# Patient Record
Sex: Male | Born: 1935 | Race: White | Hispanic: No | Marital: Married | Smoking: Former smoker
Health system: Southern US, Community
[De-identification: ages and names within clinical notes are randomized; demographics above are authoritative.]

## PROBLEM LIST (undated history)

## (undated) DIAGNOSIS — K30 Functional dyspepsia: Secondary | ICD-10-CM

## (undated) DIAGNOSIS — H269 Unspecified cataract: Secondary | ICD-10-CM

## (undated) DIAGNOSIS — C801 Malignant (primary) neoplasm, unspecified: Secondary | ICD-10-CM

## (undated) DIAGNOSIS — C61 Malignant neoplasm of prostate: Secondary | ICD-10-CM

## (undated) DIAGNOSIS — M25569 Pain in unspecified knee: Secondary | ICD-10-CM

## (undated) DIAGNOSIS — R32 Unspecified urinary incontinence: Secondary | ICD-10-CM

## (undated) DIAGNOSIS — M25559 Pain in unspecified hip: Secondary | ICD-10-CM

## (undated) DIAGNOSIS — M1990 Unspecified osteoarthritis, unspecified site: Secondary | ICD-10-CM

## (undated) DIAGNOSIS — N529 Male erectile dysfunction, unspecified: Secondary | ICD-10-CM

## (undated) DIAGNOSIS — R609 Edema, unspecified: Secondary | ICD-10-CM

## (undated) DIAGNOSIS — H409 Unspecified glaucoma: Secondary | ICD-10-CM

## (undated) DIAGNOSIS — Z87448 Personal history of other diseases of urinary system: Secondary | ICD-10-CM

## (undated) DIAGNOSIS — F419 Anxiety disorder, unspecified: Secondary | ICD-10-CM

## (undated) HISTORY — DX: Unspecified urinary incontinence: R32

## (undated) HISTORY — DX: Personal history of other diseases of urinary system: Z87.448

## (undated) HISTORY — DX: Male erectile dysfunction, unspecified: N52.9

## (undated) HISTORY — DX: Pain in unspecified knee: M25.569

## (undated) HISTORY — DX: Functional dyspepsia: K30

## (undated) HISTORY — DX: Unspecified cataract: H26.9

## (undated) HISTORY — DX: Anxiety disorder, unspecified: F41.9

## (undated) HISTORY — PX: PROSTATE SURGERY: SHX751

## (undated) HISTORY — DX: Unspecified glaucoma: H40.9

## (undated) HISTORY — DX: Pain in unspecified hip: M25.559

## (undated) HISTORY — DX: Malignant neoplasm of prostate: C61

## (undated) HISTORY — PX: HERNIA REPAIR: SHX51

## (undated) HISTORY — PX: VASECTOMY: SHX75

## (undated) HISTORY — DX: Edema, unspecified: R60.9

---

## 1966-09-25 HISTORY — PX: FRACTURE SURGERY: SHX138

## 1972-09-25 HISTORY — PX: VASECTOMY: SHX75

## 1991-09-26 HISTORY — PX: HERNIA REPAIR: SHX51

## 1995-09-26 HISTORY — PX: SUPRAPUBIC PROSTATECTOMY: SUR1056

## 2007-09-26 DIAGNOSIS — Z9889 Other specified postprocedural states: Secondary | ICD-10-CM

## 2007-09-26 HISTORY — PX: GALLBLADDER SURGERY: SHX652

## 2007-09-26 HISTORY — DX: Other specified postprocedural states: Z98.890

## 2008-03-26 ENCOUNTER — Ambulatory Visit (HOSPITAL_COMMUNITY): Admission: RE | Admit: 2008-03-26 | Discharge: 2008-03-26 | Payer: Self-pay | Admitting: Pulmonary Disease

## 2008-04-10 ENCOUNTER — Ambulatory Visit: Payer: Self-pay | Admitting: Internal Medicine

## 2008-04-20 ENCOUNTER — Encounter: Payer: Self-pay | Admitting: Internal Medicine

## 2008-04-20 ENCOUNTER — Ambulatory Visit: Payer: Self-pay | Admitting: Internal Medicine

## 2008-04-23 ENCOUNTER — Encounter: Payer: Self-pay | Admitting: Internal Medicine

## 2008-07-03 ENCOUNTER — Encounter (INDEPENDENT_AMBULATORY_CARE_PROVIDER_SITE_OTHER): Payer: Self-pay | Admitting: General Surgery

## 2008-07-03 ENCOUNTER — Ambulatory Visit (HOSPITAL_COMMUNITY): Admission: RE | Admit: 2008-07-03 | Discharge: 2008-07-03 | Payer: Self-pay | Admitting: General Surgery

## 2008-07-03 HISTORY — PX: COLONOSCOPY: SHX174

## 2008-07-03 LAB — HM COLONOSCOPY

## 2009-09-25 HISTORY — PX: CHOLECYSTECTOMY: SHX55

## 2011-02-07 NOTE — H&P (Signed)
NAME:  JIMMEY, HENGEL NO.:  [id number]   MEDICAL RECORD NO.:  [id number]          PATIENT TYPE:  AMB   LOCATION:  DAY                           FACILITY:  [facility]   PHYSICIAN:  Dalia Heading, M.D.  DATE OF BIRTH:  Apr 02, 1936   DATE OF ADMISSION:  DATE OF DISCHARGE:  LH                              HISTORY & PHYSICAL   CHIEF COMPLAINT:  Cholecystitis and cholelithiasis.   HISTORY OF PRESENT ILLNESS:  The patient is a 75 year old white male who  is referred for evaluation and treatment of biliary colic secondary to  cholelithiasis.  He has had intermittent episodes of right upper  quadrant abdominal pain with radiation to the flank, nausea, and  indigestion.  He denies any nausea, vomiting, or fatty food intolerance.  No fever, chills, or jaundice have been noted.   PAST MEDICAL HISTORY:  Includes prostate carcinoma.   PAST SURGICAL HISTORY:  Radical prostatectomy and herniorrhaphy.   CURRENT MEDICATIONS:  None.   ALLERGIES:  No known drug allergies.   REVIEW OF SYSTEMS:  Noncontributory.   PHYSICAL EXAMINATION:  GENERAL:  The patient is a well-developed, well-  nourished white male in no acute distress.  HEENT:  Reveals no scleral icterus.  LUNGS:  Clear to auscultation with equal breath sounds bilaterally.  HEART:  Regular rate and rhythm without S3, S4, or murmurs.  ABDOMEN:  Soft, nontender, and nondistended.  No hepatosplenomegaly or  masses are noted.   Ultrasound of the gallbladder reveals cholelithiasis with a normal  common bile duct.   IMPRESSION:  Cholecystitis, cholelithiasis.   PLAN:  The patient is scheduled for laparoscopic cholecystectomy on  July 03, 2008.  The risks and benefits of the procedure including  bleeding, infection, hepatobiliary injury, and the possibility of an  open procedure were fully explained to the patient, who gave informed  consent.      Dalia Heading, M.D.  Electronically Signed     MAJ/MEDQ  D:   06/23/2008  T:  06/24/2008  Job:  [id number]   cc:   Ramon Dredge L. Juanetta Gosling, M.D.  Fax: [phone]

## 2011-02-07 NOTE — Op Note (Signed)
NAME:  Kevin [name], Kevin [name] NO.:  [id number]   MEDICAL RECORD NO.:  [id number]          PATIENT TYPE:  AMB   LOCATION:  DAY                           FACILITY:  [facility]   PHYSICIAN:  Dalia Heading, M.D.  DATE OF BIRTH:  09-Jan-1936   DATE OF PROCEDURE:  07/03/2008  DATE OF DISCHARGE:                               OPERATIVE REPORT   PREOPERATIVE DIAGNOSES:  1. Cholecystitis.  2. Cholelithiasis.   POSTOPERATIVE DIAGNOSES:  1. Cholecystitis.  2. Cholelithiasis.   PROCEDURE:  Laparoscopic cholecystectomy.   SURGEON:  Dalia Heading, MD   ANESTHESIA:  General endotracheal.   INDICATIONS:  The patient is a 75 year old white male who presents with  biliary colic secondary to cholelithiasis.  The risks and benefits of  the procedure including bleeding, infection, hepatobiliary injury, the  possibility of an open procedure were fully explained to the patient,  gave informed consent.   PROCEDURE NOTE:  The patient was placed in a supine position.  After  induction of general endotracheal anesthesia, the abdomen was prepped  and draped using the usual sterile technique with Betadine.  Surgical  site confirmation was performed.   A supraumbilical incision was made down to the fascia.  A Veress needle  was introduced into the abdominal cavity and confirmation of placement  was done using the saline drop test.  The abdomen was then insufflated  to 16 mmHg pressure.  An 11-mm trocar was introduced into the abdominal  cavity under direct visualization without difficulty.  The patient was  placed in reversed Trendelenburg position.  An additional 11-mm trocar  was placed in the epigastric region.  A 5-mm trocar was placed in the  right upper quadrant and right flank regions.  Liver was inspected and  noted to be within normal limits.  The gallbladder was retracted  superior and laterally.  The dissection was begun around the  infundibulum of the gallbladder.  The cystic duct  was first identified.  Its juncture to the infundibulum fully identified.  EndoClip was placed  proximally and distally on the cystic duct and the cystic duct was  divided.  This was likewise done in cystic artery.  The gallbladder was  then freed away from the gallbladder fossa using Bovie electrocautery.  The gallbladder was delivered through the epigastric trocar site using  an EndoCatch bag.  The gallbladder fossa was inspected.  No abnormal  bleeding or bile leakage was noted.  Surgicel was placed in the  gallbladder fossa.  All fluid and air were then evacuated from the  abdominal cavity prior to removal of the trocars.   All wounds were irrigated with warm normal saline.  All wounds were  injected with 0.5% Sensorcaine.  The supraumbilical fascia as well as  epigastric fascia reapproximated using 0 Vicryl interrupted sutures.  All skin incisions were closed using staples.  Betadine ointment and dry  sterile dressings were applied.   All tape and needle counts were correct at the end of the procedure.  The patient was extubated in the operating room and went back to  recovery room awake in stable  condition.   COMPLICATIONS:  None.   SPECIMEN:  Gallbladder.   BLOOD LOSS:  Minimal.      Dalia Heading, M.D.  Electronically Signed     MAJ/MEDQ  D:  07/03/2008  T:  07/03/2008  Job:  [id number]   cc:   Ramon Dredge L. Juanetta Gosling, M.D.  Fax: [phone]

## 2011-06-09 ENCOUNTER — Encounter: Payer: Self-pay | Admitting: Internal Medicine

## 2011-06-27 LAB — HEPATIC FUNCTION PANEL
ALT: 20
Alkaline Phosphatase: 60
Indirect Bilirubin: 1.1 — ABNORMAL HIGH
Total Bilirubin: 1.3 — ABNORMAL HIGH

## 2011-06-27 LAB — BASIC METABOLIC PANEL
CO2: 29
Chloride: 103
GFR calc Af Amer: >60
Potassium: 4.2
Sodium: 140

## 2011-06-27 LAB — CBC
Hemoglobin: 16.4
MCHC: 33.9
MCV: 89.3
RBC: 5.42

## 2011-11-14 ENCOUNTER — Ambulatory Visit (HOSPITAL_COMMUNITY)
Admission: RE | Admit: 2011-11-14 | Discharge: 2011-11-14 | Disposition: A | Discharge status: Home / Self Care | Payer: Medicare Other | Source: Ambulatory Visit | Attending: Pulmonary Disease | Admitting: Pulmonary Disease

## 2011-11-14 ENCOUNTER — Other Ambulatory Visit (HOSPITAL_COMMUNITY): Payer: Self-pay | Admitting: Pulmonary Disease

## 2011-11-14 DIAGNOSIS — R52 Pain, unspecified: Secondary | ICD-10-CM

## 2011-11-14 DIAGNOSIS — M51379 Other intervertebral disc degeneration, lumbosacral region without mention of lumbar back pain or lower extremity pain: Secondary | ICD-10-CM | POA: Insufficient documentation

## 2011-11-14 DIAGNOSIS — M545 Low back pain, unspecified: Secondary | ICD-10-CM | POA: Insufficient documentation

## 2011-11-14 DIAGNOSIS — M5137 Other intervertebral disc degeneration, lumbosacral region: Secondary | ICD-10-CM | POA: Insufficient documentation

## 2012-06-19 ENCOUNTER — Encounter: Payer: Self-pay | Admitting: Internal Medicine

## 2015-09-26 DIAGNOSIS — Z9849 Cataract extraction status, unspecified eye: Secondary | ICD-10-CM

## 2015-09-26 HISTORY — DX: Cataract extraction status, unspecified eye: Z98.49

## 2015-11-09 DIAGNOSIS — H40009 Preglaucoma, unspecified, unspecified eye: Secondary | ICD-10-CM | POA: Diagnosis not present

## 2015-11-09 DIAGNOSIS — H5210 Myopia, unspecified eye: Secondary | ICD-10-CM | POA: Diagnosis not present

## 2015-12-27 DIAGNOSIS — H40013 Open angle with borderline findings, low risk, bilateral: Secondary | ICD-10-CM | POA: Diagnosis not present

## 2015-12-27 DIAGNOSIS — H2513 Age-related nuclear cataract, bilateral: Secondary | ICD-10-CM | POA: Diagnosis not present

## 2015-12-27 DIAGNOSIS — H2511 Age-related nuclear cataract, right eye: Secondary | ICD-10-CM | POA: Diagnosis not present

## 2015-12-27 DIAGNOSIS — H5213 Myopia, bilateral: Secondary | ICD-10-CM | POA: Diagnosis not present

## 2016-01-17 NOTE — Patient Instructions (Signed)
Your procedure is scheduled on: 01/20/2016  Report to [facility] at  640  AM.  Call this number if you have problems the morning of surgery: [phone]   Do not eat food or drink liquids :After Midnight.      Take these medicines the morning of surgery with A SIP OF WATER: none   Do not wear jewelry, make-up or nail polish.  Do not wear lotions, powders, or perfumes. You may wear deodorant.  Do not shave 48 hours prior to surgery.  Do not bring valuables to the hospital.  Contacts, dentures or bridgework may not be worn into surgery.  Leave suitcase in the car. After surgery it may be brought to your room.  For patients admitted to the hospital, checkout time is 11:00 AM the day of discharge.   Patients discharged the day of surgery will not be allowed to drive home.  :     Please read over the following fact sheets that you were given: Coughing and Deep Breathing, Surgical Site Infection Prevention, Anesthesia Post-op Instructions and Care and Recovery After Surgery    Cataract A cataract is a clouding of the lens of the eye. When a lens becomes cloudy, vision is reduced based on the degree and nature of the clouding. Many cataracts reduce vision to some degree. Some cataracts make people more near-sighted as they develop. Other cataracts increase glare. Cataracts that are ignored and become worse can sometimes look white. The white color can be seen through the pupil. CAUSES   Aging. However, cataracts may occur at any age, even in newborns.   Certain drugs.   Trauma to the eye.   Certain diseases such as diabetes.   Specific eye diseases such as chronic inflammation inside the eye or a sudden attack of a rare form of glaucoma.   Inherited or acquired medical problems.  SYMPTOMS   Gradual, progressive drop in vision in the affected eye.   Severe, rapid visual loss. This most often happens when trauma is the cause.  DIAGNOSIS  To detect a cataract, an eye doctor examines  the lens. Cataracts are best diagnosed with an exam of the eyes with the pupils enlarged (dilated) by drops.  TREATMENT  For an early cataract, vision may improve by using different eyeglasses or stronger lighting. If that does not help your vision, surgery is the only effective treatment. A cataract needs to be surgically removed when vision loss interferes with your everyday activities, such as driving, reading, or watching TV. A cataract may also have to be removed if it prevents examination or treatment of another eye problem. Surgery removes the cloudy lens and usually replaces it with a substitute lens (intraocular lens, IOL).  At a time when both you and your doctor agree, the cataract will be surgically removed. If you have cataracts in both eyes, only one is usually removed at a time. This allows the operated eye to heal and be out of danger from any possible problems after surgery (such as infection or poor wound healing). In rare cases, a cataract may be doing damage to your eye. In these cases, your caregiver may advise surgical removal right away. The vast majority of people who have cataract surgery have better vision afterward. HOME CARE INSTRUCTIONS  If you are not planning surgery, you may be asked to do the following:  Use different eyeglasses.   Use stronger or brighter lighting.   Ask your eye doctor about reducing your medicine dose or  changing medicines if it is thought that a medicine caused your cataract. Changing medicines does not make the cataract go away on its own.   Become familiar with your surroundings. Poor vision can lead to injury. Avoid bumping into things on the affected side. You are at a higher risk for tripping or falling.   Exercise extreme care when driving or operating machinery.   Wear sunglasses if you are sensitive to bright light or experiencing problems with glare.  SEEK IMMEDIATE MEDICAL CARE IF:   You have a worsening or sudden vision loss.    You notice redness, swelling, or increasing pain in the eye.   You have a fever.  Document Released: 09/11/2005 Document Revised: 08/31/2011 Document Reviewed: 05/05/2011 [facility] Patient Information 2012 [place].PATIENT INSTRUCTIONS POST-ANESTHESIA  IMMEDIATELY FOLLOWING SURGERY:  Do not drive or operate machinery for the first twenty four hours after surgery.  Do not make any important decisions for twenty four hours after surgery or while taking narcotic pain medications or sedatives.  If you develop intractable nausea and vomiting or a severe headache please notify your doctor immediately.  FOLLOW-UP:  Please make an appointment with your surgeon as instructed. You do not need to follow up with anesthesia unless specifically instructed to do so.  WOUND CARE INSTRUCTIONS (if applicable):  Keep a dry clean dressing on the anesthesia/puncture wound site if there is drainage.  Once the wound has quit draining you may leave it open to air.  Generally you should leave the bandage intact for twenty four hours unless there is drainage.  If the epidural site drains for more than 36-48 hours please call the anesthesia department.  QUESTIONS?:  Please feel free to call your physician or the hospital operator if you have any questions, and they will be happy to assist you.

## 2016-01-18 ENCOUNTER — Encounter (HOSPITAL_COMMUNITY)
Admission: RE | Admit: 2016-01-18 | Discharge: 2016-01-18 | Disposition: A | Discharge status: Home / Self Care | Payer: Medicare HMO | Source: Ambulatory Visit | Attending: Ophthalmology | Admitting: Ophthalmology

## 2016-01-18 ENCOUNTER — Other Ambulatory Visit: Payer: Self-pay

## 2016-01-18 ENCOUNTER — Encounter (HOSPITAL_COMMUNITY): Payer: Self-pay

## 2016-01-18 DIAGNOSIS — Z8546 Personal history of malignant neoplasm of prostate: Secondary | ICD-10-CM | POA: Diagnosis not present

## 2016-01-18 DIAGNOSIS — H2511 Age-related nuclear cataract, right eye: Secondary | ICD-10-CM | POA: Diagnosis not present

## 2016-01-18 DIAGNOSIS — Z01812 Encounter for preprocedural laboratory examination: Secondary | ICD-10-CM | POA: Diagnosis not present

## 2016-01-18 DIAGNOSIS — Z87891 Personal history of nicotine dependence: Secondary | ICD-10-CM | POA: Diagnosis not present

## 2016-01-18 DIAGNOSIS — Z01810 Encounter for preprocedural cardiovascular examination: Secondary | ICD-10-CM | POA: Diagnosis not present

## 2016-01-18 HISTORY — DX: Malignant (primary) neoplasm, unspecified: C80.1

## 2016-01-18 HISTORY — DX: Unspecified osteoarthritis, unspecified site: M19.90

## 2016-01-18 LAB — BASIC METABOLIC PANEL
ANION GAP: 7 (ref 5–15)
BUN: 15 mg/dL (ref 6–20)
CHLORIDE: 104 mmol/L (ref 101–111)
CO2: 27 mmol/L (ref 22–32)
Calcium: 9.3 mg/dL (ref 8.9–10.3)
Creatinine, Ser: 1.12 mg/dL (ref 0.61–1.24)
Glucose, Bld: 107 mg/dL — ABNORMAL HIGH (ref 65–99)
POTASSIUM: 4.2 mmol/L (ref 3.5–5.1)
SODIUM: 138 mmol/L (ref 135–145)

## 2016-01-18 LAB — CBC WITH DIFFERENTIAL/PLATELET
BASOS ABS: 0 10*3/uL (ref 0.0–0.1)
BASOS PCT: 1 %
Eosinophils Absolute: 0.1 10*3/uL (ref 0.0–0.7)
Eosinophils Relative: 1 %
HEMATOCRIT: 45.3 % (ref 39.0–52.0)
HEMOGLOBIN: 16 g/dL (ref 13.0–17.0)
LYMPHS PCT: 35 %
Lymphs Abs: 2.2 10*3/uL (ref 0.7–4.0)
MCH: 31.2 pg (ref 26.0–34.0)
MCHC: 35.3 g/dL (ref 30.0–36.0)
MCV: 88.3 fL (ref 78.0–100.0)
MONO ABS: 0.7 10*3/uL (ref 0.1–1.0)
MONOS PCT: 12 %
NEUTROS ABS: 3.3 10*3/uL (ref 1.7–7.7)
NEUTROS PCT: 52 %
Platelets: 163 10*3/uL (ref 150–400)
RBC: 5.13 MIL/uL (ref 4.22–5.81)
RDW: 12.4 % (ref 11.5–15.5)
WBC: 6.3 10*3/uL (ref 4.0–10.5)

## 2016-01-18 NOTE — Pre-Procedure Instructions (Signed)
EKG shown to Dr Milford Cage. Patient is asymptomatic. No orders given. Patient given information to sign up for my chart at home.

## 2016-01-20 ENCOUNTER — Ambulatory Visit (HOSPITAL_COMMUNITY): Payer: Medicare HMO | Admitting: Anesthesiology

## 2016-01-20 ENCOUNTER — Encounter (HOSPITAL_COMMUNITY): Payer: Self-pay

## 2016-01-20 ENCOUNTER — Encounter (HOSPITAL_COMMUNITY)
Admission: RE | Disposition: A | Discharge status: Home / Self Care | Payer: Self-pay | Source: Ambulatory Visit | Attending: Ophthalmology

## 2016-01-20 ENCOUNTER — Ambulatory Visit (HOSPITAL_COMMUNITY)
Admission: RE | Admit: 2016-01-20 | Discharge: 2016-01-20 | Disposition: A | Discharge status: Home / Self Care | Payer: Medicare HMO | Source: Ambulatory Visit | Attending: Ophthalmology | Admitting: Ophthalmology

## 2016-01-20 DIAGNOSIS — Z01812 Encounter for preprocedural laboratory examination: Secondary | ICD-10-CM | POA: Diagnosis not present

## 2016-01-20 DIAGNOSIS — Z01810 Encounter for preprocedural cardiovascular examination: Secondary | ICD-10-CM | POA: Insufficient documentation

## 2016-01-20 DIAGNOSIS — H2511 Age-related nuclear cataract, right eye: Secondary | ICD-10-CM | POA: Insufficient documentation

## 2016-01-20 DIAGNOSIS — Z8546 Personal history of malignant neoplasm of prostate: Secondary | ICD-10-CM | POA: Insufficient documentation

## 2016-01-20 DIAGNOSIS — Z87891 Personal history of nicotine dependence: Secondary | ICD-10-CM | POA: Insufficient documentation

## 2016-01-20 DIAGNOSIS — H269 Unspecified cataract: Secondary | ICD-10-CM | POA: Diagnosis not present

## 2016-01-20 HISTORY — PX: CATARACT EXTRACTION W/PHACO: SHX586

## 2016-01-20 SURGERY — PHACOEMULSIFICATION, CATARACT, WITH IOL INSERTION
Anesthesia: Monitor Anesthesia Care | Site: Eye | Laterality: Right

## 2016-01-20 MED ORDER — LIDOCAINE HCL 3.5 % OP GEL
1.0000 "application " | Freq: Once | OPHTHALMIC | Status: AC
  Administered 2016-01-20: 1 via OPHTHALMIC

## 2016-01-20 MED ORDER — EPINEPHRINE HCL 1 MG/ML IJ SOLN
INTRAMUSCULAR | Status: AC
  Filled 2016-01-20: qty 1

## 2016-01-20 MED ORDER — FENTANYL CITRATE (PF) 100 MCG/2ML IJ SOLN
25.0000 ug | INTRAMUSCULAR | Status: AC
  Administered 2016-01-20 (×2): 25 ug via INTRAVENOUS

## 2016-01-20 MED ORDER — TETRACAINE HCL 0.5 % OP SOLN
1.0000 [drp] | OPHTHALMIC | Status: AC
  Administered 2016-01-20 (×3): 1 [drp] via OPHTHALMIC

## 2016-01-20 MED ORDER — NEOMYCIN-POLYMYXIN-DEXAMETH 3.5-10000-0.1 OP SUSP
OPHTHALMIC | Status: DC | PRN
  Administered 2016-01-20: 2 [drp] via OPHTHALMIC

## 2016-01-20 MED ORDER — FENTANYL CITRATE (PF) 100 MCG/2ML IJ SOLN
INTRAMUSCULAR | Status: AC
  Filled 2016-01-20: qty 2

## 2016-01-20 MED ORDER — MIDAZOLAM HCL 2 MG/2ML IJ SOLN
INTRAMUSCULAR | Status: AC
  Filled 2016-01-20: qty 2

## 2016-01-20 MED ORDER — LACTATED RINGERS IV SOLN
INTRAVENOUS | Status: DC
  Administered 2016-01-20: 08:00:00 via INTRAVENOUS

## 2016-01-20 MED ORDER — LIDOCAINE HCL (PF) 1 % IJ SOLN
INTRAMUSCULAR | Status: DC | PRN
  Administered 2016-01-20: .6 mL

## 2016-01-20 MED ORDER — CYCLOPENTOLATE-PHENYLEPHRINE 0.2-1 % OP SOLN
1.0000 [drp] | OPHTHALMIC | Status: AC
  Administered 2016-01-20 (×3): 1 [drp] via OPHTHALMIC

## 2016-01-20 MED ORDER — MIDAZOLAM HCL 2 MG/2ML IJ SOLN
1.0000 mg | INTRAMUSCULAR | Status: DC | PRN
  Administered 2016-01-20: 2 mg via INTRAVENOUS

## 2016-01-20 MED ORDER — PHENYLEPHRINE HCL 2.5 % OP SOLN
1.0000 [drp] | OPHTHALMIC | Status: AC
  Administered 2016-01-20 (×3): 1 [drp] via OPHTHALMIC

## 2016-01-20 MED ORDER — BSS IO SOLN
INTRAOCULAR | Status: DC | PRN
  Administered 2016-01-20: 15 mL

## 2016-01-20 MED ORDER — EPINEPHRINE HCL 1 MG/ML IJ SOLN
INTRAOCULAR | Status: DC | PRN
  Administered 2016-01-20: 500 mL

## 2016-01-20 MED ORDER — PROVISC 10 MG/ML IO SOLN
INTRAOCULAR | Status: DC | PRN
  Administered 2016-01-20: 0.85 mL via INTRAOCULAR

## 2016-01-20 MED ORDER — POVIDONE-IODINE 5 % OP SOLN
OPHTHALMIC | Status: DC | PRN
  Administered 2016-01-20: 1 via OPHTHALMIC

## 2016-01-20 SURGICAL SUPPLY — 23 items
CAPSULAR TENSION RING-AMO (OPHTHALMIC RELATED) IMPLANT
CLOTH BEACON ORANGE TIMEOUT ST (SAFETY) ×2 IMPLANT
EYE SHIELD UNIVERSAL CLEAR (GAUZE/BANDAGES/DRESSINGS) ×2 IMPLANT
GLOVE BIOGEL PI IND STRL 7.0 (GLOVE) ×1 IMPLANT
GLOVE BIOGEL PI IND STRL 7.5 (GLOVE) IMPLANT
GLOVE BIOGEL PI INDICATOR 7.0 (GLOVE) ×1
GLOVE BIOGEL PI INDICATOR 7.5 (GLOVE)
GLOVE EXAM NITRILE LRG STRL (GLOVE) IMPLANT
GLOVE EXAM NITRILE MD LF STRL (GLOVE) ×2 IMPLANT
KIT VITRECTOMY (OPHTHALMIC RELATED) IMPLANT
PAD ARMBOARD 7.5X6 YLW CONV (MISCELLANEOUS) ×2 IMPLANT
PROC W NO LENS (INTRAOCULAR LENS)
PROC W SPEC LENS (INTRAOCULAR LENS)
PROCESS W NO LENS (INTRAOCULAR LENS) IMPLANT
PROCESS W SPEC LENS (INTRAOCULAR LENS) IMPLANT
RETRACTOR IRIS SIGHTPATH (OPHTHALMIC RELATED) IMPLANT
RING MALYGIN (MISCELLANEOUS) IMPLANT
SIGHTPATH CAT PROC W REG LENS (Ophthalmic Related) ×2 IMPLANT
SYRINGE LUER LOK 1CC (MISCELLANEOUS) ×2 IMPLANT
TAPE SURG TRANSPORE 1 IN (GAUZE/BANDAGES/DRESSINGS) ×1 IMPLANT
TAPE SURGICAL TRANSPORE 1 IN (GAUZE/BANDAGES/DRESSINGS) ×1
VISCOELASTIC ADDITIONAL (OPHTHALMIC RELATED) IMPLANT
WATER STERILE IRR 250ML POUR (IV SOLUTION) ×2 IMPLANT

## 2016-01-20 NOTE — Discharge Instructions (Signed)

## 2016-01-20 NOTE — Anesthesia Preprocedure Evaluation (Signed)
Anesthesia Evaluation  Patient identified by MRN, date of birth, ID band Patient awake    Reviewed: Allergy & Precautions, NPO status , Patient's Chart, lab work & pertinent test results  Airway Mallampati: II  TM Distance: >3 FB Neck ROM: Full    Dental  (+) Implants   Pulmonary former smoker,    breath sounds clear to auscultation       Cardiovascular negative cardio ROS  + dysrhythmias (PVC's noted on EKG.)  Rhythm:Regular Rate:Normal     Neuro/Psych    GI/Hepatic negative GI ROS,   Endo/Other    Renal/GU      Musculoskeletal   Abdominal   Peds  Hematology   Anesthesia Other Findings Hx prostate cancer  Reproductive/Obstetrics                             Anesthesia Physical Anesthesia Plan  ASA: II  Anesthesia Plan: MAC   Post-op Pain Management:    Induction: Intravenous  Airway Management Planned: Nasal Cannula  Additional Equipment:   Intra-op Plan:   Post-operative Plan:   Informed Consent: I have reviewed the patients History and Physical, chart, labs and discussed the procedure including the risks, benefits and alternatives for the proposed anesthesia with the patient or authorized representative who has indicated his/her understanding and acceptance.     Plan Discussed with:   Anesthesia Plan Comments:         Anesthesia Quick Evaluation

## 2016-01-20 NOTE — Anesthesia Postprocedure Evaluation (Signed)
  Anesthesia Post-op Note  Patient: Kevin [name]  Procedure(s) Performed: Procedure(s) (LRB): CATARACT EXTRACTION PHACO AND INTRAOCULAR LENS PLACEMENT (IOC) (Right)  Patient Location:  Short Stay  Anesthesia Type: MAC  Level of Consciousness: awake  Airway and Oxygen Therapy: Patient Spontanous Breathing  Post-op Pain: none  Post-op Assessment: Post-op Vital signs reviewed, Patient's Cardiovascular Status Stable, Respiratory Function Stable, Patent Airway, No signs of Nausea or vomiting and Pain level controlled  Post-op Vital Signs: Reviewed and stable  Complications: No apparent anesthesia complications

## 2016-01-20 NOTE — Op Note (Signed)
Date of Admission: 01/20/2016  Date of Surgery: 01/20/2016   Pre-Op Dx: Cataract Right Eye  Post-Op Dx: Senile Nuclear Cataract Right  Eye,  Dx Code H25.11  Surgeon: Tonny Branch, M.D.  Assistants: None  Anesthesia: Topical with MAC  Indications: Painless, progressive loss of vision with compromise of daily activities.  Surgery: Cataract Extraction with Intraocular lens Implant Right Eye  Discription: The patient had dilating drops and viscous lidocaine placed into the Right eye in the pre-op holding area. After transfer to the operating room, a time out was performed. The patient was then prepped and draped. Beginning with a 4 degree blade a paracentesis port was made at the surgeon's 2 o'clock position. The anterior chamber was then filled with 1% non-preserved lidocaine. This was followed by filling the anterior chamber with Provisc.  A 2.56mm keratome blade was used to make a clear corneal incision at the temporal limbus.  A bent cystatome needle was used to create a continuous tear capsulotomy. Hydrodissection was performed with balanced salt solution on a Fine canula. The lens nucleus was then removed using the phacoemulsification handpiece. Residual cortex was removed with the I&A handpiece. The anterior chamber and capsular bag were refilled with Provisc. A posterior chamber intraocular lens was placed into the capsular bag with it's injector. The implant was positioned with the Kuglan hook. The Provisc was then removed from the anterior chamber and capsular bag with the I&A handpiece. Stromal hydration of the main incision and paracentesis port was performed with BSS on a Fine canula. The wounds were tested for leak which was negative. The patient tolerated the procedure well. There were no operative complications. The patient was then transferred to the recovery room in stable condition.  Complications: None  Specimen: None  EBL: None  Prosthetic device: Hoya iSert 250, power 14.5 D,  SN [id number].

## 2016-01-20 NOTE — Transfer of Care (Signed)
Immediate Anesthesia Transfer of Care Note  Patient: Kevin [name]  Procedure(s) Performed: Procedure(s) (LRB): CATARACT EXTRACTION PHACO AND INTRAOCULAR LENS PLACEMENT (IOC) (Right)  Patient Location: Shortstay  Anesthesia Type: MAC  Level of Consciousness: awake  Airway & Oxygen Therapy: Patient Spontanous Breathing   Post-op Assessment: Report given to PACU RN, Post -op Vital signs reviewed and stable and Patient moving all extremities  Post vital signs: Reviewed and stable  Complications: No apparent anesthesia complications

## 2016-01-20 NOTE — Anesthesia Procedure Notes (Signed)
Procedure Name: MAC Date/Time: 01/20/2016 8:42 AM Performed by: Vista Deck Pre-anesthesia Checklist: Patient identified, Emergency Drugs available, Suction available, Timeout performed and Patient being monitored Patient Re-evaluated:Patient Re-evaluated prior to inductionOxygen Delivery Method: Nasal Cannula

## 2016-01-20 NOTE — H&P (Signed)
I have reviewed the H&P, the patient was re-examined, and I have identified no interval changes in medical condition and plan of care since the history and physical of record  

## 2016-01-21 ENCOUNTER — Encounter (HOSPITAL_COMMUNITY): Payer: Self-pay | Admitting: Ophthalmology

## 2016-02-07 DIAGNOSIS — H5212 Myopia, left eye: Secondary | ICD-10-CM | POA: Diagnosis not present

## 2016-02-07 DIAGNOSIS — Z961 Presence of intraocular lens: Secondary | ICD-10-CM | POA: Diagnosis not present

## 2016-02-07 DIAGNOSIS — H2512 Age-related nuclear cataract, left eye: Secondary | ICD-10-CM | POA: Diagnosis not present

## 2016-02-28 DIAGNOSIS — R69 Illness, unspecified: Secondary | ICD-10-CM | POA: Diagnosis not present

## 2016-02-29 ENCOUNTER — Encounter (HOSPITAL_COMMUNITY): Payer: Self-pay

## 2016-03-01 ENCOUNTER — Encounter (HOSPITAL_COMMUNITY)
Admission: RE | Admit: 2016-03-01 | Discharge: 2016-03-01 | Disposition: A | Discharge status: Home / Self Care | Payer: Medicare HMO | Source: Ambulatory Visit | Attending: Ophthalmology | Admitting: Ophthalmology

## 2016-03-06 ENCOUNTER — Encounter (HOSPITAL_COMMUNITY)
Admission: RE | Disposition: A | Discharge status: Home / Self Care | Payer: Self-pay | Source: Ambulatory Visit | Attending: Ophthalmology

## 2016-03-06 ENCOUNTER — Encounter (HOSPITAL_COMMUNITY): Payer: Self-pay | Admitting: *Deleted

## 2016-03-06 ENCOUNTER — Ambulatory Visit (HOSPITAL_COMMUNITY): Payer: Medicare HMO | Admitting: Anesthesiology

## 2016-03-06 ENCOUNTER — Ambulatory Visit (HOSPITAL_COMMUNITY)
Admission: RE | Admit: 2016-03-06 | Discharge: 2016-03-06 | Disposition: A | Discharge status: Home / Self Care | Payer: Medicare HMO | Source: Ambulatory Visit | Attending: Ophthalmology | Admitting: Ophthalmology

## 2016-03-06 DIAGNOSIS — Z87891 Personal history of nicotine dependence: Secondary | ICD-10-CM | POA: Diagnosis not present

## 2016-03-06 DIAGNOSIS — H2512 Age-related nuclear cataract, left eye: Secondary | ICD-10-CM | POA: Diagnosis not present

## 2016-03-06 DIAGNOSIS — Z8546 Personal history of malignant neoplasm of prostate: Secondary | ICD-10-CM | POA: Diagnosis not present

## 2016-03-06 DIAGNOSIS — H269 Unspecified cataract: Secondary | ICD-10-CM | POA: Diagnosis not present

## 2016-03-06 HISTORY — PX: CATARACT EXTRACTION W/PHACO: SHX586

## 2016-03-06 SURGERY — PHACOEMULSIFICATION, CATARACT, WITH IOL INSERTION
Anesthesia: Monitor Anesthesia Care | Site: Eye | Laterality: Left

## 2016-03-06 MED ORDER — MIDAZOLAM HCL 2 MG/2ML IJ SOLN
1.0000 mg | INTRAMUSCULAR | Status: DC | PRN
  Administered 2016-03-06: 2 mg via INTRAVENOUS

## 2016-03-06 MED ORDER — FENTANYL CITRATE (PF) 100 MCG/2ML IJ SOLN
25.0000 ug | INTRAMUSCULAR | Status: AC | PRN
  Administered 2016-03-06 (×2): 25 ug via INTRAVENOUS

## 2016-03-06 MED ORDER — FENTANYL CITRATE (PF) 100 MCG/2ML IJ SOLN
INTRAMUSCULAR | Status: AC
  Filled 2016-03-06: qty 2

## 2016-03-06 MED ORDER — TETRACAINE HCL 0.5 % OP SOLN
1.0000 [drp] | OPHTHALMIC | Status: AC
Start: 2016-03-06 — End: 2016-03-06
  Administered 2016-03-06 (×3): 1 [drp] via OPHTHALMIC

## 2016-03-06 MED ORDER — POVIDONE-IODINE 5 % OP SOLN
OPHTHALMIC | Status: DC | PRN
  Administered 2016-03-06: 1 via OPHTHALMIC

## 2016-03-06 MED ORDER — NEOMYCIN-POLYMYXIN-DEXAMETH 3.5-10000-0.1 OP SUSP
OPHTHALMIC | Status: DC | PRN
  Administered 2016-03-06: 2 [drp] via OPHTHALMIC

## 2016-03-06 MED ORDER — EPINEPHRINE HCL 1 MG/ML IJ SOLN
INTRAMUSCULAR | Status: AC
  Filled 2016-03-06: qty 1

## 2016-03-06 MED ORDER — BSS IO SOLN
INTRAOCULAR | Status: DC | PRN
  Administered 2016-03-06: 15 mL via INTRAOCULAR

## 2016-03-06 MED ORDER — CYCLOPENTOLATE-PHENYLEPHRINE 0.2-1 % OP SOLN
1.0000 [drp] | OPHTHALMIC | Status: AC
  Administered 2016-03-06 (×3): 1 [drp] via OPHTHALMIC

## 2016-03-06 MED ORDER — BSS IO SOLN
INTRAOCULAR | Status: DC | PRN
  Administered 2016-03-06: 500 mL

## 2016-03-06 MED ORDER — PROVISC 10 MG/ML IO SOLN
INTRAOCULAR | Status: DC | PRN
  Administered 2016-03-06: 0.85 mL via INTRAOCULAR

## 2016-03-06 MED ORDER — LIDOCAINE 3.5 % OP GEL OPTIME - NO CHARGE
OPHTHALMIC | Status: DC | PRN
  Administered 2016-03-06: 1 [drp] via OPHTHALMIC

## 2016-03-06 MED ORDER — MIDAZOLAM HCL 2 MG/2ML IJ SOLN
INTRAMUSCULAR | Status: AC
  Filled 2016-03-06: qty 2

## 2016-03-06 MED ORDER — LIDOCAINE HCL (PF) 1 % IJ SOLN
INTRAOCULAR | Status: DC | PRN
  Administered 2016-03-06: .6 mL via OPHTHALMIC

## 2016-03-06 MED ORDER — LIDOCAINE HCL 3.5 % OP GEL
1.0000 "application " | Freq: Once | OPHTHALMIC | Status: AC
  Administered 2016-03-06: 1 via OPHTHALMIC

## 2016-03-06 MED ORDER — PHENYLEPHRINE HCL 2.5 % OP SOLN
1.0000 [drp] | OPHTHALMIC | Status: AC
  Administered 2016-03-06 (×3): 1 [drp] via OPHTHALMIC

## 2016-03-06 MED ORDER — LACTATED RINGERS IV SOLN
INTRAVENOUS | Status: DC
  Administered 2016-03-06: 1000 mL via INTRAVENOUS

## 2016-03-06 SURGICAL SUPPLY — 23 items
CAPSULAR TENSION RING-AMO (OPHTHALMIC RELATED) IMPLANT
CLOTH BEACON ORANGE TIMEOUT ST (SAFETY) ×2 IMPLANT
EYE SHIELD UNIVERSAL CLEAR (GAUZE/BANDAGES/DRESSINGS) ×2 IMPLANT
GLOVE BIOGEL PI IND STRL 7.0 (GLOVE) ×1 IMPLANT
GLOVE BIOGEL PI IND STRL 7.5 (GLOVE) IMPLANT
GLOVE BIOGEL PI INDICATOR 7.0 (GLOVE) ×1
GLOVE BIOGEL PI INDICATOR 7.5 (GLOVE)
GLOVE EXAM NITRILE LRG STRL (GLOVE) IMPLANT
GLOVE EXAM NITRILE MD LF STRL (GLOVE) ×2 IMPLANT
KIT VITRECTOMY (OPHTHALMIC RELATED) IMPLANT
PAD ARMBOARD 7.5X6 YLW CONV (MISCELLANEOUS) ×2 IMPLANT
PROC W NO LENS (INTRAOCULAR LENS)
PROC W SPEC LENS (INTRAOCULAR LENS)
PROCESS W NO LENS (INTRAOCULAR LENS) IMPLANT
PROCESS W SPEC LENS (INTRAOCULAR LENS) IMPLANT
RETRACTOR IRIS SIGHTPATH (OPHTHALMIC RELATED) IMPLANT
RING MALYGIN (MISCELLANEOUS) IMPLANT
SIGHTPATH CAT PROC W REG LENS (Ophthalmic Related) ×2 IMPLANT
SYRINGE LUER LOK 1CC (MISCELLANEOUS) ×2 IMPLANT
TAPE SURG TRANSPORE 1 IN (GAUZE/BANDAGES/DRESSINGS) ×1 IMPLANT
TAPE SURGICAL TRANSPORE 1 IN (GAUZE/BANDAGES/DRESSINGS) ×1
VISCOELASTIC ADDITIONAL (OPHTHALMIC RELATED) IMPLANT
WATER STERILE IRR 250ML POUR (IV SOLUTION) ×2 IMPLANT

## 2016-03-06 NOTE — Transfer of Care (Signed)
Immediate Anesthesia Transfer of Care Note  Patient: Kevin [name]  Procedure(s) Performed: Procedure(s): CATARACT EXTRACTION PHACO AND INTRAOCULAR LENS PLACEMENT LEFT EYE;  CDE:  15.81 (Left)  Patient Location: Short Stay  Anesthesia Type:MAC  Level of Consciousness: awake  Airway & Oxygen Therapy: Patient Spontanous Breathing  Post-op Assessment: Report given to RN  Post vital signs: Reviewed  Last Vitals:  Filed Vitals:   03/06/16 1110 03/06/16 1115  BP: 126/72 128/70  Pulse:    Temp:    Resp: 12 12    Last Pain: There were no vitals filed for this visit.    Patients Stated Pain Goal: 8 ([id number])  Complications: No apparent anesthesia complications

## 2016-03-06 NOTE — Anesthesia Postprocedure Evaluation (Signed)
Anesthesia Post Note  Patient: Kevin [name]  Procedure(s) Performed: Procedure(s) (LRB): CATARACT EXTRACTION PHACO AND INTRAOCULAR LENS PLACEMENT LEFT EYE;  CDE:  15.81 (Left)  Patient location during evaluation: Short Stay Anesthesia Type: MAC Level of consciousness: awake and alert and oriented Pain management: pain level controlled Vital Signs Assessment: post-procedure vital signs reviewed and stable Respiratory status: spontaneous breathing Cardiovascular status: blood pressure returned to baseline Postop Assessment: no signs of nausea or vomiting Anesthetic complications: no    Last Vitals:  Filed Vitals:   03/06/16 1110 03/06/16 1115  BP: 126/72 128/70  Pulse:    Temp:    Resp: 12 12    Last Pain: There were no vitals filed for this visit.               [name]

## 2016-03-06 NOTE — Anesthesia Preprocedure Evaluation (Signed)
Anesthesia Evaluation  Patient identified by MRN, date of birth, ID band Patient awake    Reviewed: Allergy & Precautions, NPO status , Patient's Chart, lab work & pertinent test results  Airway Mallampati: II  TM Distance: >3 FB Neck ROM: Full    Dental  (+) Implants   Pulmonary former smoker,    breath sounds clear to auscultation       Cardiovascular negative cardio ROS  + dysrhythmias (PVC's noted on EKG.)  Rhythm:Regular Rate:Normal     Neuro/Psych    GI/Hepatic negative GI ROS,   Endo/Other    Renal/GU      Musculoskeletal   Abdominal   Peds  Hematology   Anesthesia Other Findings Hx prostate cancer  Reproductive/Obstetrics                             Anesthesia Physical Anesthesia Plan  ASA: II  Anesthesia Plan: MAC   Post-op Pain Management:    Induction: Intravenous  Airway Management Planned: Nasal Cannula  Additional Equipment:   Intra-op Plan:   Post-operative Plan:   Informed Consent: I have reviewed the patients History and Physical, chart, labs and discussed the procedure including the risks, benefits and alternatives for the proposed anesthesia with the patient or authorized representative who has indicated his/her understanding and acceptance.     Plan Discussed with:   Anesthesia Plan Comments:         Anesthesia Quick Evaluation

## 2016-03-06 NOTE — Op Note (Signed)
Date of Admission: 03/06/2016  Date of Surgery: 03/06/2016   Pre-Op Dx: Cataract Left Eye  Post-Op Dx: Senile Nuclear Cataract Left  Eye,  Dx Code H25.12  Surgeon: Tonny Branch, M.D.  Assistants: None  Anesthesia: Topical with MAC  Indications: Painless, progressive loss of vision with compromise of daily activities.  Surgery: Cataract Extraction with Intraocular lens Implant Left Eye  Discription: The patient had dilating drops and viscous lidocaine placed into the Left eye in the pre-op holding area. After transfer to the operating room, a time out was performed. The patient was then prepped and draped. Beginning with a 6 degree blade a paracentesis port was made at the surgeon's 2 o'clock position. The anterior chamber was then filled with 1% non-preserved lidocaine with epinepherine. This was followed by filling the anterior chamber with Provisc.  A 2.95mm keratome blade was used to make a clear corneal incision at the temporal limbus.  A bent cystatome needle was used to create a continuous tear capsulotomy. Hydrodissection was performed with balanced salt solution on a Fine canula. The lens nucleus was then removed using the phacoemulsification handpiece. Residual cortex was removed with the I&A handpiece. The anterior chamber and capsular bag were refilled with Provisc. A posterior chamber intraocular lens was placed into the capsular bag with it's injector. The implant was positioned with the Kuglan hook. The Provisc was then removed from the anterior chamber and capsular bag with the I&A handpiece. Stromal hydration of the main incision and paracentesis port was performed with BSS on a Fine canula. The wounds were tested for leak which was negative. The patient tolerated the procedure well. There were no operative complications. The patient was then transferred to the recovery room in stable condition.  Complications: None  Specimen: None  EBL: None  Prosthetic device: Hoya iSert 250,  power 16.0 D, SN [id number].

## 2016-03-06 NOTE — Discharge Instructions (Signed)
Anesthesia, Adult, Care After °Refer to this sheet in the next few weeks. These instructions provide you with information on caring for yourself after your procedure. Your health care provider may also give you more specific instructions. Your treatment has been planned according to current medical practices, but problems sometimes occur. Call your health care provider if you have any problems or questions after your procedure. °WHAT TO EXPECT AFTER THE PROCEDURE °After the procedure, it is typical to experience: °· Sleepiness. °· Nausea and vomiting. °HOME CARE INSTRUCTIONS °· For the first 24 hours after general anesthesia: °¨ Have a responsible person with you. °¨ Do not drive a car. If you are alone, do not take public transportation. °¨ Do not drink alcohol. °¨ Do not take medicine that has not been prescribed by your health care provider. °¨ Do not sign important papers or make important decisions. °¨ You may resume a normal diet and activities as directed by your health care provider. °· Change bandages (dressings) as directed. °· If you have questions or problems that seem related to general anesthesia, call the hospital and ask for the anesthetist or anesthesiologist on call. °SEEK MEDICAL CARE IF: °· You have nausea and vomiting that continue the day after anesthesia. °· You develop a rash. °SEEK IMMEDIATE MEDICAL CARE IF:  °· You have difficulty breathing. °· You have chest pain. °· You have any allergic problems. °  °This information is not intended to replace advice given to you by your health care provider. Make sure you discuss any questions you have with your health care provider. °  °Document Released: 12/18/2000 Document Revised: 10/02/2014 Document Reviewed: 01/10/2012 °[employer]vier Interactive Patient Education ©2016 [employer] ° °

## 2016-03-06 NOTE — H&P (Signed)
I have reviewed the H&P, the patient was re-examined, and I have identified no interval changes in medical condition and plan of care since the history and physical of record  

## 2016-03-07 ENCOUNTER — Encounter (HOSPITAL_COMMUNITY): Payer: Self-pay | Admitting: Ophthalmology

## 2016-07-04 DIAGNOSIS — R69 Illness, unspecified: Secondary | ICD-10-CM | POA: Diagnosis not present

## 2017-01-09 ENCOUNTER — Ambulatory Visit (HOSPITAL_COMMUNITY)
Admission: RE | Admit: 2017-01-09 | Discharge: 2017-01-09 | Disposition: A | Discharge status: Home / Self Care | Payer: Medicare HMO | Source: Ambulatory Visit | Attending: Pulmonary Disease | Admitting: Pulmonary Disease

## 2017-01-09 ENCOUNTER — Other Ambulatory Visit (HOSPITAL_COMMUNITY): Payer: Self-pay | Admitting: Pulmonary Disease

## 2017-01-09 DIAGNOSIS — I7389 Other specified peripheral vascular diseases: Secondary | ICD-10-CM | POA: Diagnosis not present

## 2017-01-09 DIAGNOSIS — M5136 Other intervertebral disc degeneration, lumbar region: Secondary | ICD-10-CM | POA: Insufficient documentation

## 2017-01-09 DIAGNOSIS — M545 Low back pain: Secondary | ICD-10-CM

## 2017-01-09 DIAGNOSIS — M5137 Other intervertebral disc degeneration, lumbosacral region: Secondary | ICD-10-CM | POA: Insufficient documentation

## 2017-01-09 DIAGNOSIS — M25561 Pain in right knee: Secondary | ICD-10-CM | POA: Diagnosis not present

## 2017-01-09 DIAGNOSIS — M25551 Pain in right hip: Secondary | ICD-10-CM

## 2017-01-09 DIAGNOSIS — M25571 Pain in right ankle and joints of right foot: Secondary | ICD-10-CM | POA: Diagnosis not present

## 2017-01-09 LAB — HEPATIC FUNCTION PANEL
ALT: 21 (ref 10–40)
AST: 20 (ref 14–40)
Alkaline Phosphatase: 66 (ref 25–125)

## 2017-01-09 LAB — BASIC METABOLIC PANEL
BUN: 12 (ref 4–21)
CO2: 29 — AB (ref 13–22)
Chloride: 105 (ref 99–108)
Creatinine: 1.2 (ref <=1.3)
Glucose: 96
Potassium: 4.4 (ref 3.4–5.3)
Sodium: 142 (ref 137–147)

## 2017-01-09 LAB — CBC: RBC: 5.24 — AB (ref 3.87–5.11)

## 2017-01-09 LAB — CBC AND DIFFERENTIAL
HCT: 46 (ref 41–53)
Hemoglobin: 15.8 (ref 13.5–17.5)
Neutrophils Absolute: 2597
Platelets: 187 (ref 150–399)
WBC: 4.9

## 2017-01-09 LAB — LIPID PANEL
Cholesterol: 196 (ref 0–200)
HDL: 51 (ref 35–70)
LDL Cholesterol: 127
Triglycerides: 90 (ref 40–160)

## 2017-01-09 LAB — TSH: TSH: 1.92 (ref <=5.90)

## 2017-01-16 LAB — FECAL OCCULT BLOOD, GUAIAC: Fecal Occult Blood: NEGATIVE

## 2017-03-07 DIAGNOSIS — R69 Illness, unspecified: Secondary | ICD-10-CM | POA: Diagnosis not present

## 2017-07-03 DIAGNOSIS — R69 Illness, unspecified: Secondary | ICD-10-CM | POA: Diagnosis not present

## 2017-09-07 DIAGNOSIS — R69 Illness, unspecified: Secondary | ICD-10-CM | POA: Diagnosis not present

## 2018-03-08 DIAGNOSIS — R69 Illness, unspecified: Secondary | ICD-10-CM | POA: Diagnosis not present

## 2018-08-06 DIAGNOSIS — R69 Illness, unspecified: Secondary | ICD-10-CM | POA: Diagnosis not present

## 2018-09-11 DIAGNOSIS — R69 Illness, unspecified: Secondary | ICD-10-CM | POA: Diagnosis not present

## 2019-04-03 DIAGNOSIS — R69 Illness, unspecified: Secondary | ICD-10-CM | POA: Diagnosis not present

## 2019-05-27 DIAGNOSIS — Z0000 Encounter for general adult medical examination without abnormal findings: Secondary | ICD-10-CM | POA: Diagnosis not present

## 2019-05-27 DIAGNOSIS — Z23 Encounter for immunization: Secondary | ICD-10-CM | POA: Diagnosis not present

## 2019-06-03 DIAGNOSIS — Z0000 Encounter for general adult medical examination without abnormal findings: Secondary | ICD-10-CM | POA: Diagnosis not present

## 2019-06-03 DIAGNOSIS — R69 Illness, unspecified: Secondary | ICD-10-CM | POA: Diagnosis not present

## 2019-06-03 DIAGNOSIS — R42 Dizziness and giddiness: Secondary | ICD-10-CM | POA: Diagnosis not present

## 2019-06-03 DIAGNOSIS — R070 Pain in throat: Secondary | ICD-10-CM | POA: Diagnosis not present

## 2019-06-16 ENCOUNTER — Other Ambulatory Visit: Payer: Self-pay | Admitting: Pulmonary Disease

## 2019-06-16 ENCOUNTER — Other Ambulatory Visit (HOSPITAL_COMMUNITY): Payer: Self-pay | Admitting: Pulmonary Disease

## 2019-06-16 DIAGNOSIS — R591 Generalized enlarged lymph nodes: Secondary | ICD-10-CM

## 2019-06-23 ENCOUNTER — Ambulatory Visit (HOSPITAL_COMMUNITY)
Admission: RE | Admit: 2019-06-23 | Discharge: 2019-06-23 | Disposition: A | Discharge status: Home / Self Care | Payer: Medicare HMO | Source: Ambulatory Visit | Attending: Pulmonary Disease | Admitting: Pulmonary Disease

## 2019-06-23 ENCOUNTER — Other Ambulatory Visit: Payer: Self-pay

## 2019-06-23 DIAGNOSIS — R591 Generalized enlarged lymph nodes: Secondary | ICD-10-CM | POA: Insufficient documentation

## 2019-06-23 DIAGNOSIS — R221 Localized swelling, mass and lump, neck: Secondary | ICD-10-CM | POA: Diagnosis not present

## 2019-06-23 DIAGNOSIS — E041 Nontoxic single thyroid nodule: Secondary | ICD-10-CM | POA: Diagnosis not present

## 2019-08-10 DIAGNOSIS — R609 Edema, unspecified: Secondary | ICD-10-CM | POA: Insufficient documentation

## 2019-08-10 DIAGNOSIS — F419 Anxiety disorder, unspecified: Secondary | ICD-10-CM | POA: Insufficient documentation

## 2019-08-10 DIAGNOSIS — M25559 Pain in unspecified hip: Secondary | ICD-10-CM | POA: Insufficient documentation

## 2019-08-10 DIAGNOSIS — M25569 Pain in unspecified knee: Secondary | ICD-10-CM | POA: Insufficient documentation

## 2019-09-04 ENCOUNTER — Other Ambulatory Visit: Payer: Self-pay

## 2019-09-04 ENCOUNTER — Encounter: Payer: Self-pay | Admitting: Family Medicine

## 2019-09-04 ENCOUNTER — Ambulatory Visit (INDEPENDENT_AMBULATORY_CARE_PROVIDER_SITE_OTHER): Payer: Medicare HMO | Admitting: Family Medicine

## 2019-09-04 VITALS — BP 135/69 | HR 54 | Temp 97.5°F | Ht 67.0 in | Wt 183.8 lb

## 2019-09-04 DIAGNOSIS — R1310 Dysphagia, unspecified: Secondary | ICD-10-CM

## 2019-09-04 DIAGNOSIS — R319 Hematuria, unspecified: Secondary | ICD-10-CM | POA: Diagnosis not present

## 2019-09-04 DIAGNOSIS — R079 Chest pain, unspecified: Secondary | ICD-10-CM | POA: Diagnosis not present

## 2019-09-04 DIAGNOSIS — C61 Malignant neoplasm of prostate: Secondary | ICD-10-CM | POA: Diagnosis not present

## 2019-09-04 LAB — POCT URINALYSIS DIP (CLINITEK)
Bilirubin, UA: NEGATIVE
Blood, UA: NEGATIVE
Glucose, UA: NEGATIVE mg/dL
Ketones, POC UA: NEGATIVE mg/dL
Leukocytes, UA: NEGATIVE
Nitrite, UA: NEGATIVE
POC PROTEIN,UA: NEGATIVE
Spec Grav, UA: >=1.03 — AB (ref 1.010–1.025)
Urobilinogen, UA: 0.2 E.U./dL
pH, UA: 5.5 (ref 5.0–8.0)

## 2019-09-04 NOTE — Progress Notes (Signed)
New Patient Office Visit  Subjective:  Patient ID: Kevin [name], male    DOB: December 07, 1935  Age: 83 y.o. MRN: [id number]  CC:  Chief Complaint  Patient presents with  . Establish Care  CP  HPI Kevin [name] presents for prostate CA-blood in urine x 3-last month-no recent urology appt  Constipation-no blood in the stool-uses otc stool softner  CP-noted 3 episodes. Exertion noted prior to pain x1. No radiation. No nausea. Pt states pain a squeezing sensation Pt states after he urinated he felt better.  Pt states no pain with urination.  Urine volume has decreased and takes longer to empty bladder. Pt states specks in urine -first episode then blood. No cp previously. No cardiac work up. Last ecg 2017. No associated neck pain, sob, arm pain  Past Medical History:  Diagnosis Date  . Anxiety disorder, unspecified   . Arthritis   . Cancer [facility])    prostate  . Cataract   . Edema   . Erectile dysfunction   . Glaucoma   . Knee pain   . Pain in unspecified hip   . Prostate cancer ([place])   . Urinary incontinence     Past Surgical History:  Procedure Laterality Date  . CATARACT EXTRACTION W/PHACO Right 01/20/2016   Procedure: CATARACT EXTRACTION PHACO AND INTRAOCULAR LENS PLACEMENT (IOC);  Surgeon: Tonny Branch, MD;  Location: [facility];  Service: Ophthalmology;  Laterality: Right;  CDE 15.42  . CATARACT EXTRACTION W/PHACO Left 03/06/2016   Procedure: CATARACT EXTRACTION PHACO AND INTRAOCULAR LENS PLACEMENT LEFT EYE;  CDE:  15.81;  Surgeon: Tonny Branch, MD;  Location: [facility];  Service: Ophthalmology;  Laterality: Left;  . CHOLECYSTECTOMY  2011  . COLONOSCOPY  07/03/2008  . FRACTURE SURGERY    . HERNIA REPAIR Right   . PROSTATE SURGERY     cancer  . SUPRAPUBIC PROSTATECTOMY  1997  . VASECTOMY      Family History  Problem Relation Age of Onset  . Healthy Mother   . Hypertension Mother   . Heart disease Father   . Heart disease Brother   . Stroke Brother     Social  History   Socioeconomic History  . Marital status: Married    Spouse name: Not on file  . Number of children: Not on file  . Years of education: Not on file  . Highest education level: Not on file  Occupational History  . Occupation: retired  Tobacco Use  . Smoking status: Former Smoker    Packs/day: 0.50    Years: 15.00    Pack years: 7.50    Types: Cigarettes    Quit date: 01/18/2003    Years since quitting: 16.6  Substance and Sexual Activity  . Alcohol use: Yes    Alcohol/week: 1.0 standard drinks    Types: 1 Cans of beer per week    Comment: 1 beer week  . Drug use: No  . Sexual activity: Not Currently    Birth control/protection: None  Other Topics Concern  . Not on file  Social History Narrative  . Not on file   Social Determinants of Health   Financial Resource Strain:   . Difficulty of Paying Living Expenses: Not on file  Food Insecurity:   . Worried About Charity fundraiser in the Last Year: Not on file  . Ran Out of Food in the Last Year: Not on file  Transportation Needs:   . Lack of Transportation (Medical): Not on file  .  Lack of Transportation (Non-Medical): Not on file  Physical Activity:   . Days of Exercise per Week: Not on file  . Minutes of Exercise per Session: Not on file  Stress:   . Feeling of Stress : Not on file  Social Connections:   . Frequency of Communication with Friends and Family: Not on file  . Frequency of Social Gatherings with Friends and Family: Not on file  . Attends Religious Services: Not on file  . Active Member of Clubs or Organizations: Not on file  . Attends Archivist Meetings: Not on file  . Marital Status: Not on file  Intimate Partner Violence:   . Fear of Current or Ex-Partner: Not on file  . Emotionally Abused: Not on file  . Physically Abused: Not on file  . Sexually Abused: Not on file    ROS Review of Systems  Constitutional: Negative.   HENT: Positive for drooling, hearing loss, rhinorrhea,  tinnitus, trouble swallowing and voice change.   Eyes:       [facility]-needs new eye exam-cataract surgery  Respiratory: Negative.   Cardiovascular: Positive for chest pain.       3 episodes  Gastrointestinal: Positive for constipation.  Endocrine: Negative.   Genitourinary: Positive for difficulty urinating and hematuria.       Prostate cancer  Musculoskeletal: Positive for arthralgias, back pain and joint swelling.  Skin: Negative.   Allergic/Immunologic: Positive for environmental allergies.  Neurological: Positive for speech difficulty and headaches.  Hematological: Bruises/bleeds easily.    Objective:   Today's Vitals: BP 135/69 (BP Location: Left Arm, Patient Position: Sitting, Cuff Size: Normal)   Pulse (!) 54   Temp (!) 97.5 F (36.4 C) (Oral)   Ht 5\' 7"  (1.702 m)   Wt 183 lb 12.8 oz (83.4 kg)   SpO2 98%   BMI 28.79 kg/m   Physical Exam Constitutional:      Appearance: Normal appearance.  HENT:     Head: Normocephalic and atraumatic.  Cardiovascular:     Rate and Rhythm: Normal rate and regular rhythm.     Pulses: Normal pulses.     Heart sounds: Normal heart sounds.  Pulmonary:     Effort: Pulmonary effort is normal.     Breath sounds: Normal breath sounds.  Musculoskeletal:     Cervical back: Normal range of motion and neck supple.  Neurological:     Mental Status: He is alert and oriented to person, place, and time.  Psychiatric:        Mood and Affect: Mood normal.        Behavior: Behavior normal.     Assessment & Plan:  1. Prostate cancer ([place]) - POCT URINALYSIS DIP (CLINITEK) - CBC with Differential - COMPLETE METABOLIC PANEL WITH GFR - PSA Urology referral-concern with history for blood noted in urinalysis 2. Chest pain, unspecified type - EKG 12-Lead-reviewed ecg-bradycardia with ST-T wave changes. -cardiology referral for additional evaluation  3. Hematuria, unspecified type Urine psa  4. Dysphagia, unspecified type - Ambulatory  referral to Speech Therapy Difficulty swallowing pills and fluids Outpatient Encounter Medications as of 09/04/2019  Medication Sig  . Magnesium 500 MG CAPS Take 1 capsule by mouth daily.  . Multiple Vitamins-Minerals (CENTRUM SILVER ADULT 50+ PO) Take 1 tablet by mouth daily.  . naproxen sodium (ALEVE) 220 MG tablet Take 220 mg by mouth daily as needed (for joint pain).  . [DISCONTINUED] vitamin B-12 (CYANOCOBALAMIN) 100 MCG tablet Take 100 mcg by mouth daily.  No facility-administered encounter medications on file as of 09/04/2019.    Follow-up: after specialist evaluate  LISA Hannah Beat, MD

## 2019-09-05 LAB — CBC WITH DIFFERENTIAL/PLATELET
Absolute Monocytes: 561 cells/uL (ref 200–950)
Basophils Absolute: 50 cells/uL (ref 0–200)
Basophils Relative: 0.8 %
Eosinophils Absolute: 50 cells/uL (ref 15–500)
Eosinophils Relative: 0.8 %
HCT: 46.8 % (ref 38.5–50.0)
Hemoglobin: 16.2 g/dL (ref 13.2–17.1)
Lymphs Abs: 1890 cells/uL (ref 850–3900)
MCH: 30.2 pg (ref 27.0–33.0)
MCHC: 34.6 g/dL (ref 32.0–36.0)
MCV: 87.2 fL (ref 80.0–100.0)
MPV: 10 fL (ref 7.5–12.5)
Monocytes Relative: 8.9 %
Neutro Abs: 3749 cells/uL (ref 1500–7800)
Neutrophils Relative %: 59.5 %
Platelets: 186 10*3/uL (ref 140–400)
RBC: 5.37 10*6/uL (ref 4.20–5.80)
RDW: 12.5 % (ref 11.0–15.0)
Total Lymphocyte: 30 %
WBC: 6.3 10*3/uL (ref 3.8–10.8)

## 2019-09-05 LAB — COMPLETE METABOLIC PANEL WITH GFR
AG Ratio: 1.9 (calc) (ref 1.0–2.5)
ALT: 19 U/L (ref 9–46)
AST: 21 U/L (ref 10–35)
Albumin: 4.3 g/dL (ref 3.6–5.1)
Alkaline phosphatase (APISO): 66 U/L (ref 35–144)
BUN/Creatinine Ratio: 16 (calc) (ref 6–22)
BUN: 19 mg/dL (ref 7–25)
CO2: 28 mmol/L (ref 20–32)
Calcium: 9.4 mg/dL (ref 8.6–10.3)
Chloride: 103 mmol/L (ref 98–110)
Creat: 1.2 mg/dL — ABNORMAL HIGH (ref 0.70–1.11)
GFR, Est African American: 65 mL/min/{1.73_m2} (ref >=60)
GFR, Est Non African American: 56 mL/min/{1.73_m2} — ABNORMAL LOW (ref >=60)
Globulin: 2.3 g/dL (calc) (ref 1.9–3.7)
Glucose, Bld: 88 mg/dL (ref 65–139)
Potassium: 4.5 mmol/L (ref 3.5–5.3)
Sodium: 141 mmol/L (ref 135–146)
Total Bilirubin: 1 mg/dL (ref 0.2–1.2)
Total Protein: 6.6 g/dL (ref 6.1–8.1)

## 2019-09-05 LAB — PSA: PSA: 0.5 ng/mL (ref <=4.0)

## 2019-09-08 NOTE — Patient Instructions (Signed)
Keep appointment with cardiology, urology and speech

## 2019-09-12 DIAGNOSIS — R69 Illness, unspecified: Secondary | ICD-10-CM | POA: Diagnosis not present

## 2019-09-23 NOTE — Progress Notes (Signed)
CARDIOLOGY CONSULT NOTE       Patient ID: Kevin [name] MRN: [id number] DOB/AGE: Jul 24, 1936 83 y.o.  Admit date: (Not on file) Referring Physician: [place] Primary Physician: Maryruth Hancock, MD Primary Cardiologist: New Reason for Consultation: Chest Pain   Active Problems:   * No active hospital problems. *   HPI:  83 y.o. referred by Dr Holly Bodily for chest pain. History of anxiety , prostate cancer Reviewed note from primary 09/02/19 Complained of 3 episodes of chest pain. Only one exertional SSCP no radiation Described it as squeezing sensation Relief with urination  He also complains of hematuria, dysphagia   Episodes precipitated by mixed alcohol drink All 3 episodes started with this- bladder pain-hematuria and then pain Radiated to chest Has not had any issues last 3 weeks Does not typically get pain in chest with walking   He is retired. Wife has a lot of health issues " and would not be happy if she knew I had alcohol"   ROS All other systems reviewed and negative except as noted above  Past Medical History:  Diagnosis Date  . Anxiety disorder, unspecified   . Arthritis   . Cancer [facility])    prostate  . Cataract   . Edema   . Erectile dysfunction   . Glaucoma   . Knee pain   . Pain in unspecified hip   . Prostate cancer ([place])   . Urinary incontinence     Family History  Problem Relation Age of Onset  . Healthy Mother   . Hypertension Mother   . Heart disease Father   . Heart disease Brother   . Stroke Brother     Social History   Socioeconomic History  . Marital status: Married    Spouse name: Not on file  . Number of children: Not on file  . Years of education: Not on file  . Highest education level: Not on file  Occupational History  . Occupation: retired  Tobacco Use  . Smoking status: Former Smoker    Packs/day: 0.50    Years: 15.00    Pack years: 7.50    Types: Cigarettes    Quit date: 01/18/2003    Years since quitting: 16.7  . Smokeless  tobacco: Never Used  Substance and Sexual Activity  . Alcohol use: Yes    Alcohol/week: 1.0 standard drinks    Types: 1 Cans of beer per week    Comment: 1 beer week  . Drug use: No  . Sexual activity: Not Currently    Birth control/protection: None  Other Topics Concern  . Not on file  Social History Narrative  . Not on file   Social Determinants of Health   Financial Resource Strain:   . Difficulty of Paying Living Expenses: Not on file  Food Insecurity:   . Worried About Charity fundraiser in the Last Year: Not on file  . Ran Out of Food in the Last Year: Not on file  Transportation Needs:   . Lack of Transportation (Medical): Not on file  . Lack of Transportation (Non-Medical): Not on file  Physical Activity:   . Days of Exercise per Week: Not on file  . Minutes of Exercise per Session: Not on file  Stress:   . Feeling of Stress : Not on file  Social Connections:   . Frequency of Communication with Friends and Family: Not on file  . Frequency of Social Gatherings with Friends and Family: Not on file  .  Attends Religious Services: Not on file  . Active Member of Clubs or Organizations: Not on file  . Attends Archivist Meetings: Not on file  . Marital Status: Not on file  Intimate Partner Violence:   . Fear of Current or Ex-Partner: Not on file  . Emotionally Abused: Not on file  . Physically Abused: Not on file  . Sexually Abused: Not on file    Past Surgical History:  Procedure Laterality Date  . CATARACT EXTRACTION W/PHACO Right 01/20/2016   Procedure: CATARACT EXTRACTION PHACO AND INTRAOCULAR LENS PLACEMENT (IOC);  Surgeon: Tonny Branch, MD;  Location: [facility];  Service: Ophthalmology;  Laterality: Right;  CDE 15.42  . CATARACT EXTRACTION W/PHACO Left 03/06/2016   Procedure: CATARACT EXTRACTION PHACO AND INTRAOCULAR LENS PLACEMENT LEFT EYE;  CDE:  15.81;  Surgeon: Tonny Branch, MD;  Location: [facility];  Service: Ophthalmology;  Laterality: Left;  .  CHOLECYSTECTOMY  2011  . COLONOSCOPY  07/03/2008  . FRACTURE SURGERY    . HERNIA REPAIR Right   . PROSTATE SURGERY     cancer  . SUPRAPUBIC PROSTATECTOMY  1997  . VASECTOMY       Current Outpatient Medications:  Marland Kitchen  Magnesium 500 MG CAPS, Take 1 capsule by mouth daily., Disp: , Rfl:  .  Multiple Vitamins-Minerals (CENTRUM SILVER ADULT 50+ PO), Take 1 tablet by mouth daily., Disp: , Rfl:  .  naproxen sodium (ALEVE) 220 MG tablet, Take 220 mg by mouth daily as needed (for joint pain)., Disp: , Rfl:     Physical Exam: Blood pressure (!) 141/74, pulse (!) 56, temperature (!) 96.2 F (35.7 C), temperature source Temporal, height 5\' 8"  (1.727 m), weight 181 lb (82.1 kg), SpO2 98 %.   Affect appropriate Healthy:  appears stated age 50: normal Neck supple with no adenopathy JVP normal no bruits no thyromegaly Lungs clear with no wheezing and good diaphragmatic motion Heart:  S1/S2 no murmur, no rub, gallop or click PMI normal Abdomen: benighn, BS positve, no tenderness, no AAA no bruit.  No HSM or HJR Distal pulses intact with no bruits No edema Neuro non-focal Skin warm and dry No muscular weakness   Labs:   Lab Results  Component Value Date   WBC 6.3 09/04/2019   HGB 16.2 09/04/2019   HCT 46.8 09/04/2019   MCV 87.2 09/04/2019   PLT 186 09/04/2019   No results for input(s): NA, K, CL, CO2, BUN, CREATININE, CALCIUM, PROT, BILITOT, ALKPHOS, ALT, AST, GLUCOSE in the last 168 hours.  Invalid input(s): LABALBU No results found for: CKTOTAL, CKMB, CKMBINDEX, TROPONINI  Lab Results  Component Value Date   CHOL 196 01/09/2017   Lab Results  Component Value Date   HDL 51 01/09/2017   Lab Results  Component Value Date   LDLCALC 127 01/09/2017   Lab Results  Component Value Date   TRIG 90 01/09/2017   No results found for: CHOLHDL No results found for: LDLDIRECT    Radiology: No results found.  EKG: SB rate 54 artifact motion in lateral leads    ASSESSMENT  AND PLAN:   1. Chest pain:  Atypical but given age will order lexiscan myovue  2.  Prostate CA: with recurrent hematuria has been referred to urology PSA normal 09/04/19 urine negative  3. Dysphagia:  Has been referred to GI   Signed: Jenkins Rouge 10/01/2019, 9:32 AM

## 2019-10-01 ENCOUNTER — Encounter: Payer: Self-pay | Admitting: Cardiovascular Disease

## 2019-10-01 ENCOUNTER — Ambulatory Visit: Payer: Medicare HMO | Admitting: Cardiovascular Disease

## 2019-10-01 ENCOUNTER — Encounter: Payer: Self-pay | Admitting: *Deleted

## 2019-10-01 ENCOUNTER — Other Ambulatory Visit: Payer: Self-pay

## 2019-10-01 DIAGNOSIS — R079 Chest pain, unspecified: Secondary | ICD-10-CM | POA: Diagnosis not present

## 2019-10-01 NOTE — Patient Instructions (Signed)
Medication Instructions:  Your physician recommends that you continue on your current medications as directed. Please refer to the Current Medication list given to you today.  *If you need a refill on your cardiac medications before your next appointment, please call your pharmacy*  Lab Work: NONE  If you have labs (blood work) drawn today and your tests are completely normal, you will receive your results only by: Marland Kitchen MyChart Message (if you have MyChart) OR . A paper copy in the mail If you have any lab test that is abnormal or we need to change your treatment, we will call you to review the results.  Testing/Procedures: Your physician has requested that you have a lexiscan myoview. For further information please visit [email or web address]. Please follow instruction sheet, as given.    Follow-Up: At [facility], you and your health needs are our priority.  As part of our continuing mission to provide you with exceptional heart care, we have created designated Provider Care Teams.  These Care Teams include your primary Cardiologist (physician) and Advanced Practice Providers (APPs -  Physician Assistants and Nurse Practitioners) who all work together to provide you with the care you need, when you need it.  Your next appointment:    As Needed   The format for your next appointment:   In Person  Provider:   Jenkins Rouge, MD  Other Instructions Thank you for choosing [facility]!

## 2019-10-03 ENCOUNTER — Encounter (HOSPITAL_COMMUNITY)
Admission: RE | Admit: 2019-10-03 | Discharge: 2019-10-03 | Disposition: A | Discharge status: Home / Self Care | Payer: Medicare HMO | Source: Ambulatory Visit | Attending: Cardiovascular Disease | Admitting: Cardiovascular Disease

## 2019-10-03 ENCOUNTER — Encounter (HOSPITAL_BASED_OUTPATIENT_CLINIC_OR_DEPARTMENT_OTHER)
Admission: RE | Admit: 2019-10-03 | Discharge: 2019-10-03 | Disposition: A | Discharge status: Home / Self Care | Payer: Medicare HMO | Source: Ambulatory Visit | Attending: Cardiovascular Disease | Admitting: Cardiovascular Disease

## 2019-10-03 ENCOUNTER — Other Ambulatory Visit: Payer: Self-pay

## 2019-10-03 ENCOUNTER — Encounter (HOSPITAL_COMMUNITY): Payer: Self-pay

## 2019-10-03 DIAGNOSIS — R079 Chest pain, unspecified: Secondary | ICD-10-CM

## 2019-10-03 LAB — NM MYOCAR MULTI W/SPECT W/WALL MOTION / EF
LV dias vol: 78 mL (ref 62–150)
LV sys vol: 32 mL
Peak HR: 66 {beats}/min
RATE: 0.36
Rest HR: 55 {beats}/min
SDS: 4
SRS: 1
SSS: 5
TID: 1.28

## 2019-10-03 MED ORDER — TECHNETIUM TC 99M TETROFOSMIN IV KIT
10.0000 | PACK | Freq: Once | INTRAVENOUS | Status: AC | PRN
  Administered 2019-10-03: 11 via INTRAVENOUS

## 2019-10-03 MED ORDER — TECHNETIUM TC 99M TETROFOSMIN IV KIT
30.0000 | PACK | Freq: Once | INTRAVENOUS | Status: AC | PRN
  Administered 2019-10-03: 29.8 via INTRAVENOUS

## 2019-10-03 MED ORDER — REGADENOSON 0.4 MG/5ML IV SOLN
INTRAVENOUS | Status: AC
  Administered 2019-10-03: 0.4 mg via INTRAVENOUS
  Filled 2019-10-03: qty 5

## 2019-10-03 MED ORDER — SODIUM CHLORIDE FLUSH 0.9 % IV SOLN
INTRAVENOUS | Status: AC
  Administered 2019-10-03: 10 mL via INTRAVENOUS
  Filled 2019-10-03: qty 10

## 2019-10-13 DIAGNOSIS — R69 Illness, unspecified: Secondary | ICD-10-CM | POA: Diagnosis not present

## 2019-11-13 NOTE — Progress Notes (Signed)
Subjective: 1. History of prostate cancer   2. Gross hematuria      Kevin [name] is an 84 yo WM who is sent back in consultation by Dr. Holly Bodily for an episode of gross hematuria in November.   He had blood on 3 voids with dysuria.  He has not seen it since.   He has no history of stones or UTI's.  He had some low back pain at that time as well.  He has a history of prostate cancer.  He had a radical prostatectomy in about 1997 and was last seen by me in 2002.   His recent PSA was 0.5.  The last I had recorded was 0.03.   He has had postop SUI and ED.   His UA was clear on 09/04/19 and today.   His Cr was 1.2 on 09/04/19.  He has moderate to severe LUTS with an IPSS of 22.  He wears about 3ppd.  IPSS    Row Name 11/14/19 1400         International Prostate Symptom Score   How often have you had the sensation of not emptying your bladder?  Less than half the time     How often have you had to urinate less than every two hours?  Less than half the time     How often have you found you stopped and started again several times when you urinated?  About half the time     How often have you found it difficult to postpone urination?  Almost always     How often have you had a weak urinary stream?  More than half the time     How often have you had to strain to start urination?  More than half the time     How many times did you typically get up at night to urinate?  2 Times     Total IPSS Score  22       Quality of Life due to urinary symptoms   If you were to spend the rest of your life with your urinary condition just the way it is now how would you feel about that?  Mixed        ROS:  Review of Systems  Cardiovascular: Positive for leg swelling.  Gastrointestinal: Positive for heartburn.  Genitourinary: Positive for frequency and urgency.  Musculoskeletal: Positive for joint pain.  Neurological: Positive for headaches.  Endo/Heme/Allergies: Bruises/bleeds easily.    No Known  Allergies  Past Medical History:  Diagnosis Date  . Anxiety disorder, unspecified   . Arthritis   . Cancer [facility])    prostate  . Cataract   . Edema   . Erectile dysfunction   . Glaucoma   . Indigestion   . Knee pain   . Pain in unspecified hip   . Prostate cancer ([place])   . Urinary incontinence     Past Surgical History:  Procedure Laterality Date  . CATARACT EXTRACTION W/PHACO Right 01/20/2016   Procedure: CATARACT EXTRACTION PHACO AND INTRAOCULAR LENS PLACEMENT (IOC);  Surgeon: Tonny Branch, MD;  Location: [facility];  Service: Ophthalmology;  Laterality: Right;  CDE 15.42  . CATARACT EXTRACTION W/PHACO Left 03/06/2016   Procedure: CATARACT EXTRACTION PHACO AND INTRAOCULAR LENS PLACEMENT LEFT EYE;  CDE:  15.81;  Surgeon: Tonny Branch, MD;  Location: [facility];  Service: Ophthalmology;  Laterality: Left;  . CHOLECYSTECTOMY  2011  . COLONOSCOPY  07/03/2008  . FRACTURE SURGERY    .  HERNIA REPAIR Right   . PROSTATE SURGERY     cancer  . SUPRAPUBIC PROSTATECTOMY  1997  . VASECTOMY      Social History   Socioeconomic History  . Marital status: Married    Spouse name: Not on file  . Number of children: 3  . Years of education: Not on file  . Highest education level: Not on file  Occupational History  . Occupation: retired  Tobacco Use  . Smoking status: Former Smoker    Packs/day: 0.50    Years: 15.00    Pack years: 7.50    Types: Cigarettes, Cigars    Quit date: 01/18/2004    Years since quitting: 15.8  . Smokeless tobacco: Never Used  Substance and Sexual Activity  . Alcohol use: Yes    Alcohol/week: 1.0 standard drinks    Types: 1 Cans of beer per week    Comment: 1 beer week  . Drug use: No  . Sexual activity: Not Currently    Birth control/protection: None  Other Topics Concern  . Not on file  Social History Narrative  . Not on file   Social Determinants of Health   Financial Resource Strain:   . Difficulty of Paying Living Expenses: Not on file  Food  Insecurity:   . Worried About Charity fundraiser in the Last Year: Not on file  . Ran Out of Food in the Last Year: Not on file  Transportation Needs:   . Lack of Transportation (Medical): Not on file  . Lack of Transportation (Non-Medical): Not on file  Physical Activity:   . Days of Exercise per Week: Not on file  . Minutes of Exercise per Session: Not on file  Stress:   . Feeling of Stress : Not on file  Social Connections:   . Frequency of Communication with Friends and Family: Not on file  . Frequency of Social Gatherings with Friends and Family: Not on file  . Attends Religious Services: Not on file  . Active Member of Clubs or Organizations: Not on file  . Attends Archivist Meetings: Not on file  . Marital Status: Not on file  Intimate Partner Violence:   . Fear of Current or Ex-Partner: Not on file  . Emotionally Abused: Not on file  . Physically Abused: Not on file  . Sexually Abused: Not on file    Family History  Problem Relation Age of Onset  . Healthy Mother   . Hypertension Mother   . Dementia Mother   . Heart disease Father   . Heart disease Brother   . Stroke Brother     Anti-infectives: Anti-infectives (From admission, onward)   None      Current Outpatient Medications  Medication Sig Dispense Refill  . Magnesium 500 MG CAPS Take 1 capsule by mouth daily.    . Multiple Vitamins-Minerals (CENTRUM SILVER ADULT 50+ PO) Take 1 tablet by mouth daily.    . naproxen sodium (ALEVE) 220 MG tablet Take 220 mg by mouth daily as needed (for joint pain).     No current facility-administered medications for this visit.     Objective: BP (!) 165/66   Pulse (!) 53   Temp (!) 96.8 F (36 C)   Ht 5\' 7"  (1.702 m)   Wt 180 lb (81.6 kg)   BMI 28.19 kg/m    Physical Exam  Lab Results:  Results for orders placed or performed in visit on 11/14/19 (from the past 24 hour(s))  POCT urinalysis dipstick     Status: None   Collection Time: 11/14/19   2:05 PM  Result Value Ref Range   Color, UA yellow    Clarity, UA     Glucose, UA Negative Negative   Bilirubin, UA neg    Ketones, UA neg    Spec Grav, UA 1.010 1.010 - 1.025   Blood, UA neg    pH, UA 5.0 5.0 - 8.0   Protein, UA Negative Negative   Urobilinogen, UA 0.2 0.2 or 1.0 E.U./dL   Nitrite, UA neg    Leukocytes, UA Negative Negative   Appearance clear    Odor      BMET No results for input(s): NA, K, CL, CO2, GLUCOSE, BUN, CREATININE, CALCIUM in the last 72 hours. PT/INR No results for input(s): LABPROT, INR in the last 72 hours. ABG No results for input(s): PHART, HCO3 in the last 72 hours.  Invalid input(s): PCO2, PO2  Studies/Results: No results found.   Assessment/Plan: Gross hematuria.    I am going to have him return with a CT hematuria w/u for possible cystoscopy.  Prostate cancer with rising PSA s/p prostatectomy.   He is over 20 years out from surgery but his PSA is detectible.  I will repeat the PSA along with a testosterone level today to get a better handle on the PSA kinetics.   No orders of the defined types were placed in this encounter.    Orders Placed This Encounter  Procedures  . CT HEMATURIA WORKUP    Standing Status:   Future    Standing Expiration Date:   11/13/2020    Order Specific Question:   Reason for Exam (SYMPTOM  OR DIAGNOSIS REQUIRED)    Answer:   gross hematuria    Order Specific Question:   Preferred imaging location?    Answer:   [facility]    Order Specific Question:   Radiology Contrast Protocol - do NOT remove file path    Answer:   [email or web address]  . PSA    Standing Status:   Future    Standing Expiration Date:   11/13/2020  . Testosterone    Standing Status:   Future    Standing Expiration Date:   11/13/2020  . Basic metabolic panel    Standing Status:   Future    Standing Expiration Date:   11/13/2020  . POCT urinalysis dipstick     Return in about 1 week (around 11/21/2019)  for Next available with results of CT for cystoscopy. .    CC: Dr. Benny Lennert.      [name] 11/14/2019 [phone]

## 2019-11-14 ENCOUNTER — Encounter: Payer: Self-pay | Admitting: Urology

## 2019-11-14 ENCOUNTER — Other Ambulatory Visit: Payer: Self-pay | Admitting: Urology

## 2019-11-14 ENCOUNTER — Other Ambulatory Visit: Payer: Self-pay

## 2019-11-14 ENCOUNTER — Ambulatory Visit (INDEPENDENT_AMBULATORY_CARE_PROVIDER_SITE_OTHER): Payer: Medicare HMO | Admitting: Urology

## 2019-11-14 VITALS — BP 165/66 | HR 53 | Temp 96.8°F | Ht 67.0 in | Wt 180.0 lb

## 2019-11-14 DIAGNOSIS — Z8546 Personal history of malignant neoplasm of prostate: Secondary | ICD-10-CM

## 2019-11-14 DIAGNOSIS — R310 Gross hematuria: Secondary | ICD-10-CM

## 2019-11-14 LAB — POCT URINALYSIS DIPSTICK
Bilirubin, UA: NEGATIVE
Blood, UA: NEGATIVE
Glucose, UA: NEGATIVE
Ketones, UA: NEGATIVE
Leukocytes, UA: NEGATIVE
Nitrite, UA: NEGATIVE
Protein, UA: NEGATIVE
Spec Grav, UA: 1.01 (ref 1.010–1.025)
Urobilinogen, UA: 0.2 E.U./dL
pH, UA: 5 (ref 5.0–8.0)

## 2019-11-15 LAB — BASIC METABOLIC PANEL
BUN/Creatinine Ratio: 13 (calc) (ref 6–22)
BUN: 15 mg/dL (ref 7–25)
CO2: 27 mmol/L (ref 20–32)
Calcium: 9 mg/dL (ref 8.6–10.3)
Chloride: 103 mmol/L (ref 98–110)
Creat: 1.13 mg/dL — ABNORMAL HIGH (ref 0.70–1.11)
Glucose, Bld: 79 mg/dL (ref 65–99)
Potassium: 4.2 mmol/L (ref 3.5–5.3)
Sodium: 139 mmol/L (ref 135–146)

## 2019-11-15 LAB — PSA: PSA: 0.4 ng/mL (ref <=4.0)

## 2019-11-15 LAB — TESTOSTERONE: Testosterone: 437 ng/dL (ref 250–827)

## 2019-12-19 ENCOUNTER — Other Ambulatory Visit: Payer: Self-pay

## 2019-12-19 ENCOUNTER — Telehealth: Payer: Self-pay

## 2019-12-19 ENCOUNTER — Ambulatory Visit (HOSPITAL_COMMUNITY)
Admission: RE | Admit: 2019-12-19 | Discharge: 2019-12-19 | Disposition: A | Discharge status: Home / Self Care | Payer: Medicare HMO | Source: Ambulatory Visit | Attending: Urology | Admitting: Urology

## 2019-12-19 DIAGNOSIS — R310 Gross hematuria: Secondary | ICD-10-CM | POA: Diagnosis not present

## 2019-12-19 MED ORDER — IOHEXOL 300 MG/ML  SOLN
125.0000 mL | Freq: Once | INTRAMUSCULAR | Status: AC | PRN
  Administered 2019-12-19: 10:00:00 125 mL via INTRAVENOUS

## 2019-12-19 NOTE — Telephone Encounter (Signed)
-----   Message from [name], MD sent at 12/19/2019  3:34 PM EDT ----- No obvious cause of hematuria noted.  F/U as planned.  ----- Message ----- From: Dorisann Frames, RN Sent: 12/19/2019   1:54 PM EDT To: [name], MD  Please review

## 2019-12-19 NOTE — Telephone Encounter (Signed)
Pt.notified

## 2020-01-16 ENCOUNTER — Ambulatory Visit: Payer: Medicare HMO | Admitting: Urology

## 2020-01-16 ENCOUNTER — Other Ambulatory Visit: Payer: Self-pay

## 2020-01-16 VITALS — BP 157/78 | HR 51 | Temp 97.3°F | Ht 67.0 in | Wt 185.0 lb

## 2020-01-16 DIAGNOSIS — R310 Gross hematuria: Secondary | ICD-10-CM

## 2020-01-16 DIAGNOSIS — Z8546 Personal history of malignant neoplasm of prostate: Secondary | ICD-10-CM

## 2020-01-16 LAB — POCT URINALYSIS DIPSTICK
Bilirubin, UA: NEGATIVE
Blood, UA: NEGATIVE
Glucose, UA: NEGATIVE
Ketones, UA: NEGATIVE
Leukocytes, UA: NEGATIVE
Nitrite, UA: NEGATIVE
Protein, UA: NEGATIVE
Spec Grav, UA: 1.015 (ref 1.010–1.025)
Urobilinogen, UA: NEGATIVE E.U./dL — AB
pH, UA: 5 (ref 5.0–8.0)

## 2020-01-16 MED ORDER — CIPROFLOXACIN HCL 500 MG PO TABS
500.0000 mg | ORAL_TABLET | Freq: Once | ORAL | Status: AC
  Administered 2020-01-16: 12:00:00 500 mg via ORAL

## 2020-01-16 NOTE — Progress Notes (Signed)
Subjective: 1. Gross hematuria   2. History of prostate cancer      Kevin [name] is an 84 yo WM who is sent back in consultation by Dr. Holly Bodily for an episode of gross hematuria in November.   He had blood on 3 voids with dysuria.  He has not seen it since.   He has no history of stones or UTI's.  He had some low back pain at that time as well.  He has a history of prostate cancer.  He had a radical prostatectomy in about 1997 and was last seen by me in 2002.   His recent PSA was down to 0.4 from  0.5.    He has had postop SUI and ED.   His UA was clear on 09/04/19 and today.   His Cr was 1.2 on 09/04/19.  He has moderate to severe LUTS with an IPSS of 22.  He wears about 3ppd.    ROS:  Review of Systems  All other systems reviewed and are negative.   No Known Allergies  Past Medical History:  Diagnosis Date  . Anxiety disorder, unspecified   . Arthritis   . Cancer [facility])    prostate  . Cataract   . Edema   . Erectile dysfunction   . Glaucoma   . Indigestion   . Knee pain   . Pain in unspecified hip   . Prostate cancer ([place])   . Urinary incontinence     Past Surgical History:  Procedure Laterality Date  . CATARACT EXTRACTION W/PHACO Right 01/20/2016   Procedure: CATARACT EXTRACTION PHACO AND INTRAOCULAR LENS PLACEMENT (IOC);  Surgeon: Tonny Branch, MD;  Location: [facility];  Service: Ophthalmology;  Laterality: Right;  CDE 15.42  . CATARACT EXTRACTION W/PHACO Left 03/06/2016   Procedure: CATARACT EXTRACTION PHACO AND INTRAOCULAR LENS PLACEMENT LEFT EYE;  CDE:  15.81;  Surgeon: Tonny Branch, MD;  Location: [facility];  Service: Ophthalmology;  Laterality: Left;  . CHOLECYSTECTOMY  2011  . COLONOSCOPY  07/03/2008  . FRACTURE SURGERY    . HERNIA REPAIR Right   . PROSTATE SURGERY     cancer  . SUPRAPUBIC PROSTATECTOMY  1997  . VASECTOMY      Social History   Socioeconomic History  . Marital status: Married    Spouse name: Not on file  . Number of children: 3  . Years of  education: Not on file  . Highest education level: Not on file  Occupational History  . Occupation: retired  Tobacco Use  . Smoking status: Former Smoker    Packs/day: 0.50    Years: 15.00    Pack years: 7.50    Types: Cigarettes, Cigars    Quit date: 01/18/2004    Years since quitting: 16.0  . Smokeless tobacco: Never Used  Substance and Sexual Activity  . Alcohol use: Yes    Alcohol/week: 1.0 standard drinks    Types: 1 Cans of beer per week    Comment: 1 beer week  . Drug use: No  . Sexual activity: Not Currently    Birth control/protection: None  Other Topics Concern  . Not on file  Social History Narrative  . Not on file   Social Determinants of Health   Financial Resource Strain:   . Difficulty of Paying Living Expenses:   Food Insecurity:   . Worried About Charity fundraiser in the Last Year:   . Arboriculturist in the Last Year:   Transportation Needs:   .  Lack of Transportation (Medical):   Marland Kitchen Lack of Transportation (Non-Medical):   Physical Activity:   . Days of Exercise per Week:   . Minutes of Exercise per Session:   Stress:   . Feeling of Stress :   Social Connections:   . Frequency of Communication with Friends and Family:   . Frequency of Social Gatherings with Friends and Family:   . Attends Religious Services:   . Active Member of Clubs or Organizations:   . Attends Archivist Meetings:   Marland Kitchen Marital Status:   Intimate Partner Violence:   . Fear of Current or Ex-Partner:   . Emotionally Abused:   Marland Kitchen Physically Abused:   . Sexually Abused:     Family History  Problem Relation Age of Onset  . Healthy Mother   . Hypertension Mother   . Dementia Mother   . Heart disease Father   . Heart disease Brother   . Stroke Brother     Anti-infectives: Anti-infectives (From admission, onward)   Start     Dose/Rate Route Frequency Ordered Stop   01/16/20 1130  CIPROFLOXACIN HCL 500 MG PO TABS     500 mg Oral  Once 01/16/20 1127 01/16/20 1158       Current Outpatient Medications  Medication Sig Dispense Refill  . Magnesium 500 MG CAPS Take 1 capsule by mouth daily.    . Multiple Vitamins-Minerals (CENTRUM SILVER ADULT 50+ PO) Take 1 tablet by mouth daily.    . naproxen sodium (ALEVE) 220 MG tablet Take 220 mg by mouth daily as needed (for joint pain).     No current facility-administered medications for this visit.     Objective: BP (!) 157/78   Pulse (!) 51   Temp (!) 97.3 F (36.3 C)   Ht 5\' 7"  (1.702 m)   Wt 185 lb (83.9 kg)   BMI 28.98 kg/m    Physical Exam  Lab Results:  Results for orders placed or performed in visit on 01/16/20 (from the past 24 hour(s))  POCT urinalysis dipstick     Status: Abnormal   Collection Time: 01/16/20 11:35 AM  Result Value Ref Range   Color, UA yellow    Clarity, UA     Glucose, UA Negative Negative   Bilirubin, UA neg    Ketones, UA neg    Spec Grav, UA 1.015 1.010 - 1.025   Blood, UA neg    pH, UA 5.0 5.0 - 8.0   Protein, UA Negative Negative   Urobilinogen, UA negative (A) 0.2 or 1.0 E.U./dL   Nitrite, UA neg    Leukocytes, UA Negative Negative   Appearance clear    Odor      BMET No results for input(s): NA, K, CL, CO2, GLUCOSE, BUN, CREATININE, CALCIUM in the last 72 hours. PT/INR No results for input(s): LABPROT, INR in the last 72 hours. ABG No results for input(s): PHART, HCO3 in the last 72 hours.  Invalid input(s): PCO2, PO2  Studies/Results: CLINICAL DATA:  Gross hematuria. History of prostate cancer. Status post prostatectomy.  EXAM: CT ABDOMEN AND PELVIS WITHOUT AND WITH CONTRAST  TECHNIQUE: Multidetector CT imaging of the abdomen and pelvis was performed following the standard protocol before and following the bolus administration of intravenous contrast.  CONTRAST:  185mL OMNIPAQUE IOHEXOL 300 MG/ML  SOLN  COMPARISON:  None.  FINDINGS: Lower chest: The lung bases are clear of acute process. No pleural effusion or pulmonary  lesions. The heart is normal in size.  No pericardial effusion. Aortic and coronary artery calcifications are noted. The distal esophagus and aorta are unremarkable.  Hepatobiliary: Simple left hepatic lobe cyst is noted. No worrisome hepatic lesions or intrahepatic biliary dilatation. The gallbladder is surgically absent. No common bile duct dilatation.  Pancreas: No mass, inflammation or ductal dilatation. Age related pancreatic atrophy.  Spleen: Normal size. No focal lesions.  Adrenals/Urinary Tract: The adrenal glands are normal.  No renal, ureteral or bladder calculi or mass. Both kidneys demonstrate normal enhancement/perfusion. No worrisome renal lesions. The delayed images do not demonstrate any significant collecting system abnormalities. Both ureters are normal.  The bladder is unremarkable. No bladder mass or asymmetric bladder wall thickening.  Stomach/Bowel: The stomach, duodenum, small bowel and colon are grossly normal without oral contrast. No inflammatory changes, mass lesions or obstructive findings. The appendix is normal. Advanced sigmoid colon diverticulosis but no findings for acute diverticulitis.  Vascular/Lymphatic: Moderate atherosclerotic calcifications involving the aorta and iliac arteries and branch vessels but no aneurysm or dissection. The major venous structures are patent.  No mesenteric or retroperitoneal mass or adenopathy. No pelvic adenopathy.  Reproductive: The prostate gland is surgically absent. No findings for recurrent tumor. No pelvic adenopathy.  Other: No inguinal mass, adenopathy or hernia.  Musculoskeletal: No significant bony findings. No lytic or sclerotic bone lesions to suggest metastatic disease.  IMPRESSION: 1. No CT findings to account for the patient's hematuria. No renal, ureteral or bladder calculi or mass. 2. No acute abdominal/pelvic findings, mass lesions or lymphadenopathy. 3. Status post  prostatectomy. No findings for recurrent tumor or metastatic disease.  Aortic Atherosclerosis (ICD10-I70.0).   Electronically Signed   By: Marijo Sanes M.D.   On: 12/19/2019 13:33  Procedure: Cystoscopy.  He was prepped with betadine and the urethra was instilled with lidocaine jelly.  He was given po cipro.  Cystoscopy with the flexible scope demonstrated a normal urethra.  The external sphincter was intact.  The prostate was absent.  There is no bladder neck contracture.  The bladder wall has mild trabeculation.  No mucosal lesions or stones were seen.  The UO's were unremarkable.    Assessment/Plan: Gross hematuria.    CT and cystoscopy are negative.  F/U in 1year.  Prostate cancer with rising PSA s/p prostatectomy.   He is over 20 years out from surgery but his PSA is detectible.  It was stable to decreased at 0.4 with a testosterone of 437 in 2/21.   Repeat PSA in a year.    Meds ordered this encounter  Medications  . ciprofloxacin (CIPRO) tablet 500 mg     Orders Placed This Encounter  Procedures  . PSA    Standing Status:   Future    Standing Expiration Date:   07/17/2021  . POCT urinalysis dipstick     Return in about 1 year (around 01/15/2021) for For history of hematuria. .    CC: Dr. Benny Lennert.      [name] 01/16/2020 [phone]

## 2020-02-13 ENCOUNTER — Other Ambulatory Visit: Payer: Self-pay

## 2020-02-13 ENCOUNTER — Encounter: Payer: Self-pay | Admitting: Nurse Practitioner

## 2020-02-13 ENCOUNTER — Ambulatory Visit (INDEPENDENT_AMBULATORY_CARE_PROVIDER_SITE_OTHER): Payer: Medicare HMO | Admitting: Nurse Practitioner

## 2020-02-13 VITALS — BP 144/80 | HR 55 | Temp 97.3°F | Ht 67.0 in | Wt 182.0 lb

## 2020-02-13 DIAGNOSIS — I10 Essential (primary) hypertension: Secondary | ICD-10-CM

## 2020-02-13 DIAGNOSIS — R519 Headache, unspecified: Secondary | ICD-10-CM

## 2020-02-13 DIAGNOSIS — C61 Malignant neoplasm of prostate: Secondary | ICD-10-CM | POA: Diagnosis not present

## 2020-02-13 DIAGNOSIS — G8929 Other chronic pain: Secondary | ICD-10-CM

## 2020-02-13 DIAGNOSIS — M159 Polyosteoarthritis, unspecified: Secondary | ICD-10-CM

## 2020-02-13 DIAGNOSIS — R1310 Dysphagia, unspecified: Secondary | ICD-10-CM

## 2020-02-13 DIAGNOSIS — G4700 Insomnia, unspecified: Secondary | ICD-10-CM | POA: Diagnosis not present

## 2020-02-13 DIAGNOSIS — J302 Other seasonal allergic rhinitis: Secondary | ICD-10-CM | POA: Diagnosis not present

## 2020-02-13 DIAGNOSIS — E785 Hyperlipidemia, unspecified: Secondary | ICD-10-CM

## 2020-02-13 NOTE — Patient Instructions (Addendum)
To stop using aleve routine  Can use tylenol 325 mg 1-2 tablets every 6 hours as needed pain  To use melatonin at bedtime to help with sleep, would recommend starting with 1 mg at bedtime and can increase up to 6 mg if needed  Referral for swallow evaluation placed  Start flonase 1 spray twice daily  To use zyrtec or Claritin 10 mg daily (can look for an elixir or dissolvable tablet) (can get generic)    To schedule AWV via telephone visit Follow up in 2 months with fasting labs prior to appt

## 2020-02-13 NOTE — Progress Notes (Signed)
Careteam: Patient Care Team: Lauree Chandler, NP as PCP - General (Geriatric Medicine) [name], MD as Consulting Physician (Cardiology) [name], MD as Attending Physician (Urology)  PLACE OF SERVICE:  [place] Directive information Does Patient Have a Medical Advance Directive?: Yes, Type of Advance Directive: Living will;Healthcare Power of [place];Out of facility DNR (pink MOST or yellow form), Does patient want to make changes to medical advance directive?: No - Patient declined  Allergies  Allergen Reactions  . Other     Seasonal Allergies to Pollen.  Reaction: Sneezing, and Runny Nose.     Chief Complaint  Patient presents with  . Establish Care    New Patient Establish Care   . Advanced Directive    Patient has [place], [place] and DNR (reactivate)      HPI: Patient is a 84 y.o. male to establish care.  Born and raised in [place]. Previous doctor retire.   GERD- using tums as needed, not frequently. Will use maybe weekly- based on what he eats- his wife cooks.   Will get yeast around penis after prostatectomy and uses yeast supplement which is a gel and works well.   Leg/muscle cramp- using magnesium 500 mg daily, has improved on medication  Using aleve for headache and sleep. Reports daily headaches that are more frequent. Has had them for 3 years. Has not been evaluation by specialist. Quit work because it was making headaches bad but they continue to get work.  Starts on left side behind eye, not light or sound sensitive. Has dizziness. Has to change positions slowly   OA knee and hip- broke leg in accident- will have to wrap knee occasionally to help him sleep. Does bother him at night.   Reports he has trouble swallowing medication. They get caught in his throat and feels like it gets stuck. Also has difficulty with liquids. Will cough when 'trying to get them unhung"  Thought he was going to be sent for a swallow evaluation prior to  old PCP retiring   Seasonal allergies- runny nose, itchy eyes. Pollen allergies are worse.    Review of Systems:  Review of Systems  Constitutional: Negative for chills, fever and weight loss.  HENT: Positive for tinnitus.   Respiratory: Negative for cough, sputum production and shortness of breath.   Cardiovascular: Negative for chest pain, palpitations and leg swelling.  Gastrointestinal: Negative for abdominal pain, constipation, diarrhea and heartburn.  Genitourinary: Negative for dysuria, frequency and urgency.  Musculoskeletal: Negative for back pain, falls, joint pain and myalgias.  Skin: Negative.   Neurological: Positive for dizziness and headaches.  Psychiatric/Behavioral: Positive for memory loss. Negative for depression. The patient is nervous/anxious. The patient does not have insomnia.     Past Medical History:  Diagnosis Date  . Anxiety disorder, unspecified   . Arthritis   . Cancer [facility])    prostate  . Cataract   . Edema   . Erectile dysfunction   . Glaucoma   . History of blood in urine   . History of cataract surgery 09/26/2015   Both Eyes by Dr.Hunt.  . History of colonoscopy 09/26/2007   Dr.LeBaur  . Indigestion   . Knee pain   . Pain in unspecified hip   . Prostate cancer ([place])   . Urinary incontinence    Past Surgical History:  Procedure Laterality Date  . CATARACT EXTRACTION W/PHACO Right 01/20/2016   Procedure: CATARACT EXTRACTION PHACO AND INTRAOCULAR LENS PLACEMENT (IOC);  Surgeon: Tonny Branch, MD;  Location: [facility];  Service: Ophthalmology;  Laterality: Right;  CDE 15.42  . CATARACT EXTRACTION W/PHACO Left 03/06/2016   Procedure: CATARACT EXTRACTION PHACO AND INTRAOCULAR LENS PLACEMENT LEFT EYE;  CDE:  15.81;  Surgeon: Tonny Branch, MD;  Location: [facility];  Service: Ophthalmology;  Laterality: Left;  . CHOLECYSTECTOMY  2011  . COLONOSCOPY  07/03/2008  . FRACTURE SURGERY  09/25/1966   [facility].   . GALLBLADDER SURGERY  09/26/2007    Dr.Jenkins   . HERNIA REPAIR Right 09/26/1991   Dr.Smith   . PROSTATE SURGERY     cancer  . SUPRAPUBIC PROSTATECTOMY  09/26/1995   Dr.Wrenn  . VASECTOMY  09/25/1972   [facility].    Social History:   reports that he quit smoking about 16 years ago. His smoking use included cigarettes and cigars. He has a 7.50 pack-year smoking history. He has never used smokeless tobacco. He reports current alcohol use of about 1.0 standard drinks of alcohol per week. He reports that he does not use drugs.  Family History  Problem Relation Age of Onset  . Healthy Mother   . Hypertension Mother   . Dementia Mother   . Heart failure Mother   . Heart disease Father   . Dementia Father   . Heart disease Brother   . Stroke Brother   . Hypertension Son     Medications: Patient's Medications  New Prescriptions   No medications on file  Previous Medications   CALCIUM CARBONATE (TUMS EX) 750 MG CHEWABLE TABLET    Chew 1 tablet by mouth as needed for heartburn.   HOMEOPATHIC PRODUCTS (YEAST-GARD HOMEOPATHIC) GEL    1 application daily. Apply to genitals   MAGNESIUM 500 MG CAPS    Take 1 capsule by mouth daily.   MULTIPLE VITAMINS-MINERALS (CENTRUM SILVER ADULT 50+ PO)    Take 1 tablet by mouth daily.   NAPROXEN SODIUM (ALEVE) 220 MG TABLET    Take 220 mg by mouth daily as needed (for joint pain).  Modified Medications   No medications on file  Discontinued Medications   No medications on file    Physical Exam:  Vitals:   02/13/20 1305  BP: (!) 144/80  Pulse: (!) 55  Temp: (!) 97.3 F (36.3 C)  TempSrc: Temporal  SpO2: 98%  Weight: 182 lb (82.6 kg)  Height: 5' 7" (1.702 m)   Body mass index is 28.51 kg/m. Wt Readings from Last 3 Encounters:  02/13/20 182 lb (82.6 kg)  01/16/20 185 lb (83.9 kg)  11/14/19 180 lb (81.6 kg)    Physical Exam Constitutional:      General: He is not in acute distress.    Appearance: He is well-developed. He is not diaphoretic.  HENT:      Head: Normocephalic and atraumatic.     Mouth/Throat:     Pharynx: No oropharyngeal exudate.  Eyes:     Conjunctiva/sclera: Conjunctivae normal.     Pupils: Pupils are equal, round, and reactive to light.  Cardiovascular:     Rate and Rhythm: Normal rate and regular rhythm.     Heart sounds: Normal heart sounds.  Pulmonary:     Effort: Pulmonary effort is normal.     Breath sounds: Normal breath sounds.  Abdominal:     General: Bowel sounds are normal.     Palpations: Abdomen is soft.  Musculoskeletal:        General: No tenderness.     Cervical  back: Normal range of motion and neck supple.  Skin:    General: Skin is warm and dry.  Neurological:     Mental Status: He is alert and oriented to person, place, and time.  Psychiatric:        Mood and Affect: Mood normal.        Behavior: Behavior normal.     Labs reviewed: Basic Metabolic Panel: Recent Labs    09/04/19 1206 11/14/19 0000  NA 141 139  K 4.5 4.2  CL 103 103  CO2 28 27  GLUCOSE 88 79  BUN 19 15  CREATININE 1.20* 1.13*  CALCIUM 9.4 9.0   Liver Function Tests: Recent Labs    09/04/19 1206  AST 21  ALT 19  BILITOT 1.0  PROT 6.6   No results for input(s): LIPASE, AMYLASE in the last 8760 hours. No results for input(s): AMMONIA in the last 8760 hours. CBC: Recent Labs    09/04/19 1206  WBC 6.3  NEUTROABS 3,749  HGB 16.2  HCT 46.8  MCV 87.2  PLT 186   Lipid Panel: No results for input(s): CHOL, HDL, LDLCALC, TRIG, CHOLHDL, LDLDIRECT in the last 8760 hours. TSH: No results for input(s): TSH in the last 8760 hours. A1C: No results found for: HGBA1C   Assessment/Plan 1. Dysphagia, unspecified type -ongoing swallowing difficulties, will have him evaluated by speech with barium swallow study first, may also need GI referral. - SLP modified barium swallow; Future  2. Osteoarthritis of multiple joints, unspecified osteoarthritis type To STOP aleve, due to risk of adverse long term effects.  Can use tylenol 325 mg 1-2 tablets every 6 hours as needed  3. Seasonal allergies -uncontrolled, my be contributing to headaches. to start flonase 1 spray twice daily to bilateral nares, to use zyrtec or Claritin 10 mg daily.   4. Insomnia, unspecified type -using aleve to help sleep. Encouraged tylenol prior to bed to help with any pain. Also can use melatonin up to 6 mg qhs  5. Chronic nonintractable headache, unspecified headache type -ongoing, would like to hold off on specialist referral at this time. Question if allergies contributing. Will re-evaluate at follow up  6. Prostate cancer [facility]) -s/p radical prostatectomy in 1997, following with urologist, who follows PSA, recent 0.4.  7. Hyperlipidemia, unspecified hyperlipidemia type -no recent labs, will follow up - Lipid Panel; Future - CMP with eGFR([facility]); Future  8. Essential hypertension -elevated today, will have pt monitor, he is not on bp medication but looking back elevated bp at other visits. ?white coat syndrome. Low sodium diet recommended.  - CBC with Differential/Platelet; Future - CMP with eGFR([facility]); Future   Next appt: due for AWV and will have pt come in for follow up in 37month with fasting labs prior   K. EDavy ALakeside CityAdult Medicine [phone]

## 2020-02-25 ENCOUNTER — Encounter: Payer: Self-pay | Admitting: Nurse Practitioner

## 2020-02-25 ENCOUNTER — Telehealth: Payer: Self-pay

## 2020-02-25 ENCOUNTER — Other Ambulatory Visit: Payer: Self-pay

## 2020-02-25 ENCOUNTER — Ambulatory Visit (INDEPENDENT_AMBULATORY_CARE_PROVIDER_SITE_OTHER): Payer: Medicare HMO | Admitting: Nurse Practitioner

## 2020-02-25 DIAGNOSIS — Z0000 Encounter for general adult medical examination without abnormal findings: Secondary | ICD-10-CM

## 2020-02-25 NOTE — Patient Instructions (Signed)
Kevin [name] , Thank you for taking time to come for your Medicare Wellness Visit. I appreciate your ongoing commitment to your health goals. Please review the following plan we discussed and let me know if I can assist you in the future.   Screening recommendations/referrals: Colonoscopy up to date Recommended yearly ophthalmology/optometry visit for glaucoma screening and checkup Recommended yearly dental visit for hygiene and checkup  Vaccinations: Influenza vaccine up to date- due in September 2021 Pneumococcal vaccine due for pneumococcal 13 vaccine (can do in office) Tdap vaccine RECOMMENDED to get at local pharmacy  Shingles vaccine RECOMMENDED, to get shingrix at local pharmacy    Advanced directives: please bring to office so we can place on file, recommend completing MOST form when in office.  Conditions/risks identified: fall, advanced age, swallowing difficulties at risk for weight loss  Next appointment: 1 year  Preventive Care 38 Years and Older, Male Preventive care refers to lifestyle choices and visits with your health care provider that can promote health and wellness. What does preventive care include?  A yearly physical exam. This is also called an annual well check.  Dental exams once or twice a year.  Routine eye exams. Ask your health care provider how often you should have your eyes checked.  Personal lifestyle choices, including:  Daily care of your teeth and gums.  Regular physical activity.  Eating a healthy diet.  Avoiding tobacco and drug use.  Limiting alcohol use.  Practicing safe sex.  Taking low doses of aspirin every day.  Taking vitamin and mineral supplements as recommended by your health care provider. What happens during an annual well check? The services and screenings done by your health care provider during your annual well check will depend on your age, overall health, lifestyle risk factors, and family history of  disease. Counseling  Your health care provider may ask you questions about your:  Alcohol use.  Tobacco use.  Drug use.  Emotional well-being.  Home and relationship well-being.  Sexual activity.  Eating habits.  History of falls.  Memory and ability to understand (cognition).  Work and work Statistician. Screening  You may have the following tests or measurements:  Height, weight, and BMI.  Blood pressure.  Lipid and cholesterol levels. These may be checked every 5 years, or more frequently if you are over 24 years old.  Skin check.  Lung cancer screening. You may have this screening every year starting at age 3 if you have a 30-pack-year history of smoking and currently smoke or have quit within the past 15 years.  Fecal occult blood test (FOBT) of the stool. You may have this test every year starting at age 75.  Flexible sigmoidoscopy or colonoscopy. You may have a sigmoidoscopy every 5 years or a colonoscopy every 10 years starting at age 64.  Prostate cancer screening. Recommendations will vary depending on your family history and other risks.  Hepatitis C blood test.  Hepatitis B blood test.  Sexually transmitted disease (STD) testing.  Diabetes screening. This is done by checking your blood sugar (glucose) after you have not eaten for a while (fasting). You may have this done every 1-3 years.  Abdominal aortic aneurysm (AAA) screening. You may need this if you are a current or former smoker.  Osteoporosis. You may be screened starting at age 84 if you are at high risk. Talk with your health care provider about your test results, treatment options, and if necessary, the need for more tests. Vaccines  Your health care provider may recommend certain vaccines, such as:  Influenza vaccine. This is recommended every year.  Tetanus, diphtheria, and acellular pertussis (Tdap, Td) vaccine. You may need a Td booster every 10 years.  Zoster vaccine. You may  need this after age 54.  Pneumococcal 13-valent conjugate (PCV13) vaccine. One dose is recommended after age 45.  Pneumococcal polysaccharide (PPSV23) vaccine. One dose is recommended after age 101. Talk to your health care provider about which screenings and vaccines you need and how often you need them. This information is not intended to replace advice given to you by your health care provider. Make sure you discuss any questions you have with your health care provider. Document Released: 10/08/2015 Document Revised: 05/31/2016 Document Reviewed: 07/13/2015 Elsevier Interactive Patient Education  2017 [place] Prevention in the Home Falls can cause injuries. They can happen to people of all ages. There are many things you can do to make your home safe and to help prevent falls. What can I do on the outside of my home?  Regularly fix the edges of walkways and driveways and fix any cracks.  Remove anything that might make you trip as you walk through a door, such as a raised step or threshold.  Trim any bushes or trees on the path to your home.  Use bright outdoor lighting.  Clear any walking paths of anything that might make someone trip, such as rocks or tools.  Regularly check to see if handrails are loose or broken. Make sure that both sides of any steps have handrails.  Any raised decks and porches should have guardrails on the edges.  Have any leaves, snow, or ice cleared regularly.  Use sand or salt on walking paths during winter.  Clean up any spills in your garage right away. This includes oil or grease spills. What can I do in the bathroom?  Use night lights.  Install grab bars by the toilet and in the tub and shower. Do not use towel bars as grab bars.  Use non-skid mats or decals in the tub or shower.  If you need to sit down in the shower, use a plastic, non-slip stool.  Keep the floor dry. Clean up any water that spills on the floor as soon as it  happens.  Remove soap buildup in the tub or shower regularly.  Attach bath mats securely with double-sided non-slip rug tape.  Do not have throw rugs and other things on the floor that can make you trip. What can I do in the bedroom?  Use night lights.  Make sure that you have a light by your bed that is easy to reach.  Do not use any sheets or blankets that are too big for your bed. They should not hang down onto the floor.  Have a firm chair that has side arms. You can use this for support while you get dressed.  Do not have throw rugs and other things on the floor that can make you trip. What can I do in the kitchen?  Clean up any spills right away.  Avoid walking on wet floors.  Keep items that you use a lot in easy-to-reach places.  If you need to reach something above you, use a strong step stool that has a grab bar.  Keep electrical cords out of the way.  Do not use floor polish or wax that makes floors slippery. If you must use wax, use non-skid floor wax.  Do  not have throw rugs and other things on the floor that can make you trip. What can I do with my stairs?  Do not leave any items on the stairs.  Make sure that there are handrails on both sides of the stairs and use them. Fix handrails that are broken or loose. Make sure that handrails are as long as the stairways.  Check any carpeting to make sure that it is firmly attached to the stairs. Fix any carpet that is loose or worn.  Avoid having throw rugs at the top or bottom of the stairs. If you do have throw rugs, attach them to the floor with carpet tape.  Make sure that you have a light switch at the top of the stairs and the bottom of the stairs. If you do not have them, ask someone to add them for you. What else can I do to help prevent falls?  Wear shoes that:  Do not have high heels.  Have rubber bottoms.  Are comfortable and fit you well.  Are closed at the toe. Do not wear sandals.  If you  use a stepladder:  Make sure that it is fully opened. Do not climb a closed stepladder.  Make sure that both sides of the stepladder are locked into place.  Ask someone to hold it for you, if possible.  Clearly mark and make sure that you can see:  Any grab bars or handrails.  First and last steps.  Where the edge of each step is.  Use tools that help you move around (mobility aids) if they are needed. These include:  Canes.  Walkers.  Scooters.  Crutches.  Turn on the lights when you go into a dark area. Replace any light bulbs as soon as they burn out.  Set up your furniture so you have a clear path. Avoid moving your furniture around.  If any of your floors are uneven, fix them.  If there are any pets around you, be aware of where they are.  Review your medicines with your doctor. Some medicines can make you feel dizzy. This can increase your chance of falling. Ask your doctor what other things that you can do to help prevent falls. This information is not intended to replace advice given to you by your health care provider. Make sure you discuss any questions you have with your health care provider. Document Released: 07/08/2009 Document Revised: 02/17/2016 Document Reviewed: 10/16/2014 Elsevier Interactive Patient Education  2017 [employer].

## 2020-02-25 NOTE — Progress Notes (Signed)
This service is provided via telemedicine  No vital signs collected/recorded due to the encounter was a telemedicine visit.   Location of patient (ex: home, work):  Home  Patient consents to a telephone visit:  Yes, see telephone encounter dated 02/25/2020  Location of the provider (ex: office, home):  [facility]  Name of any referring provider:  N/A  Names of all persons participating in the telemedicine service and their role in the encounter:  Sherrie Mustache, Nurse Practitioner, Carroll Kinds, CMA, and patient.   Time spent on call:  6 minutes with medical assistant

## 2020-02-25 NOTE — Telephone Encounter (Signed)
Mr. Kevin [name], dew are scheduled for a virtual visit with your provider today.    Just as we do with appointments in the office, we must obtain your consent to participate.  Your consent will be active for this visit and any virtual visit you may have with one of our providers in the next 365 days.    If you have a MyChart account, I can also send a copy of this consent to you electronically.  All virtual visits are billed to your insurance company just like a traditional visit in the office.  As this is a virtual visit, video technology does not allow for your provider to perform a traditional examination.  This may limit your provider's ability to fully assess your condition.  If your provider identifies any concerns that need to be evaluated in person or the need to arrange testing such as labs, EKG, etc, we will make arrangements to do so.    Although advances in technology are sophisticated, we cannot ensure that it will always work on either your end or our end.  If the connection with a video visit is poor, we may have to switch to a telephone visit.  With either a video or telephone visit, we are not always able to ensure that we have a secure connection.   I need to obtain your verbal consent now.   Are you willing to proceed with your visit today?   [name] has provided verbal consent on 02/25/2020 for a virtual visit (video or telephone).   Carroll Kinds, CMA 02/25/2020  10:12 AM

## 2020-02-25 NOTE — Progress Notes (Signed)
Subjective:   Kevin [name] is a 84 y.o. male who presents for Medicare Annual/Subsequent preventive examination.  Review of Systems:   Cardiac Risk Factors include: advanced age (>94men, >36 women);male gender     Objective:    Vitals: There were no vitals taken for this visit.  There is no height or weight on file to calculate BMI.  Advanced Directives 02/25/2020 02/13/2020 03/06/2016 01/20/2016 01/18/2016  Does Patient Have a Medical Advance Directive? Yes Yes No Yes Yes  Type of Advance Directive Living will Living will;Healthcare Power of Attorney;Out of facility DNR (pink MOST or yellow form) - Special educational needs teacher of [place];Living will  Does patient want to make changes to medical advance directive? No - Patient declined No - Patient declined - No - Patient declined No - Patient declined  Copy of [place] in Chart? No - copy requested No - copy requested - No - copy requested No - copy requested  Would patient like information on creating a medical advance directive? - - No - patient declined information - -    Tobacco Social History   Tobacco Use  Smoking Status Former Smoker  . Packs/day: 0.50  . Years: 16.00  . Pack years: 8.00  . Types: Cigarettes, Cigars  . Quit date: 01/18/2004  . Years since quitting: 16.1  Smokeless Tobacco Never Used     Counseling given: Not Answered   Clinical Intake:  Pre-visit preparation completed: Yes  Pain : No/denies pain     BMI - recorded: 28 Nutritional Status: BMI 25 -29 Overweight Nutritional Risks: None Diabetes: No  How often do you need to have someone help you when you read instructions, pamphlets, or other written materials from your doctor or pharmacy?: 4 - Often What is the last grade level you completed in school?: GED        Past Medical History:  Diagnosis Date  . Anxiety disorder, unspecified   . Arthritis   . Cancer [facility])    prostate  . Cataract     . Edema   . Erectile dysfunction   . Glaucoma   . History of blood in urine   . History of cataract surgery 09/26/2015   Both Eyes by Dr.Hunt.  . History of colonoscopy 09/26/2007   Dr.LeBaur  . Indigestion   . Knee pain   . Pain in unspecified hip   . Prostate cancer ([place])   . Urinary incontinence    Past Surgical History:  Procedure Laterality Date  . CATARACT EXTRACTION W/PHACO Right 01/20/2016   Procedure: CATARACT EXTRACTION PHACO AND INTRAOCULAR LENS PLACEMENT (IOC);  Surgeon: Tonny Branch, MD;  Location: [facility];  Service: Ophthalmology;  Laterality: Right;  CDE 15.42  . CATARACT EXTRACTION W/PHACO Left 03/06/2016   Procedure: CATARACT EXTRACTION PHACO AND INTRAOCULAR LENS PLACEMENT LEFT EYE;  CDE:  15.81;  Surgeon: Tonny Branch, MD;  Location: [facility];  Service: Ophthalmology;  Laterality: Left;  . CHOLECYSTECTOMY  2011  . COLONOSCOPY  07/03/2008  . FRACTURE SURGERY  09/25/1966   [facility].   . GALLBLADDER SURGERY  09/26/2007   Dr.Jenkins   . HERNIA REPAIR Right 09/26/1991   Dr.Smith   . PROSTATE SURGERY     cancer  . SUPRAPUBIC PROSTATECTOMY  09/26/1995   Dr.Wrenn  . VASECTOMY  09/25/1972   [facility].    Family History  Problem Relation Age of Onset  . Healthy Mother   . Hypertension Mother   .  Dementia Mother   . Heart disease Father   . Dementia Father   . Heart failure Father   . Heart disease Brother   . Stroke Brother   . Hypertension Son   . Stroke Brother    Social History   Socioeconomic History  . Marital status: Married    Spouse name: Not on file  . Number of children: 3  . Years of education: Not on file  . Highest education level: Not on file  Occupational History  . Occupation: retired  Tobacco Use  . Smoking status: Former Smoker    Packs/day: 0.50    Years: 16.00    Pack years: 8.00    Types: Cigarettes, Cigars    Quit date: 01/18/2004    Years since quitting: 16.1  . Smokeless tobacco: Never Used  Substance and  Sexual Activity  . Alcohol use: Yes    Alcohol/week: 1.0 standard drinks    Types: 1 Cans of beer per week    Comment: 1 beer week  . Drug use: No  . Sexual activity: Not Currently    Birth control/protection: None  Other Topics Concern  . Not on file  Social History Narrative   Tobacco use, amount per day now: Non smoker for 16 years.   Past tobacco use, amount per day: 1/2 Pack of cigarettes a day or 3 cigars.    How many years did you use tobacco: 15 years.   Alcohol use (drinks per week): 0-2 drinks.   Diet: Normal.    Do you drink/eat things with caffeine: Yes.   Marital status:  Married                                What year were you married? 1981   Do you live in a house, apartment, assisted living, condo, trailer, etc.? House.   Is it one or more stories? 2 stories.    How many persons live in your home? 2   Do you have pets in your home?( please list)  Yes 2 dogs, 1 cat.   Highest Level of education completed: High School GED.   Current or past profession: Hydrologist.    Do you exercise? Not regularly.                                    Type and how often? Working outside on yard.    Do you have a living will? Yes.   Do you have a DNR form?  Yes.                                 If not, do you want to discuss one?   Do you have signed POA/HPOA forms? Yes.                       If so, please bring to you appointment    Do you have any difficulty bathing or dressing yourself? No.    Do you have difficulty preparing food or eating? No.   Do you have difficulty managing your medications? No.   Do you have any difficulty managing your finances? No.    Do you have any difficulty affording your medications? No.  Social Determinants of Health   Financial Resource Strain:   . Difficulty of Paying Living Expenses:   Food Insecurity:   . Worried About Charity fundraiser in the Last Year:   . Arboriculturist in the Last Year:   Transportation  Needs:   . Film/video editor (Medical):   Mar[place] Kitchen Lack of Transportation (Non-Medical):   Physical Activity:   . Days of Exercise per Week:   . Minutes of Exercise per Session:   Stress:   . Feeling of Stress :   Social Connections:   . Frequency of Communication with Friends and Family:   . Frequency of Social Gatherings with Friends and Family:   . Attends Religious Services:   . Active Member of Clubs or Organizations:   . Attends Archivist Meetings:   Mar[place] Kitchen Marital Status:     Outpatient Encounter Medications as of 02/25/2020  Medication Sig  . acetaminophen (TYLENOL) 325 MG tablet Take 650 mg by mouth every 6 (six) hours as needed.  . calcium carbonate (TUMS EX) 750 MG chewable tablet Chew 1 tablet by mouth as needed for heartburn.  . Homeopathic Products (YEAST-GARD HOMEOPATHIC) GEL 1 application daily. Apply to genitals  . Magnesium 500 MG CAPS Take 1 capsule by mouth daily.  . melatonin 3 MG TABS tablet Take 3 mg by mouth at bedtime.  . Multiple Vitamins-Minerals (CENTRUM SILVER ADULT 50+ PO) Take 1 tablet by mouth daily.  . [DISCONTINUED] naproxen sodium (ALEVE) 220 MG tablet Take 220 mg by mouth daily as needed (for joint pain).   No facility-administered encounter medications on file as of 02/25/2020.    Activities of Daily Living In your present state of health, do you have any difficulty performing the following activities: 02/25/2020  Hearing? Y  Comment occasionally  Vision? N  Difficulty concentrating or making decisions? Y  Comment remembering  Walking or climbing stairs? Y  Comment uses cane  Dressing or bathing? N  Doing errands, shopping? N  Preparing Food and eating ? Y  Comment trouble swallowing  Using the Toilet? N  In the past six months, have you accidently leaked urine? Y  Managing your Medications? N  Managing your Finances? N  Housekeeping or managing your Housekeeping? N  Some recent data might be hidden    Patient Care Team: Lauree Chandler, NP as PCP - General (Geriatric Medicine) [name], MD as Consulting Physician (Cardiology) [name], MD as Attending Physician (Urology)   Assessment:   This is a routine wellness examination for Jorje.  Exercise Activities and Dietary recommendations Current Exercise Habits: The patient does not participate in regular exercise at present, Exercise limited by: orthopedic condition(s)  Goals    . Weight (lb) < 170 lb (77.1 kg)     By limiting sweets       Fall Risk Fall Risk  02/25/2020 02/13/2020  Falls in the past year? 0 0  Number falls in past yr: 0 0  Injury with Fall? 0 0   Is the patient's home free of loose throw rugs in walkways, pet beds, electrical cords, etc?   no      Grab bars in the bathroom? yes      Handrails on the stairs?   yes      Adequate lighting?   yes  Timed Get Up and Go Performed: na  Depression Screen PHQ 2/9 Scores 02/25/2020 02/13/2020  PHQ - 2 Score 0 0  Cognitive Function     6CIT Screen 02/25/2020  What Year? 0 points  What month? 0 points  What time? 0 points  Count back from 20 0 points  Months in reverse 0 points  Repeat phrase 0 points  Total Score 0    Immunization History  Administered Date(s) Administered  . Influenza Split 07/06/2016, 07/03/2017, 05/27/2019  . Influenza-Unspecified 09/25/2018  . Moderna SARS-COVID-2 Vaccination 10/31/2019, 11/30/2019  . Pneumococcal Polysaccharide-23 01/02/2017    Qualifies for Shingles Vaccine? Yes, recommended   Screening Tests Health Maintenance  Topic Date Due  . TETANUS/TDAP  Never done  . PNA vac Low Risk Adult (2 of 2 - PCV13) 01/02/2018  . INFLUENZA VACCINE  04/25/2020  . COVID-19 Vaccine  Completed   Cancer Screenings: Lung: Low Dose CT Chest recommended if Age 7-80 years, 30 pack-year currently smoking OR have quit w/in 15years. Patient does not qualify. Colorectal: up to date  Additional Screenings: na Hepatitis C Screening:      Plan:     I  have personally reviewed and noted the following in the patient's chart:   . Medical and social history . Use of alcohol, tobacco or illicit drugs  . Current medications and supplements . Functional ability and status . Nutritional status . Physical activity . Advanced directives . List of other physicians . Hospitalizations, surgeries, and ER visits in previous 12 months . Vitals . Screenings to include cognitive, depression, and falls . Referrals and appointments  In addition, I have reviewed and discussed with patient certain preventive protocols, quality metrics, and best practice recommendations. A written personalized care plan for preventive services as well as general preventive health recommendations were provided to patient.     Lauree Chandler, NP  02/25/2020

## 2020-02-27 ENCOUNTER — Other Ambulatory Visit (HOSPITAL_COMMUNITY): Payer: Self-pay

## 2020-02-27 DIAGNOSIS — R1310 Dysphagia, unspecified: Secondary | ICD-10-CM

## 2020-03-05 DIAGNOSIS — R69 Illness, unspecified: Secondary | ICD-10-CM | POA: Diagnosis not present

## 2020-03-09 ENCOUNTER — Ambulatory Visit (HOSPITAL_COMMUNITY)
Admission: RE | Admit: 2020-03-09 | Discharge: 2020-03-09 | Disposition: A | Discharge status: Home / Self Care | Payer: Medicare HMO | Source: Ambulatory Visit | Attending: Nurse Practitioner | Admitting: Nurse Practitioner

## 2020-03-09 ENCOUNTER — Other Ambulatory Visit: Payer: Self-pay

## 2020-03-09 DIAGNOSIS — Z0389 Encounter for observation for other suspected diseases and conditions ruled out: Secondary | ICD-10-CM | POA: Diagnosis not present

## 2020-03-09 DIAGNOSIS — R1310 Dysphagia, unspecified: Secondary | ICD-10-CM | POA: Insufficient documentation

## 2020-03-09 DIAGNOSIS — R05 Cough: Secondary | ICD-10-CM | POA: Diagnosis not present

## 2020-03-09 NOTE — Progress Notes (Signed)
Modified Barium Swallow Progress Note  Patient Details  Name: Kevin [name] MRN: [id number] Date of Birth: 1936/04/03  Today's Date: 03/09/2020  Modified Barium Swallow completed.  Full report located under Chart Review in the Imaging Section.  Brief recommendations include the following:  Clinical Impression  Pt has a functional oropharyngeal swallow without aspiration observed. He does have delayed oral transit and at times piecemeal swallowing, noted primarily thin liquids via cup and thin liquids with barium tablet. This appears to be compensatory in nature as opposed to inability to transit boluses. His pharyngeal phase is [facility]. Pt did report the sensation of the barium tablet being stuck in his throat when it was observed to be further down in his esophagus, even after it had cleared his esophagus. After testing was completed, pt was also observed performing additional dry swallows, saying that he needed to "let everything get to his stomach." Question if his symptoms are more esophageal in nature, but there is no observed pharyngeal dysphagia during this study.   Swallow Evaluation Recommendations       SLP Diet Recommendations: Regular solids;Thin liquid   Liquid Administration via: Cup;Straw   Medication Administration: Whole meds with liquid (can try whole with puree if more comfortable for pt)   Supervision: Patient able to self feed   Compensations: Slow rate;Small sips/bites;Follow solids with liquid   Postural Changes: Remain semi-upright after after feeds/meals (Comment);Seated upright at 90 degrees   Oral Care Recommendations: Oral care BID         Osie Bond., M.A. [place] Pager [phone] Office [phone]  03/09/2020,1:03 PM

## 2020-03-22 DIAGNOSIS — R69 Illness, unspecified: Secondary | ICD-10-CM | POA: Diagnosis not present

## 2020-04-08 ENCOUNTER — Other Ambulatory Visit: Payer: Self-pay

## 2020-04-08 ENCOUNTER — Other Ambulatory Visit: Payer: Medicare HMO

## 2020-04-08 DIAGNOSIS — I10 Essential (primary) hypertension: Secondary | ICD-10-CM

## 2020-04-08 DIAGNOSIS — E785 Hyperlipidemia, unspecified: Secondary | ICD-10-CM | POA: Diagnosis not present

## 2020-04-09 LAB — CBC WITH DIFFERENTIAL/PLATELET
Absolute Monocytes: 515 cells/uL (ref 200–950)
Basophils Absolute: 51 cells/uL (ref 0–200)
Basophils Relative: 1 %
Eosinophils Absolute: 51 cells/uL (ref 15–500)
Eosinophils Relative: 1 %
HCT: 42.5 % (ref 38.5–50.0)
Hemoglobin: 14.9 g/dL (ref 13.2–17.1)
Lymphs Abs: 1734 cells/uL (ref 850–3900)
MCH: 31.2 pg (ref 27.0–33.0)
MCHC: 35.1 g/dL (ref 32.0–36.0)
MCV: 89.1 fL (ref 80.0–100.0)
MPV: 10.1 fL (ref 7.5–12.5)
Monocytes Relative: 10.1 %
Neutro Abs: 2749 cells/uL (ref 1500–7800)
Neutrophils Relative %: 53.9 %
Platelets: 184 10*3/uL (ref 140–400)
RBC: 4.77 10*6/uL (ref 4.20–5.80)
RDW: 12 % (ref 11.0–15.0)
Total Lymphocyte: 34 %
WBC: 5.1 10*3/uL (ref 3.8–10.8)

## 2020-04-09 LAB — COMPLETE METABOLIC PANEL WITH GFR
AG Ratio: 1.8 (calc) (ref 1.0–2.5)
ALT: 16 U/L (ref 9–46)
AST: 20 U/L (ref 10–35)
Albumin: 4.2 g/dL (ref 3.6–5.1)
Alkaline phosphatase (APISO): 64 U/L (ref 35–144)
BUN/Creatinine Ratio: 10 (calc) (ref 6–22)
BUN: 14 mg/dL (ref 7–25)
CO2: 28 mmol/L (ref 20–32)
Calcium: 9.3 mg/dL (ref 8.6–10.3)
Chloride: 105 mmol/L (ref 98–110)
Creat: 1.34 mg/dL — ABNORMAL HIGH (ref 0.70–1.11)
GFR, Est African American: 57 mL/min/{1.73_m2} — ABNORMAL LOW (ref >=60)
GFR, Est Non African American: 49 mL/min/{1.73_m2} — ABNORMAL LOW (ref >=60)
Globulin: 2.3 g/dL (calc) (ref 1.9–3.7)
Glucose, Bld: 99 mg/dL (ref 65–99)
Potassium: 4.4 mmol/L (ref 3.5–5.3)
Sodium: 142 mmol/L (ref 135–146)
Total Bilirubin: 0.9 mg/dL (ref 0.2–1.2)
Total Protein: 6.5 g/dL (ref 6.1–8.1)

## 2020-04-09 LAB — LIPID PANEL
Cholesterol: 177 mg/dL (ref <200)
HDL: 51 mg/dL (ref >=40)
LDL Cholesterol (Calc): 111 mg/dL (calc) — ABNORMAL HIGH
Non-HDL Cholesterol (Calc): 126 mg/dL (calc) (ref <130)
Total CHOL/HDL Ratio: 3.5 (calc) (ref <5.0)
Triglycerides: 66 mg/dL (ref <150)

## 2020-04-14 ENCOUNTER — Ambulatory Visit (INDEPENDENT_AMBULATORY_CARE_PROVIDER_SITE_OTHER): Payer: Medicare HMO | Admitting: Nurse Practitioner

## 2020-04-14 ENCOUNTER — Other Ambulatory Visit: Payer: Self-pay

## 2020-04-14 ENCOUNTER — Encounter: Payer: Self-pay | Admitting: Nurse Practitioner

## 2020-04-14 VITALS — BP 128/74 | HR 58 | Temp 96.9°F | Ht 67.0 in | Wt 176.4 lb

## 2020-04-14 DIAGNOSIS — I10 Essential (primary) hypertension: Secondary | ICD-10-CM | POA: Diagnosis not present

## 2020-04-14 DIAGNOSIS — K5901 Slow transit constipation: Secondary | ICD-10-CM | POA: Diagnosis not present

## 2020-04-14 DIAGNOSIS — M159 Polyosteoarthritis, unspecified: Secondary | ICD-10-CM

## 2020-04-14 DIAGNOSIS — G4700 Insomnia, unspecified: Secondary | ICD-10-CM | POA: Diagnosis not present

## 2020-04-14 DIAGNOSIS — R1310 Dysphagia, unspecified: Secondary | ICD-10-CM

## 2020-04-14 DIAGNOSIS — E785 Hyperlipidemia, unspecified: Secondary | ICD-10-CM | POA: Diagnosis not present

## 2020-04-14 DIAGNOSIS — Z8546 Personal history of malignant neoplasm of prostate: Secondary | ICD-10-CM

## 2020-04-14 DIAGNOSIS — N1831 Chronic kidney disease, stage 3a: Secondary | ICD-10-CM

## 2020-04-14 NOTE — Patient Instructions (Addendum)
To get TDAP at pharmacy    Heart-Healthy Eating Plan Many factors influence your heart (coronary) health, including eating and exercise habits. Coronary risk increases with abnormal blood fat (lipid) levels. Heart-healthy meal planning includes limiting unhealthy fats, increasing healthy fats, and making other diet and lifestyle changes. What is my plan? Your health care provider may recommend that you:  Limit your fat intake to _________% or less of your total calories each day.  Limit your saturated fat intake to _________% or less of your total calories each day.  Limit the amount of cholesterol in your diet to less than _________ mg per day. What are tips for following this plan? Cooking Cook foods using methods other than frying. Baking, boiling, grilling, and broiling are all good options. Other ways to reduce fat include:  Removing the skin from poultry.  Removing all visible fats from meats.  Steaming vegetables in water or broth. Meal planning   At meals, imagine dividing your plate into fourths: ? Fill one-half of your plate with vegetables and green salads. ? Fill one-fourth of your plate with whole grains. ? Fill one-fourth of your plate with lean protein foods.  Eat 4-5 servings of vegetables per day. One serving equals 1 cup raw or cooked vegetable, or 2 cups raw leafy greens.  Eat 4-5 servings of fruit per day. One serving equals 1 medium whole fruit,  cup dried fruit,  cup fresh, frozen, or canned fruit, or  cup 100% fruit juice.  Eat more foods that contain soluble fiber. Examples include apples, broccoli, carrots, beans, peas, and barley. Aim to get 25-30 g of fiber per day.  Increase your consumption of legumes, nuts, and seeds to 4-5 servings per week. One serving of dried beans or legumes equals  cup cooked, 1 serving of nuts is  cup, and 1 serving of seeds equals 1 tablespoon. Fats  Choose healthy fats more often. Choose monounsaturated and  polyunsaturated fats, such as olive and canola oils, flaxseeds, walnuts, almonds, and seeds.  Eat more omega-3 fats. Choose salmon, mackerel, sardines, tuna, flaxseed oil, and ground flaxseeds. Aim to eat fish at least 2 times each week.  Check food labels carefully to identify foods with trans fats or high amounts of saturated fat.  Limit saturated fats. These are found in animal products, such as meats, butter, and cream. Plant sources of saturated fats include palm oil, palm kernel oil, and coconut oil.  Avoid foods with partially hydrogenated oils in them. These contain trans fats. Examples are stick margarine, some tub margarines, cookies, crackers, and other baked goods.  Avoid fried foods. General information  Eat more home-cooked food and less restaurant, buffet, and fast food.  Limit or avoid alcohol.  Limit foods that are high in starch and sugar.  Lose weight if you are overweight. Losing just 5-10% of your body weight can help your overall health and prevent diseases such as diabetes and heart disease.  Monitor your salt (sodium) intake, especially if you have high blood pressure. Talk with your health care provider about your sodium intake.  Try to incorporate more vegetarian meals weekly. What foods can I eat? Fruits All fresh, canned (in natural juice), or frozen fruits. Vegetables Fresh or frozen vegetables (raw, steamed, roasted, or grilled). Green salads. Grains Most grains. Choose whole wheat and whole grains most of the time. Rice and pasta, including brown rice and pastas made with whole wheat. Meats and other proteins Lean, well-trimmed beef, veal, pork, and lamb. Chicken and  [place] without skin. All fish and shellfish. Wild duck, rabbit, pheasant, and venison. Egg whites or low-cholesterol egg substitutes. Dried beans, peas, lentils, and tofu. Seeds and most nuts. Dairy Low-fat or nonfat cheeses, including ricotta and mozzarella. Skim or 1% milk (liquid,  powdered, or evaporated). Buttermilk made with low-fat milk. Nonfat or low-fat yogurt. Fats and oils Non-hydrogenated (trans-free) margarines. Vegetable oils, including soybean, sesame, sunflower, olive, peanut, safflower, corn, canola, and cottonseed. Salad dressings or mayonnaise made with a vegetable oil. Beverages Water (mineral or sparkling). Coffee and tea. Diet carbonated beverages. Sweets and desserts Sherbet, gelatin, and fruit ice. Small amounts of dark chocolate. Limit all sweets and desserts. Seasonings and condiments All seasonings and condiments. The items listed above may not be a complete list of foods and beverages you can eat. Contact a dietitian for more options. What foods are not recommended? Fruits Canned fruit in heavy syrup. Fruit in cream or butter sauce. Fried fruit. Limit coconut. Vegetables Vegetables cooked in cheese, cream, or butter sauce. Fried vegetables. Grains Breads made with saturated or trans fats, oils, or whole milk. Croissants. Sweet rolls. Donuts. High-fat crackers, such as cheese crackers. Meats and other proteins Fatty meats, such as hot dogs, ribs, sausage, bacon, rib-eye roast or steak. High-fat deli meats, such as salami and bologna. Caviar. Domestic duck and goose. Organ meats, such as liver. Dairy Cream, sour cream, cream cheese, and creamed cottage cheese. Whole milk cheeses. Whole or 2% milk (liquid, evaporated, or condensed). Whole buttermilk. Cream sauce or high-fat cheese sauce. Whole-milk yogurt. Fats and oils Meat fat, or shortening. Cocoa butter, hydrogenated oils, palm oil, coconut oil, palm kernel oil. Solid fats and shortenings, including bacon fat, salt pork, lard, and butter. Nondairy cream substitutes. Salad dressings with cheese or sour cream. Beverages Regular sodas and any drinks with added sugar. Sweets and desserts Frosting. Pudding. Cookies. Cakes. Pies. Milk chocolate or white chocolate. Buttered syrups. Full-fat ice  cream or ice cream drinks. The items listed above may not be a complete list of foods and beverages to avoid. Contact a dietitian for more information. Summary  Heart-healthy meal planning includes limiting unhealthy fats, increasing healthy fats, and making other diet and lifestyle changes.  Lose weight if you are overweight. Losing just 5-10% of your body weight can help your overall health and prevent diseases such as diabetes and heart disease.  Focus on eating a balance of foods, including fruits and vegetables, low-fat or nonfat dairy, lean protein, nuts and legumes, whole grains, and heart-healthy oils and fats. This information is not intended to replace advice given to you by your health care provider. Make sure you discuss any questions you have with your health care provider. Document Revised: 10/19/2017 Document Reviewed: 10/19/2017 Elsevier Patient Education  2020 [employer].

## 2020-04-14 NOTE — Progress Notes (Signed)
Careteam: Patient Care Team: Lauree Chandler, NP as PCP - General (Geriatric Medicine) [name], MD as Consulting Physician (Cardiology) [name], MD as Attending Physician (Urology)  PLACE OF SERVICE:  [place] Directive information    No Known Allergies  Chief Complaint  Patient presents with   Medical Management of Chronic Issues    2 month follow-up and discuss labs   Diet    Patient only eats chicken and his wife questions if that has anything to do with the 2 GFR's vaule    Immunizations    Discuss need for TD and PNA      HPI: Patient is a 84 y.o. male for routine follow up.   Headaches- ongoing from stress with wife.- uses tylenol. Sometimes has bad headaches but not lately.   OA- ongoing pain in back- tries to stay away from medication, uses tylenol occasional.   Dysphagia- went for a barium swallow evaluation and was normal. Pills continue to feel like they are getting hung, food goes down fine but liquid and pills are an issue. Declines further evaluation at this time. Having weight loss- reports he had a tooth pulled and continues to have problems with the area- having to go back.  Also eating better. Eats mostly chicken.   Insomnia- stable on melatonin 6 mg   Seasonal allergies- improved since pollen has gotten better. Headaches also improved since allergies has improved.   Wants to check with insurance before getting his PNA 13 vaccine  Reviewed lab with pt.   Review of Systems:  Review of Systems  Constitutional: Negative for chills, fever and weight loss.  HENT: Negative for tinnitus.   Respiratory: Negative for cough, sputum production and shortness of breath.   Cardiovascular: Negative for chest pain, palpitations and leg swelling.  Gastrointestinal: Negative for abdominal pain, constipation, diarrhea and heartburn.  Genitourinary: Negative for dysuria, frequency and urgency.  Musculoskeletal: Negative for back pain,  joint pain and myalgias.  Skin: Negative.   Neurological: Positive for headaches. Negative for dizziness.  Psychiatric/Behavioral: Negative for depression and memory loss. The patient does not have insomnia.     Past Medical History:  Diagnosis Date   Anxiety disorder, unspecified    Arthritis    Cancer ([place])    prostate   Cataract    Edema    Erectile dysfunction    Glaucoma    History of blood in urine    History of cataract surgery 09/26/2015   Both Eyes by Dr.Hunt.   History of colonoscopy 09/26/2007   Dr.LeBaur   Indigestion    Knee pain    Pain in unspecified hip    Prostate cancer [facility])    Urinary incontinence    Past Surgical History:  Procedure Laterality Date   CATARACT EXTRACTION W/PHACO Right 01/20/2016   Procedure: CATARACT EXTRACTION PHACO AND INTRAOCULAR LENS PLACEMENT (IOC);  Surgeon: Tonny Branch, MD;  Location: [facility];  Service: Ophthalmology;  Laterality: Right;  CDE 15.42   CATARACT EXTRACTION W/PHACO Left 03/06/2016   Procedure: CATARACT EXTRACTION PHACO AND INTRAOCULAR LENS PLACEMENT LEFT EYE;  CDE:  15.81;  Surgeon: Tonny Branch, MD;  Location: [facility];  Service: Ophthalmology;  Laterality: Left;   CHOLECYSTECTOMY  2011   COLONOSCOPY  07/03/2008   FRACTURE SURGERY  09/25/1966   [place] SURGERY  09/26/2007   Dr.Jenkins    HERNIA REPAIR Right 09/26/1991   Dr.Smith    PROSTATE SURGERY  cancer   SUPRAPUBIC PROSTATECTOMY  09/26/1995   Dr.Wrenn   VASECTOMY  09/25/1972   [facility].    Social History:   reports that he quit smoking about 16 years ago. His smoking use included cigarettes and cigars. He has a 8.00 pack-year smoking history. He has never used smokeless tobacco. He reports current alcohol use of about 1.0 standard drink of alcohol per week. He reports that he does not use drugs.  Family History  Problem Relation Age of Onset   Healthy Mother    Hypertension Mother     Dementia Mother    Heart disease Father    Dementia Father    Heart failure Father    Heart disease Brother    Stroke Brother    Hypertension Son    Stroke Brother     Medications: Patient's Medications  New Prescriptions   No medications on file  Previous Medications   ACETAMINOPHEN (TYLENOL) 325 MG TABLET    Take 650 mg by mouth every 6 (six) hours as needed.   CALCIUM CARBONATE (TUMS EX) 750 MG CHEWABLE TABLET    Chew 1 tablet by mouth as needed for heartburn.   HOMEOPATHIC PRODUCTS (YEAST-GARD HOMEOPATHIC) GEL    1 application daily. Apply to genitals   MAGNESIUM 500 MG CAPS    Take 1 capsule by mouth daily.   MELATONIN 3 MG TABS TABLET    Take 3 mg by mouth at bedtime.   MULTIPLE VITAMINS-MINERALS (CENTRUM SILVER ADULT 50+ PO)    Take 1 tablet by mouth daily.  Modified Medications   No medications on file  Discontinued Medications   No medications on file    Physical Exam:  Vitals:   04/14/20 1411  BP: 128/74  Pulse: (!) 58  Temp: (!) 96.9 F (36.1 C)  TempSrc: Temporal  SpO2: 97%  Weight: 176 lb 6.4 oz (80 kg)  Height: 5\' 7"  (1.702 m)   Body mass index is 27.63 kg/m. Wt Readings from Last 3 Encounters:  04/14/20 176 lb 6.4 oz (80 kg)  02/13/20 182 lb (82.6 kg)  01/16/20 185 lb (83.9 kg)    Physical Exam Constitutional:      General: He is not in acute distress.    Appearance: He is well-developed. He is not diaphoretic.  HENT:     Head: Normocephalic and atraumatic.  Eyes:     Conjunctiva/sclera: Conjunctivae normal.     Pupils: Pupils are equal, round, and reactive to light.  Cardiovascular:     Rate and Rhythm: Normal rate and regular rhythm.     Heart sounds: Normal heart sounds.  Pulmonary:     Effort: Pulmonary effort is normal.     Breath sounds: Normal breath sounds.  Abdominal:     General: Bowel sounds are normal.     Palpations: Abdomen is soft.  Musculoskeletal:        General: No tenderness.     Cervical back: Normal range  of motion and neck supple.  Skin:    General: Skin is warm and dry.  Neurological:     Mental Status: He is alert and oriented to person, place, and time.  Psychiatric:        Mood and Affect: Mood normal.        Behavior: Behavior normal.     Labs reviewed: Basic Metabolic Panel: Recent Labs    09/04/19 1206 11/14/19 0000 04/08/20 0856  NA 141 139 142  K 4.5 4.2 4.4  CL 103  103 105  CO2 28 27 28   GLUCOSE 88 79 99  BUN 19 15 14   CREATININE 1.20* 1.13* 1.34*  CALCIUM 9.4 9.0 9.3   Liver Function Tests: Recent Labs    09/04/19 1206 04/08/20 0856  AST 21 20  ALT 19 16  BILITOT 1.0 0.9  PROT 6.6 6.5   No results for input(s): LIPASE, AMYLASE in the last 8760 hours. No results for input(s): AMMONIA in the last 8760 hours. CBC: Recent Labs    09/04/19 1206 04/08/20 0856  WBC 6.3 5.1  NEUTROABS 3,749 2,749  HGB 16.2 14.9  HCT 46.8 42.5  MCV 87.2 89.1  PLT 186 184   Lipid Panel: Recent Labs    04/08/20 0856  CHOL 177  HDL 51  LDLCALC 111*  TRIG 66  CHOLHDL 3.5   TSH: No results for input(s): TSH in the last 8760 hours. A1C: No results found for: HGBA1C   Assessment/Plan 1. Slow transit constipation Controlled on mag supplement  2. Dysphagia, unspecified type Ongoing, MBS completed without abnormal findings, discuss GI referral and need for work up with pt today but he has decline at this time.   3. Osteoarthritis of multiple joints, unspecified osteoarthritis type -stable, has tylenol to use PRN if needed, rarely uses  4. Insomnia, unspecified type -stable on melatonin. Has stopped Aleve.   5. Hyperlipidemia, unspecified hyperlipidemia type Diet controled, continue heart healthy eating plan.   6. Essential hypertension Stable at this time, continue on dietary modifications.   7. History of prostate cancer Continues to follow up with urology.   8. CKD stage 3 -Encourage proper hydration and to avoid NSAIDS (Aleve, Advil, Motrin,  Ibuprofen)   Next appt: 6 months, labs with appt Sinjin Amero K. [place], [facility] [phone]

## 2020-07-20 DIAGNOSIS — R69 Illness, unspecified: Secondary | ICD-10-CM | POA: Diagnosis not present

## 2020-07-22 ENCOUNTER — Ambulatory Visit: Payer: Medicare HMO | Attending: Internal Medicine

## 2020-07-22 DIAGNOSIS — Z23 Encounter for immunization: Secondary | ICD-10-CM

## 2020-07-22 NOTE — Progress Notes (Signed)
   Covid-19 Vaccination Clinic  Name:  Kevin [name]    MRN: [id number] DOB: January 01, 1936  07/22/2020  Kevin [name] was observed post Covid-19 immunization for 15 minutes without incident. He was provided with Vaccine Information Sheet and instruction to access the V-Safe system.   Kevin [name] was instructed to call 911 with any severe reactions post vaccine: Marland Kitchen Difficulty breathing  . Swelling of face and throat  . A fast heartbeat  . A bad rash all over body  . Dizziness and weakness

## 2020-09-09 DIAGNOSIS — R69 Illness, unspecified: Secondary | ICD-10-CM | POA: Diagnosis not present

## 2020-10-05 ENCOUNTER — Other Ambulatory Visit: Payer: Self-pay

## 2020-10-05 ENCOUNTER — Encounter: Payer: Self-pay | Admitting: Orthopedic Surgery

## 2020-10-05 ENCOUNTER — Ambulatory Visit (INDEPENDENT_AMBULATORY_CARE_PROVIDER_SITE_OTHER): Payer: Medicare HMO | Admitting: Orthopedic Surgery

## 2020-10-05 VITALS — BP 146/88 | HR 55 | Temp 97.5°F | Ht 67.0 in | Wt 173.8 lb

## 2020-10-05 DIAGNOSIS — B029 Zoster without complications: Secondary | ICD-10-CM

## 2020-10-05 MED ORDER — VALACYCLOVIR HCL 1 G PO TABS: 1000.0000 mg | ORAL_TABLET | Freq: Two times a day (BID) | ORAL | 0 refills | Status: DC

## 2020-10-05 NOTE — Progress Notes (Signed)
Location:   [facility] of Service:   [facility] Provider:  Hazle Nordmann, AGNP-C  Eubanks, Janene Harvey, NP  Patient Care Team: Sharon Seller, NP as PCP - General (Geriatric Medicine) Wendall Stade, MD as Consulting Physician (Cardiology) Bjorn Pippin, MD as Attending Physician (Urology)  Extended Emergency Contact Information Primary Emergency Contact: Scully,Pat Address: [address]          [place], [place] [address] [place] Home Phone: [phone] Work Phone: [phone] Mobile Phone: [phone] Relation: Spouse Secondary Emergency Contact: Janeece F[place] Work Phone: [phone] Mobile Phone: [phone] Relation: Son   Goals of care: Advanced Directive information Advanced Directives 02/25/2020  Does Patient Have a Medical Advance Directive? Yes  Type of Advance Directive Living will  Does patient want to make changes to medical advance directive? No - Patient declined  Copy of Healthcare Power of Attorney in Chart? No - copy requested  Would patient like information on creating a medical advance directive? -     Chief Complaint  Patient presents with  . Acute Visit    Possible Shingles. Right hand and Right leg has some tenderness and "sores". Has used BioFreeze on it and it seemed to help some.     HPI:  Pt is a 85 y.o. male seen today for an acute visit for rash.   Rash started 01/07 on anterior right hand. On 01/09 right upper leg rash began to develop. Both areas were painful prior to rash developing. Claims rash on leg more painful than hand. Has been using Biofreeze to help with pain, states it has been effective. Pain rated 4/10 at this time. No history of shingles in the past. Never got shingles vaccine because it was too expensive. Denies fever, itching, or swelling at rash sites. Denies exposure to allergen.      Past Medical History:  Diagnosis Date  . Anxiety  disorder, unspecified   . Arthritis   . Cancer [facility])    prostate  . Cataract   . Edema   . Erectile dysfunction   . Glaucoma   . History of blood in urine   . History of cataract surgery 09/26/2015   Both Eyes by Dr.Hunt.  . History of colonoscopy 09/26/2007   Dr.LeBaur  . Indigestion   . Knee pain   . Pain in unspecified hip   . Prostate cancer ([facility])   . Urinary incontinence    Past Surgical History:  Procedure Laterality Date  . CATARACT EXTRACTION W/PHACO Right 01/20/2016   Procedure: CATARACT EXTRACTION PHACO AND INTRAOCULAR LENS PLACEMENT (IOC);  Surgeon: Gemma Payor, MD;  Location: [facility];  Service: Ophthalmology;  Laterality: Right;  CDE 15.42  . CATARACT EXTRACTION W/PHACO Left 03/06/2016   Procedure: CATARACT EXTRACTION PHACO AND INTRAOCULAR LENS PLACEMENT LEFT EYE;  CDE:  15.81;  Surgeon: Gemma Payor, MD;  Location: [facility];  Service: Ophthalmology;  Laterality: Left;  . CHOLECYSTECTOMY  2011  . COLONOSCOPY  07/03/2008  . FRACTURE SURGERY  09/25/1966   [facility].   . GALLBLADDER SURGERY  09/26/2007   Dr.Jenkins   . HERNIA REPAIR Right 09/26/1991   Dr.Smith   . PROSTATE SURGERY     cancer  . SUPRAPUBIC PROSTATECTOMY  09/26/1995   Dr.Wrenn  . VASECTOMY  09/25/1972   [facility].     No Known Allergies  Outpatient Encounter Medications as of 10/05/2020  Medication Sig  . acetaminophen (TYLENOL) 325 MG  tablet Take 650 mg by mouth every 6 (six) hours as needed.  . calcium carbonate (TUMS EX) 750 MG chewable tablet Chew 1 tablet by mouth as needed for heartburn.  . Homeopathic Products (YEAST-GARD HOMEOPATHIC) GEL 1 application daily. Apply to genitals  . Magnesium 500 MG CAPS Take 1 capsule by mouth daily.  . melatonin 3 MG TABS tablet Take 3 mg by mouth at bedtime.  . Menthol, Topical Analgesic, (BIOFREEZE) 4 % GEL Apply topically daily as needed.  . [DISCONTINUED] Multiple Vitamins-Minerals (CENTRUM SILVER ADULT 50+ PO) Take 1 tablet by mouth  daily.   No facility-administered encounter medications on file as of 10/05/2020.    Review of Systems  Constitutional: Negative for activity change, appetite change and fever.  Respiratory: Negative for cough, shortness of breath and wheezing.   Cardiovascular: Negative for chest pain and leg swelling.  Skin:       Rash on hand and leg.     Immunization History  Administered Date(s) Administered  . Influenza Split 07/06/2016, 07/03/2017, 05/27/2019  . Influenza, High Dose Seasonal PF 07/20/2020  . Influenza-Unspecified 09/25/2018  . Moderna SARS-COV2 Booster Vaccination 07/22/2020  . Moderna Sars-Covid-2 Vaccination 10/31/2019, 11/30/2019  . Pneumococcal Polysaccharide-23 01/02/2017   Pertinent  Health Maintenance Due  Topic Date Due  . PNA vac Low Risk Adult (2 of 2 - PCV13) 01/02/2018  . INFLUENZA VACCINE  Completed   Fall Risk  04/14/2020 02/25/2020 02/13/2020  Falls in the past year? 0 0 0  Number falls in past yr: 0 0 0  Injury with Fall? 0 0 0   Functional Status Survey:    Vitals:   10/05/20 1448  BP: (!) 146/88  Pulse: (!) 55  Temp: (!) 97.5 F (36.4 C)  TempSrc: Skin  SpO2: 98%  Weight: 173 lb 12.8 oz (78.8 kg)  Height: 5\' 7"  (1.702 m)   Body mass index is 27.22 kg/m. Physical Exam Vitals reviewed.  Constitutional:      General: He is not in acute distress.    Appearance: Normal appearance.  Cardiovascular:     Rate and Rhythm: Normal rate and regular rhythm.     Pulses: Normal pulses.     Heart sounds: Normal heart sounds. No murmur heard.   Pulmonary:     Effort: Pulmonary effort is normal. No respiratory distress.     Breath sounds: Normal breath sounds. No wheezing.  Skin:    General: Skin is cool.     Capillary Refill: Capillary refill takes less than 2 seconds.     Findings: Rash present. Rash is papular.       Neurological:     Mental Status: He is alert.     Labs reviewed: Recent Labs    11/14/19 0000 04/08/20 0856  NA 139 142   K 4.2 4.4  CL 103 105  CO2 27 28  GLUCOSE 79 99  BUN 15 14  CREATININE 1.13* 1.34*  CALCIUM 9.0 9.3   Recent Labs    04/08/20 0856  AST 20  ALT 16  BILITOT 0.9  PROT 6.5   Recent Labs    04/08/20 0856  WBC 5.1  NEUTROABS 2,749  HGB 14.9  HCT 42.5  MCV 89.1  PLT 184   Lab Results  Component Value Date   TSH 1.92 01/09/2017   No results found for: HGBA1C Lab Results  Component Value Date   CHOL 177 04/08/2020   HDL 51 04/08/2020   LDLCALC 111 (H) 04/08/2020   TRIG  66 04/08/2020   CHOLHDL 3.5 04/08/2020    Significant Diagnostic Results in last 30 days:  No results found.  Assessment/Plan 1. Herpes zoster without complication - pain in both areas prior to rash development, rash one sided with small blister formation - valACYclovir (VALTREX) 1000 MG tablet; Take 1 tablet (1,000 mg total) by mouth 2 (two) times daily.  Dispense: 14 tablet; Refill: 0  - advised to reach out to PCP if develops around eye, or symptoms worsen - recommend shingles vaccine (Shingrix) at local pharmacy after rash resolves    Family/ staff Communication: Plan discussed with patient  Labs/tests ordered:  none

## 2020-10-05 NOTE — Patient Instructions (Signed)
Shingles  Shingles, which is also known as herpes zoster, is an infection that causes a painful skin rash and fluid-filled blisters. It is caused by a virus. Shingles only develops in people who:  Have had chickenpox.  Have been given a medicine to protect against chickenpox (have been vaccinated). Shingles is rare in this group. What are the causes? Shingles is caused by varicella-zoster virus (VZV). This is the same virus that causes chickenpox. After a person is exposed to VZV, the virus stays in the body in an inactive (dormant) state. Shingles develops if the virus is reactivated. This can happen many years after the first (initial) exposure to VZV. It is not known what causes this virus to be reactivated. What increases the risk? People who have had chickenpox or received the chickenpox vaccine are at risk for shingles. Shingles infection is more common in people who:  Are older than age 46.  Have a weakened disease-fighting system (immune system), such as people with: ? HIV. ? AIDS. ? Cancer.  Are taking medicines that weaken the immune system, such as transplant medicines.  Are experiencing a lot of stress. What are the signs or symptoms? Early symptoms of this condition include itching, tingling, and pain in an area on your skin. Pain may be described as burning, stabbing, or throbbing. A few days or weeks after early symptoms start, a painful red rash appears. The rash is usually on one side of the body and has a band-like or belt-like pattern. The rash eventually turns into fluid-filled blisters that break open, change into scabs, and dry up in about 2-3 weeks. At any time during the infection, you may also develop:  A fever.  Chills.  A headache.  An upset stomach. How is this diagnosed? This condition is diagnosed with a skin exam. Skin or fluid samples may be taken from the blisters before a diagnosis is made. These samples are examined under a microscope or sent to  a lab for testing. How is this treated? The rash may last for several weeks. There is not a specific cure for this condition. Your health care provider will probably prescribe medicines to help you manage pain, recover more quickly, and avoid long-term problems. Medicines may include:  Antiviral drugs.  Anti-inflammatory drugs.  Pain medicines.  Anti-itching medicines (antihistamines). If the area involved is on your face, you may be referred to a specialist, such as an eye doctor (ophthalmologist) or an ear, nose, and throat (ENT) doctor (otolaryngologist) to help you avoid eye problems, chronic pain, or disability. Follow these instructions at home: Medicines  Take over-the-counter and prescription medicines only as told by your health care provider.  Apply an anti-itch cream or numbing cream to the affected area as told by your health care provider. Relieving itching and discomfort  Apply cold, wet cloths (cold compresses) to the area of the rash or blisters as told by your health care provider.  Cool baths can be soothing. Try adding baking soda or dry oatmeal to the water to reduce itching. Do not bathe in hot water.   Blister and rash care  Keep your rash covered with a loose bandage (dressing). Wear loose-fitting clothing to help ease the pain of material rubbing against the rash.  Keep your rash and blisters clean by washing the area with mild soap and cool water as told by your health care provider.  Check your rash every day for signs of infection. Check for: ? More redness, swelling, or pain. ?  Fluid or blood. ? Warmth. ? Pus or a bad smell.  Do not scratch your rash or pick at your blisters. To help avoid scratching: ? Keep your fingernails clean and cut short. ? Wear gloves or mittens while you sleep, if scratching is a problem. General instructions  Rest as told by your health care provider.  Keep all follow-up visits as told by your health care provider. This  is important.  Wash your hands often with soap and water. If soap and water are not available, use hand sanitizer. Doing this lowers your chance of getting a bacterial skin infection.  Before your blisters change into scabs, your shingles infection can cause chickenpox in people who have never had it or have never been vaccinated against it. To prevent this from happening, avoid contact with other people, especially: ? Babies. ? Pregnant women. ? Children who have eczema. ? Elderly people who have transplants. ? People who have chronic illnesses, such as cancer or AIDS. Contact a health care provider if:  Your pain is not relieved with prescribed medicines.  Your pain does not get better after the rash heals.  You have signs of infection in the rash area, such as: ? More redness, swelling, or pain around the rash. ? Fluid or blood coming from the rash. ? The rash area feeling warm to the touch. ? Pus or a bad smell coming from the rash. Get help right away if:  The rash is on your face or nose.  You have facial pain, pain around your eye area, or loss of feeling on one side of your face.  You have difficulty seeing.  You have ear pain or have ringing in your ear.  You have a loss of taste.  Your condition gets worse. Summary  Shingles, which is also known as herpes zoster, is an infection that causes a painful skin rash and fluid-filled blisters.  This condition is diagnosed with a skin exam. Skin or fluid samples may be taken from the blisters and examined before the diagnosis is made.  Keep your rash covered with a loose bandage (dressing). Wear loose-fitting clothing to help ease the pain of material rubbing against the rash.  Before your blisters change into scabs, your shingles infection can cause chickenpox in people who have never had it or have never been vaccinated against it. This information is not intended to replace advice given to you by your health care  provider. Make sure you discuss any questions you have with your health care provider. Document Revised: 01/03/2019 Document Reviewed: 05/16/2017 Elsevier Patient Education  2021 [employer].

## 2020-10-20 ENCOUNTER — Other Ambulatory Visit: Payer: Self-pay

## 2020-10-20 ENCOUNTER — Encounter: Payer: Self-pay | Admitting: Nurse Practitioner

## 2020-10-20 ENCOUNTER — Ambulatory Visit (INDEPENDENT_AMBULATORY_CARE_PROVIDER_SITE_OTHER): Payer: Medicare HMO | Admitting: Nurse Practitioner

## 2020-10-20 VITALS — BP 140/80 | HR 58 | Temp 97.3°F | Ht 67.0 in | Wt 175.6 lb

## 2020-10-20 DIAGNOSIS — N1831 Chronic kidney disease, stage 3a: Secondary | ICD-10-CM

## 2020-10-20 DIAGNOSIS — R1310 Dysphagia, unspecified: Secondary | ICD-10-CM

## 2020-10-20 DIAGNOSIS — I10 Essential (primary) hypertension: Secondary | ICD-10-CM

## 2020-10-20 DIAGNOSIS — G8929 Other chronic pain: Secondary | ICD-10-CM | POA: Diagnosis not present

## 2020-10-20 DIAGNOSIS — R519 Headache, unspecified: Secondary | ICD-10-CM | POA: Diagnosis not present

## 2020-10-20 DIAGNOSIS — E785 Hyperlipidemia, unspecified: Secondary | ICD-10-CM

## 2020-10-20 DIAGNOSIS — Z8546 Personal history of malignant neoplasm of prostate: Secondary | ICD-10-CM | POA: Diagnosis not present

## 2020-10-20 DIAGNOSIS — M159 Polyosteoarthritis, unspecified: Secondary | ICD-10-CM

## 2020-10-20 DIAGNOSIS — B0229 Other postherpetic nervous system involvement: Secondary | ICD-10-CM | POA: Diagnosis not present

## 2020-10-20 DIAGNOSIS — G4700 Insomnia, unspecified: Secondary | ICD-10-CM

## 2020-10-20 NOTE — Progress Notes (Signed)
Careteam: Patient Care Team: Lauree Chandler, NP as PCP - General (Geriatric Medicine) [name], MD as Consulting Physician (Cardiology) [name], MD as Attending Physician (Urology)  PLACE OF SERVICE:  [place] Directive information Does Patient Have a Medical Advance Directive?: Yes, Type of Advance Directive: Living will, Does patient want to make changes to medical advance directive?: No - Patient declined  No Known Allergies  Chief Complaint  Patient presents with  . Medical Management of Chronic Issues    Patient is seen for 18-month followup  . Immunizations    Patient has no documentation of a tetanus shot or Prevnar-13. He declined tetanus today. Is unsure about Prevnar.      HPI: Patient is a 85 y.o. male for routine follow up.   Herpes zoster- recent episode mostly resolved, slight tingling in right side of hand.   R eye irritation- R eye slightly red with dryness. Has not tried any eye drops. Last eye exam 2017, will schedule apt soon.   Headaches- ongoing, takes tylenol for symptom relief.  OA- ongoing pain in back, no complaints recently, uses tylenol occasional.   Dysphagia- ongoing, still having difficulty swallowing certain foods and small pills. Tries to eat foods that are easy to swallow such as chicken and vegetables.   Insomnia- ongoing. Takes melatonin at 10pm and wakes up around 3-4am. Is not tired throughout the day and does not take naps. Reports very little exercise  Review of Systems:  Review of Systems  Constitutional: Negative for chills, fever and weight loss.  HENT: Negative for tinnitus.   Eyes: Positive for redness (R eye irritated).  Respiratory: Negative for cough, sputum production and shortness of breath.   Cardiovascular: Negative for chest pain, palpitations and leg swelling.  Gastrointestinal: Negative for abdominal pain, constipation, diarrhea, heartburn, nausea and vomiting.  Genitourinary: Negative  for dysuria, frequency and urgency.  Musculoskeletal: Negative for back pain, falls, joint pain and myalgias.  Skin: Negative.   Neurological: Positive for tingling (R side of hand). Negative for dizziness, weakness and headaches.  Psychiatric/Behavioral: Negative for depression and memory loss. The patient does not have insomnia.     Past Medical History:  Diagnosis Date  . Anxiety disorder, unspecified   . Arthritis   . Cancer [facility])    prostate  . Cataract   . Edema   . Erectile dysfunction   . Glaucoma   . History of blood in urine   . History of cataract surgery 09/26/2015   Both Eyes by Dr.Hunt.  . History of colonoscopy 09/26/2007   Dr.LeBaur  . Indigestion   . Knee pain   . Pain in unspecified hip   . Prostate cancer ([place])   . Urinary incontinence    Past Surgical History:  Procedure Laterality Date  . CATARACT EXTRACTION W/PHACO Right 01/20/2016   Procedure: CATARACT EXTRACTION PHACO AND INTRAOCULAR LENS PLACEMENT (IOC);  Surgeon: Tonny Branch, MD;  Location: [facility];  Service: Ophthalmology;  Laterality: Right;  CDE 15.42  . CATARACT EXTRACTION W/PHACO Left 03/06/2016   Procedure: CATARACT EXTRACTION PHACO AND INTRAOCULAR LENS PLACEMENT LEFT EYE;  CDE:  15.81;  Surgeon: Tonny Branch, MD;  Location: [facility];  Service: Ophthalmology;  Laterality: Left;  . CHOLECYSTECTOMY  2011  . COLONOSCOPY  07/03/2008  . FRACTURE SURGERY  09/25/1966   [facility].   . GALLBLADDER SURGERY  09/26/2007   Dr.Jenkins   . HERNIA REPAIR Right 09/26/1991   Dr.Smith   .  PROSTATE SURGERY     cancer  . SUPRAPUBIC PROSTATECTOMY  09/26/1995   Dr.Wrenn  . VASECTOMY  09/25/1972   [facility].    Social History:   reports that he quit smoking about 16 years ago. His smoking use included cigarettes and cigars. He has a 8.00 pack-year smoking history. He has never used smokeless tobacco. He reports current alcohol use of about 1.0 standard drink of alcohol per week. He reports that he  does not use drugs.  Family History  Problem Relation Age of Onset  . Healthy Mother   . Hypertension Mother   . Dementia Mother   . Heart disease Father   . Dementia Father   . Heart failure Father   . Heart disease Brother   . Stroke Brother   . Hypertension Son   . Stroke Brother     Medications: Patient's Medications  New Prescriptions   No medications on file  Previous Medications   ACETAMINOPHEN (TYLENOL) 325 MG TABLET    Take 650 mg by mouth every 6 (six) hours as needed.   CALCIUM CARBONATE (TUMS EX) 750 MG CHEWABLE TABLET    Chew 1 tablet by mouth as needed for heartburn.   HOMEOPATHIC PRODUCTS (YEAST-GARD HOMEOPATHIC) GEL    1 application daily. Apply to genitals   MAGNESIUM 500 MG CAPS    Take 1 capsule by mouth daily.   MELATONIN 3 MG TABS TABLET    Take 3 mg by mouth at bedtime.   MENTHOL, TOPICAL ANALGESIC, (BIOFREEZE) 4 % GEL    Apply topically daily as needed.  Modified Medications   No medications on file  Discontinued Medications   VALACYCLOVIR (VALTREX) 1000 MG TABLET    Take 1 tablet (1,000 mg total) by mouth 2 (two) times daily.    Physical Exam:  Vitals:   10/20/20 1304  BP: 140/80  Pulse: (!) 58  Temp: (!) 97.3 F (36.3 C)  TempSrc: Temporal  SpO2: 98%  Weight: 175 lb 9.6 oz (79.7 kg)  Height: 5\' 7"  (1.702 m)   Body mass index is 27.5 kg/m. Wt Readings from Last 3 Encounters:  10/20/20 175 lb 9.6 oz (79.7 kg)  10/05/20 173 lb 12.8 oz (78.8 kg)  04/14/20 176 lb 6.4 oz (80 kg)    Physical Exam Constitutional:      General: He is not in acute distress.    Appearance: He is well-developed. He is not diaphoretic.  HENT:     Head: Normocephalic and atraumatic.  Eyes:     Conjunctiva/sclera: Conjunctivae normal.     Pupils: Pupils are equal, round, and reactive to light.  Cardiovascular:     Rate and Rhythm: Normal rate and regular rhythm.     Heart sounds: Normal heart sounds.  Pulmonary:     Effort: Pulmonary effort is normal.      Breath sounds: Normal breath sounds.  Abdominal:     General: Bowel sounds are normal.     Palpations: Abdomen is soft.  Musculoskeletal:        General: No tenderness.     Cervical back: Normal range of motion and neck supple.  Skin:    General: Skin is warm and dry.  Neurological:     Mental Status: He is alert and oriented to person, place, and time.  Psychiatric:        Mood and Affect: Mood normal.        Behavior: Behavior normal.     Labs reviewed: Basic Metabolic  Panel: Recent Labs    11/14/19 0000 04/08/20 0856  NA 139 142  K 4.2 4.4  CL 103 105  CO2 27 28  GLUCOSE 79 99  BUN 15 14  CREATININE 1.13* 1.34*  CALCIUM 9.0 9.3   Liver Function Tests: Recent Labs    04/08/20 0856  AST 20  ALT 16  BILITOT 0.9  PROT 6.5   No results for input(s): LIPASE, AMYLASE in the last 8760 hours. No results for input(s): AMMONIA in the last 8760 hours. CBC: Recent Labs    04/08/20 0856  WBC 5.1  NEUTROABS 2,749  HGB 14.9  HCT 42.5  MCV 89.1  PLT 184   Lipid Panel: Recent Labs    04/08/20 0856  CHOL 177  HDL 51  LDLCALC 111*  TRIG 66  CHOLHDL 3.5   TSH: No results for input(s): TSH in the last 8760 hours. A1C: No results found for: HGBA1C   Assessment/Plan 1. History of prostate cancer Continues to follow up with urology, Dr. [name]  2. Dysphagia, unspecified type Ongoing, barium swallow was completed without abnormal findings, discussed GI referral but he has decline at this time. Will continue current diet modifications. Weight has been stable.   3. Osteoarthritis of multiple joints, unspecified osteoarthritis type Ongoing, continue being active and taking tylenol for pain relief.   4. Insomnia, unspecified type Continue taking melatonin. Encourage to keep lights and tv off at night, no distractions. Also encouraged to become more active throughout the day to help with sleep routine.  5. Hyperlipidemia, unspecified hyperlipidemia  type Controlled on current diet, continue eating healthy foods  6. Essential hypertension Stable, continue with healthy diet  7. Chronic nonintractable headache, unspecified headache type Ongoing and stable. encouraged to stay hydrated and active throughout the day.  8. Stage 3a chronic kidney disease (HCC) Encourage hydration, avoids NSAIDS  9. Post herpetic neuralgia Will monitor, pain not severe enough for medication    Next appt: 6 months Dajane Valli K. Marisa Hufstetler, [facility] [phone]   I personally was present during the history, physical exam and medical decision-making activities of this service and have verified that the service and findings are accurately documented in the student's note

## 2020-10-25 ENCOUNTER — Telehealth: Payer: Self-pay | Admitting: *Deleted

## 2020-10-25 ENCOUNTER — Other Ambulatory Visit: Payer: Self-pay

## 2020-10-25 ENCOUNTER — Encounter: Payer: Self-pay | Admitting: Family

## 2020-10-25 ENCOUNTER — Ambulatory Visit (INDEPENDENT_AMBULATORY_CARE_PROVIDER_SITE_OTHER): Payer: Medicare HMO | Admitting: Family

## 2020-10-25 VITALS — BP 130/80 | HR 60 | Temp 97.8°F | Resp 16 | Ht 67.0 in | Wt 175.2 lb

## 2020-10-25 DIAGNOSIS — B029 Zoster without complications: Secondary | ICD-10-CM | POA: Diagnosis not present

## 2020-10-25 MED ORDER — CALAMINE EX LOTN: 1.0000 "application " | TOPICAL_LOTION | Freq: Three times a day (TID) | CUTANEOUS | 0 refills | Status: DC

## 2020-10-25 MED ORDER — VALACYCLOVIR HCL 1 G PO TABS: 1000.0000 mg | ORAL_TABLET | Freq: Two times a day (BID) | ORAL | 0 refills | Status: DC

## 2020-10-25 NOTE — Telephone Encounter (Signed)
Fraser Din, wife, called and stated that patient has new patches of shingles. Wife is wondering if they should get the Valtrex renewed and continue on medication.   Also wants to know if is is ok to go ahead and get the shingles shot or do they need to wait until no more new patches.   Please Advise.      Assessment/Plan 1. Herpes zoster without complication - pain in both areas prior to rash development, rash one sided with small blister formation - valACYclovir (VALTREX) 1000 MG tablet; Take 1 tablet (1,000 mg total) by mouth 2 (two) times daily.  Dispense: 14 tablet; Refill: 0  - advised to reach out to PCP if develops around eye, or symptoms worsen - recommend shingles vaccine (Shingrix) at local pharmacy after rash resolves

## 2020-10-25 NOTE — Telephone Encounter (Signed)
Kevin [name], wife, notified and scheduled an appointment for today with [place].

## 2020-10-25 NOTE — Progress Notes (Signed)
Provider: Navin Dogan FNP-C  Lauree Chandler, NP  Patient Care Team: Lauree Chandler, NP as PCP - General (Geriatric Medicine) [name], MD as Consulting Physician (Cardiology) [name], MD as Attending Physician (Urology)  Extended Emergency Contact Information Primary Emergency Contact: [name],[name] Address: [address]          [place], [place] [address] [name] of [place] Phone: [phone] Work Phone: [phone] Mobile Phone: [phone] Relation: Spouse Secondary Emergency Contact: [place] of [place] Work Phone: [phone] Mobile Phone: [phone] Relation: Son  Code Status: Full Code  Goals of care: Advanced Directive information Advanced Directives 10/25/2020  Does Patient Have a Medical Advance Directive? Yes  Type of Advance Directive Living will  Does patient want to make changes to medical advance directive? No - Patient declined  Copy of [place] in Chart? -  Would patient like information on creating a medical advance directive? -     Chief Complaint  Patient presents with  . Acute Visit    Complains of new shingles patch.     HPI:  Pt is a 85 y.o. male seen today for an acute visit for evaluation of right thigh rash.described as itchy and irritated rash. Has had no fever,chills Has had had some headaches .tylenol helps. No drainage from the rash.  Rash has resolved on the right hand where it started.    Past Medical History:  Diagnosis Date  . Anxiety disorder, unspecified   . Arthritis   . Cancer [facility])    prostate  . Cataract   . Edema   . Erectile dysfunction   . Glaucoma   . History of blood in urine   . History of cataract surgery 09/26/2015   Both Eyes by Dr.Hunt.  . History of colonoscopy 09/26/2007   Dr.LeBaur  . Indigestion   . Knee pain   . Pain in unspecified hip   . Prostate cancer ([place])   . Urinary incontinence    Past Surgical History:   Procedure Laterality Date  . CATARACT EXTRACTION W/PHACO Right 01/20/2016   Procedure: CATARACT EXTRACTION PHACO AND INTRAOCULAR LENS PLACEMENT (IOC);  Surgeon: Tonny Branch, MD;  Location: [facility];  Service: Ophthalmology;  Laterality: Right;  CDE 15.42  . CATARACT EXTRACTION W/PHACO Left 03/06/2016   Procedure: CATARACT EXTRACTION PHACO AND INTRAOCULAR LENS PLACEMENT LEFT EYE;  CDE:  15.81;  Surgeon: Tonny Branch, MD;  Location: [facility];  Service: Ophthalmology;  Laterality: Left;  . CHOLECYSTECTOMY  2011  . COLONOSCOPY  07/03/2008  . FRACTURE SURGERY  09/25/1966   [facility].   . GALLBLADDER SURGERY  09/26/2007   Dr.Jenkins   . HERNIA REPAIR Right 09/26/1991   Dr.Smith   . PROSTATE SURGERY     cancer  . SUPRAPUBIC PROSTATECTOMY  09/26/1995   Dr.Wrenn  . VASECTOMY  09/25/1972   [facility]     No Known Allergies  Outpatient Encounter Medications as of 10/25/2020  Medication Sig  . acetaminophen (TYLENOL) 325 MG tablet Take 650 mg by mouth every 6 (six) hours as needed.  . calcium carbonate (TUMS EX) 750 MG chewable tablet Chew 1 tablet by mouth as needed for heartburn.  . Homeopathic Products (YEAST-GARD HOMEOPATHIC) GEL 1 application daily. Apply to genitals  . Magnesium 500 MG CAPS Take 1 capsule by mouth daily.  . melatonin 3 MG TABS tablet Take 3 mg by mouth at bedtime.  . Menthol, Topical Analgesic, (BIOFREEZE) 4 % GEL Apply topically  daily as needed.   No facility-administered encounter medications on file as of 10/25/2020.    Review of Systems  Constitutional: Negative for appetite change, chills, fatigue and fever.  Respiratory: Negative for cough, chest tightness, shortness of breath and wheezing.   Cardiovascular: Negative for chest pain, palpitations and leg swelling.  Gastrointestinal: Negative for abdominal pain, diarrhea, nausea and vomiting.  Skin: Positive for rash. Negative for color change and pallor.       Right thigh rash   Neurological:  Negative for dizziness, speech difficulty, weakness, light-headedness and numbness.  Hematological: Does not bruise/bleed easily.  Psychiatric/Behavioral: Negative for agitation, behavioral problems and sleep disturbance. The patient is not nervous/anxious.     Immunization History  Administered Date(s) Administered  . Influenza Split 07/06/2016, 07/03/2017, 05/27/2019  . Influenza, High Dose Seasonal PF 07/20/2020  . Influenza-Unspecified 09/25/2018  . Moderna SARS-COV2 Booster Vaccination 07/22/2020  . Moderna Sars-Covid-2 Vaccination 10/31/2019, 11/29/2019  . Pneumococcal Polysaccharide-23 01/02/2017   Pertinent  Health Maintenance Due  Topic Date Due  . PNA vac Low Risk Adult (2 of 2 - PCV13) 01/02/2018  . INFLUENZA VACCINE  Completed   Fall Risk  10/25/2020 10/20/2020 04/14/2020 02/25/2020 02/13/2020  Falls in the past year? 0 0 0 0 0  Number falls in past yr: 0 0 0 0 0  Injury with Fall? 0 - 0 0 0   Functional Status Survey:    Vitals:   10/25/20 1320  BP: 130/80  Pulse: 60  Resp: 16  Temp: 97.8 F (36.6 C)  SpO2: 98%  Weight: 175 lb 3.2 oz (79.5 kg)  Height: 5\' 7"  (1.702 m)   Body mass index is 27.44 kg/m. Physical Exam Vitals reviewed.  Constitutional:      General: He is not in acute distress.    Appearance: He is overweight. He is not ill-appearing.  HENT:     Head: Normocephalic.  Eyes:     General: No scleral icterus.       Right eye: No discharge.        Left eye: No discharge.     Extraocular Movements: Extraocular movements intact.     Conjunctiva/sclera: Conjunctivae normal.     Pupils: Pupils are equal, round, and reactive to light.  Cardiovascular:     Rate and Rhythm: Normal rate and regular rhythm.     Pulses: Normal pulses.     Heart sounds: Normal heart sounds. No murmur heard. No friction rub. No gallop.   Pulmonary:     Effort: Pulmonary effort is normal. No respiratory distress.     Breath sounds: Normal breath sounds. No wheezing,  rhonchi or rales.  Chest:     Chest wall: No tenderness.  Skin:    General: Skin is warm and dry.     Coloration: Skin is not pale.       Neurological:     Mental Status: He is alert and oriented to person, place, and time.     Labs reviewed: Recent Labs    11/14/19 0000 04/08/20 0856  NA 139 142  K 4.2 4.4  CL 103 105  CO2 27 28  GLUCOSE 79 99  BUN 15 14  CREATININE 1.13* 1.34*  CALCIUM 9.0 9.3   Recent Labs    04/08/20 0856  AST 20  ALT 16  BILITOT 0.9  PROT 6.5   Recent Labs    04/08/20 0856  WBC 5.1  NEUTROABS 2,749  HGB 14.9  HCT 42.5  MCV 89.1  PLT 184   Lab Results  Component Value Date   TSH 1.92 01/09/2017   No results found for: HGBA1C Lab Results  Component Value Date   CHOL 177 04/08/2020   HDL 51 04/08/2020   LDLCALC 111 (H) 04/08/2020   TRIG 66 04/08/2020   CHOLHDL 3.5 04/08/2020    Significant Diagnostic Results in last 30 days:  No results found.  Assessment/Plan Herpes zoster without complication Afebrile.Erythematous rash with small rash.  - advised to apply cool compressor to rash site to relief burning and itching. - Calamine anti-itch lotion as below - start on valacyclovir as below   - calamine lotion; Apply 1 application topically 3 (three) times daily. Affected areas on thigh  Dispense: 120 mL; Refill: 0 - valACYclovir (VALTREX) 1000 MG tablet; Take 1 tablet (1,000 mg total) by mouth 2 (two) times daily.  Dispense: 20 tablet; Refill: 0  Family/ staff Communication: Reviewed plan of care with patient  Labs/tests ordered: None   Next Appointment: As needed if symptoms worsen or fail to improve  Sandrea Hughs, NP

## 2020-10-25 NOTE — Patient Instructions (Signed)
- Apply Calamine lotion to affected areas on thigh for itching.  Shingles  Shingles is an infection. It gives you a painful skin rash and blisters that have fluid in them. Shingles is caused by the same germ (virus) that causes chickenpox. Shingles only happens in people who:  Have had chickenpox.  Have been given a shot of medicine (vaccine) to protect against chickenpox. Shingles is rare in this group. The first symptoms of shingles may be itching, tingling, or pain in an area on your skin. A rash will show on your skin a few days or weeks later. The rash is likely to be on one side of your body. The rash usually has a shape like a belt or a band. Over time, the rash turns into fluid-filled blisters. The blisters will break open, change into scabs, and dry up. Medicines may:  Help with pain and itching.  Help you get better sooner.  Help to prevent long-term problems. Follow these instructions at home: Medicines  Take over-the-counter and prescription medicines only as told by your doctor.  Put on an anti-itch cream or numbing cream where you have a rash, blisters, or scabs. Do this as told by your doctor. Helping with itching and discomfort  Put cold, wet cloths (cold compresses) on the area of the rash or blisters as told by your doctor.  Cool baths can help you feel better. Try adding baking soda or dry oatmeal to the water to lessen itching. Do not bathe in hot water.   Blister and rash care  Keep your rash covered with a loose bandage (dressing).  Wear loose clothing that does not rub on your rash.  Keep your rash and blisters clean. To do this, wash the area with mild soap and cool water as told by your doctor.  Check your rash every day for signs of infection. Check for: ? More redness, swelling, or pain. ? Fluid or blood. ? Warmth. ? Pus or a bad smell.  Do not scratch your rash. Do not pick at your blisters. To help you to not scratch: ? Keep your fingernails  clean and cut short. ? Wear gloves or mittens when you sleep, if scratching is a problem. General instructions  Rest as told by your doctor.  Keep all follow-up visits as told by your doctor. This is important.  Wash your hands often with soap and water. If soap and water are not available, use hand sanitizer. Doing this lowers your chance of getting a skin infection caused by germs (bacteria).  Your infection can cause chickenpox in people who have never had chickenpox or never got a shot of chickenpox vaccine. If you have blisters that did not change into scabs yet, try not to touch other people or be around other people, especially: ? Babies. ? Pregnant women. ? Children who have areas of red, itchy, or rough skin (eczema). ? Very old people who have transplants. ? People who have a long-term (chronic) sickness, like cancer or AIDS. Contact a doctor if:  Your pain does not get better with medicine.  Your pain does not get better after the rash heals.  You have any signs of infection in the rash area. These signs include: ? More redness, swelling, or pain around the rash. ? Fluid or blood coming from the rash. ? The rash area feeling warm to the touch. ? Pus or a bad smell coming from the rash. Get help right away if:  The rash is on your  face or nose.  You have pain in your face or pain by your eye.  You lose feeling on one side of your face.  You have trouble seeing.  You have ear pain, or you have ringing in your ear.  You have a loss of taste.  Your condition gets worse. Summary  Shingles gives you a painful skin rash and blisters that have fluid in them.  Shingles is an infection. It is caused by the same germ (virus) that causes chickenpox.  Keep your rash covered with a loose bandage (dressing). Wear loose clothing that does not rub on your rash.  If you have blisters that did not change into scabs yet, try not to touch other people or be around  people. This information is not intended to replace advice given to you by your health care provider. Make sure you discuss any questions you have with your health care provider. Document Revised: 01/03/2019 Document Reviewed: 05/16/2017 Elsevier Patient Education  2021 [employer].

## 2020-10-25 NOTE — Telephone Encounter (Signed)
No to shingles vaccine at this time. Where is the new patches/rash? Can he come to the office to be evaluated?

## 2020-12-06 IMAGING — RF DG SWALLOWING FUNCTION
12 of 22 series · 12 of 24 positions shown · non-contrast
Comparison: None.

CLINICAL DATA: Dysphagia. Cough/GE reflux disease/other secondary
diagnosis

EXAM:
MODIFIED BARIUM SWALLOW
TECHNIQUE: Different consistencies of barium were administered orally to the
patient by the Speech Pathologist. Imaging of the pharynx was
performed in the lateral projection. The radiologist was present in
the fluoroscopy room for this study, providing personal supervision.
FLUOROSCOPY TIME:  Fluoroscopy Time:  2 minutes and 30 second
Radiation Exposure Index (if provided by the fluoroscopic device):
23.7 mGy

[Series 2: run · 1 of 42 frames shown (1 of 12)]
[frame 1/42]
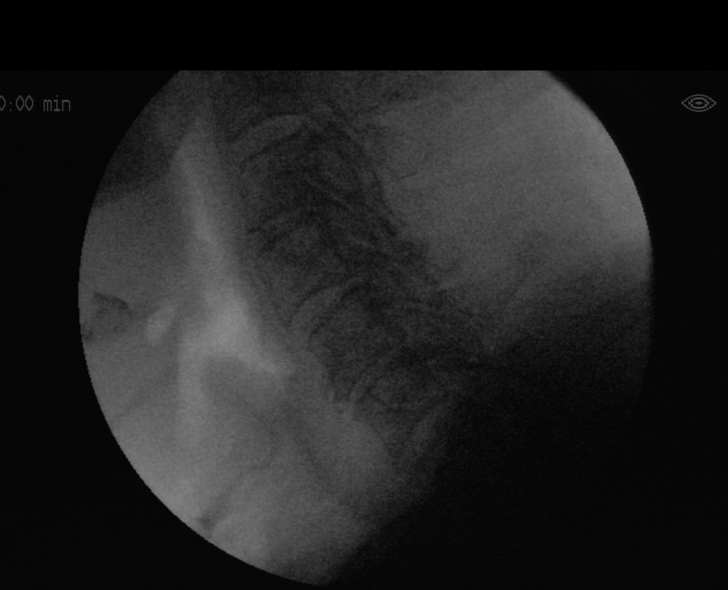

[Series 3: run · 1 of 39 frames shown (2 of 12)]
[frame 34/39]
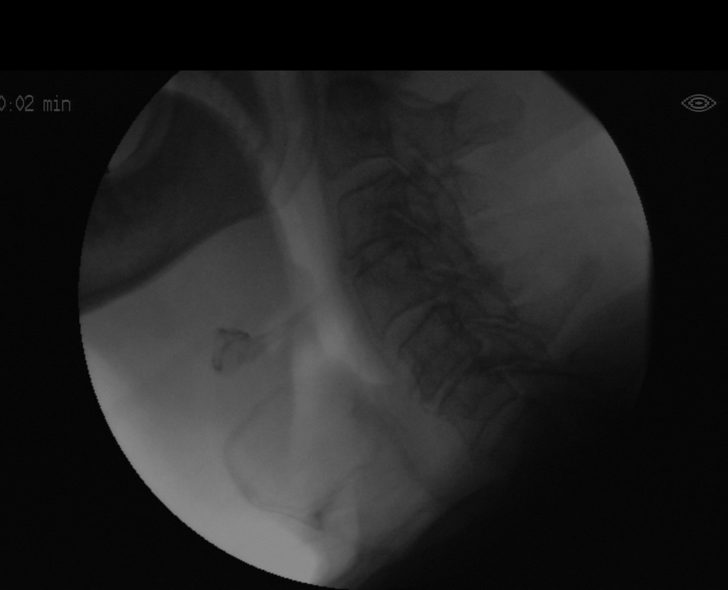

[Series 5: run · 1 of 200 frames shown (3 of 12)]
[frame 171/200]
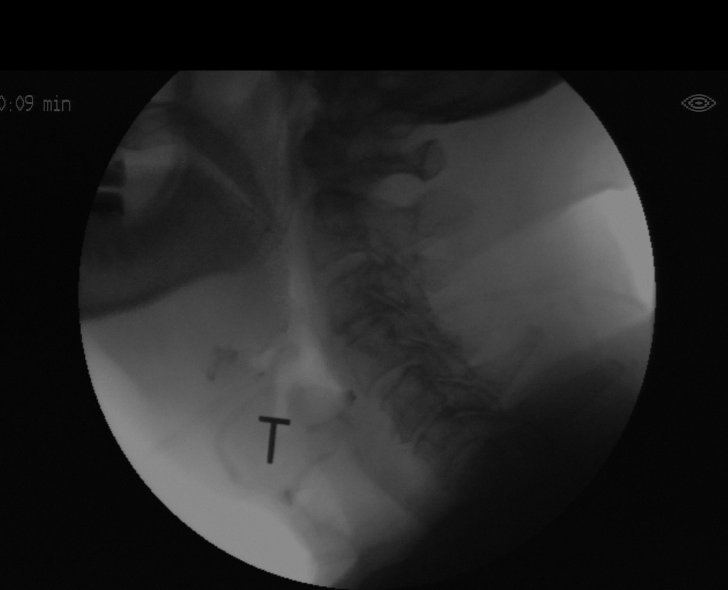

[Series 7: run · 1 of 217 frames shown (4 of 12)]
[frame 109/217]
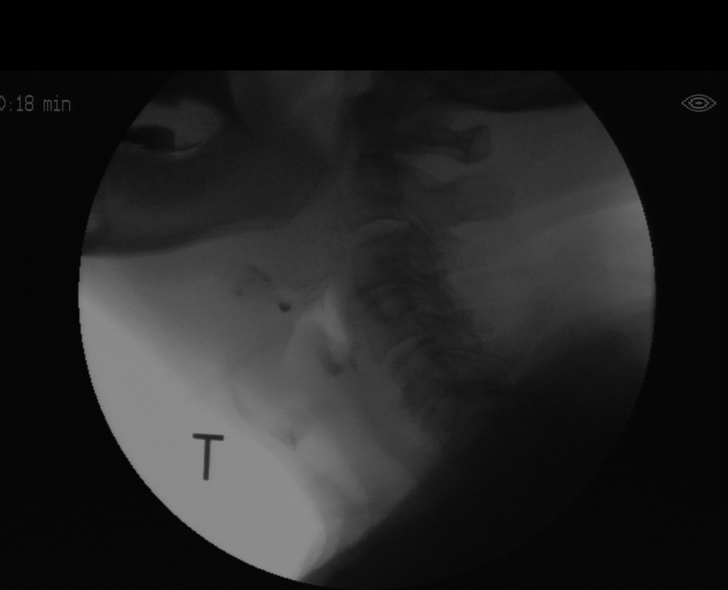

[Series 9: run · 1 of 203 frames shown (5 of 12)]
[frame 102/203]
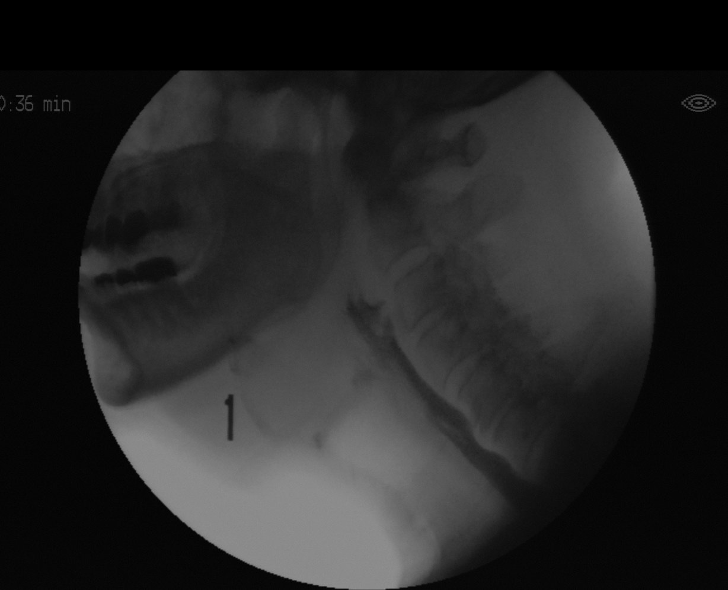

[Series 11: run · 1 of 209 frames shown (6 of 12)]
[frame 105/209]
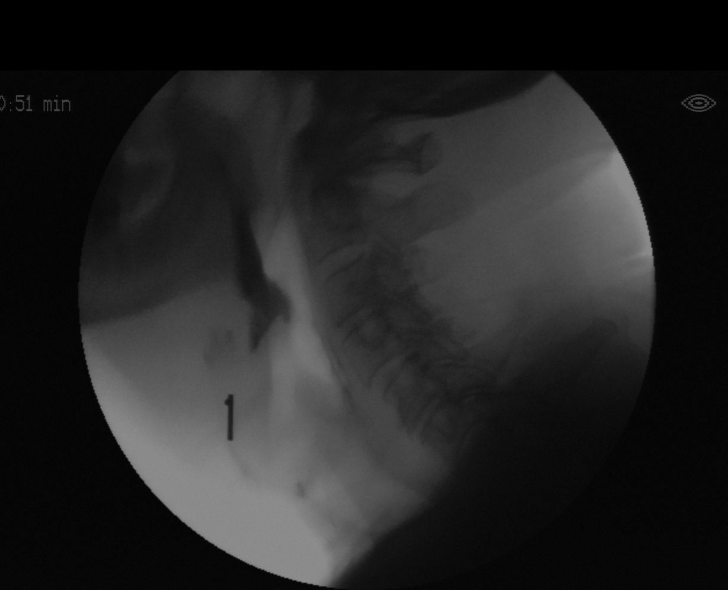

[Series 13: run · 1 of 423 frames shown (7 of 12)]
[frame 212/423]
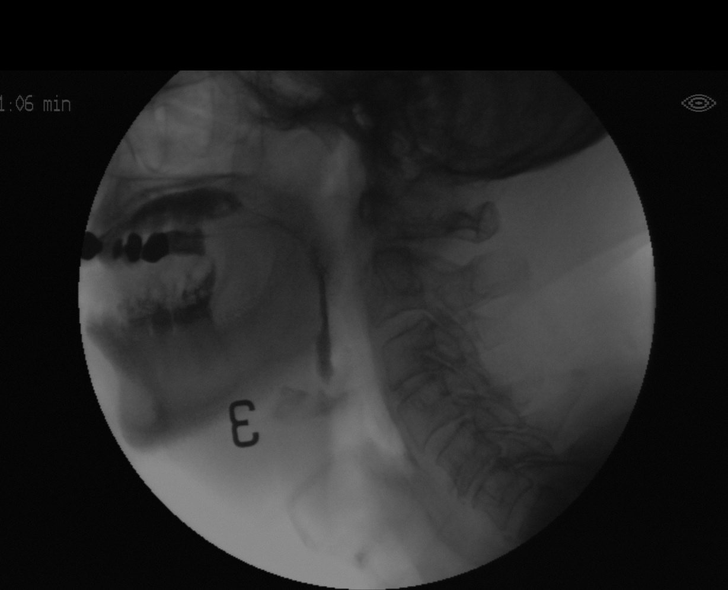

[Series 15: run · 1 of 557 frames shown (8 of 12)]
[frame 279/557]
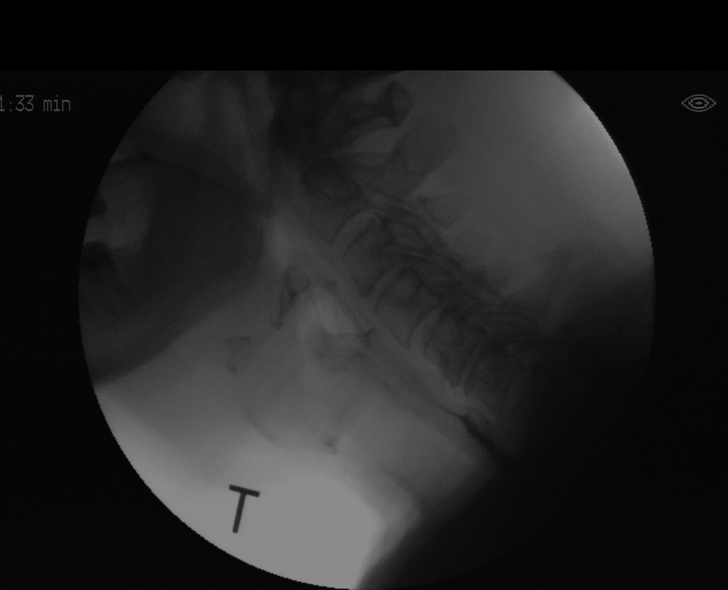

[Series 17: run · 1 of 102 frames shown (9 of 12)]
[frame 1/102]
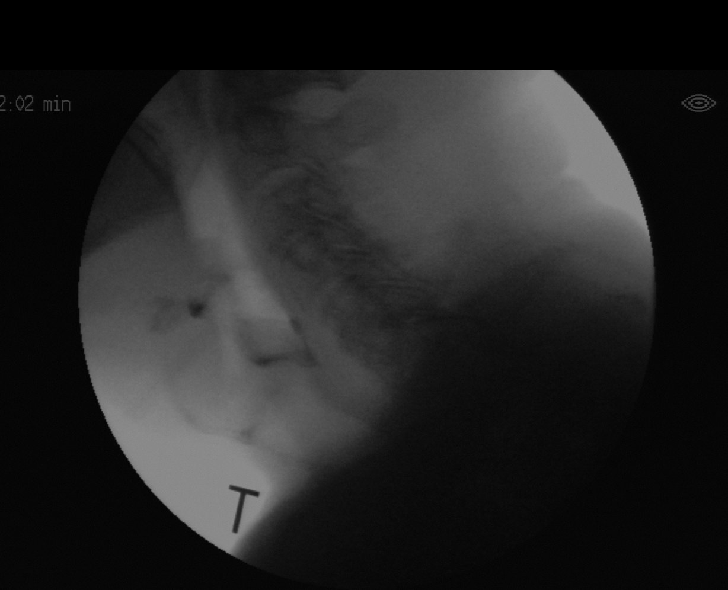

[Series 19: run · 1 of 193 frames shown (10 of 12)]
[frame 29/193]
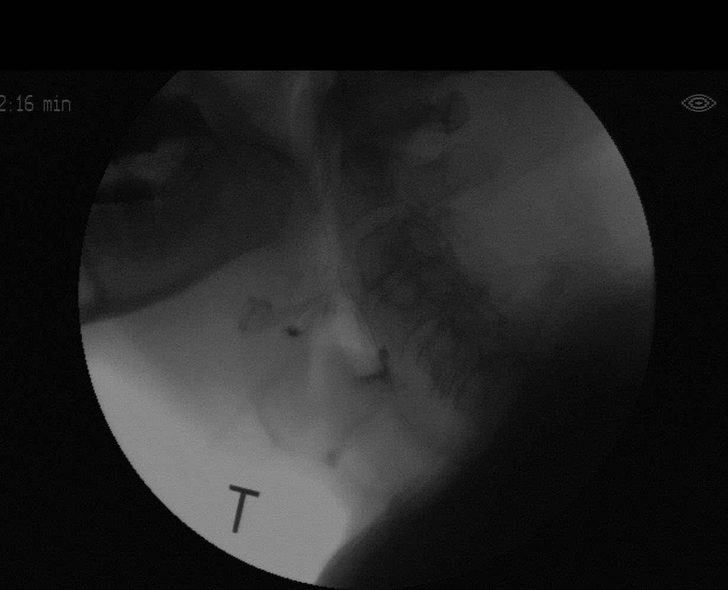

[Series 20: run · 1 of 16 frames shown (11 of 12)]
[frame 16/16]
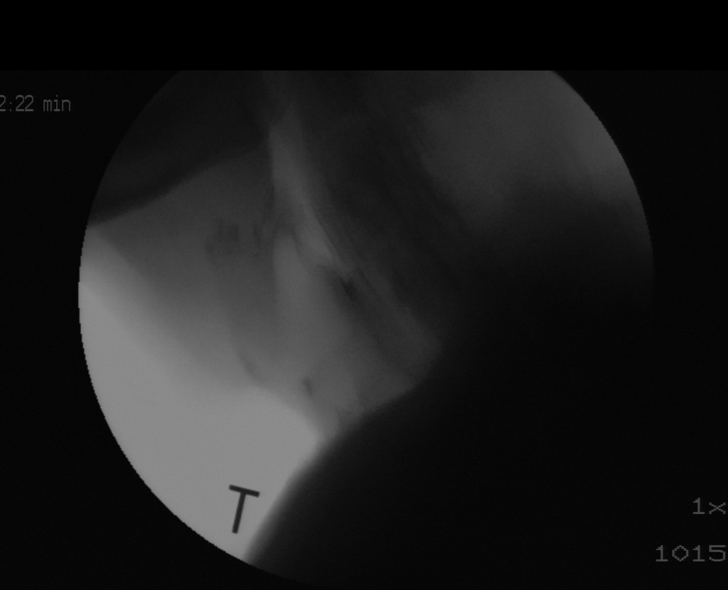

[Series 22: run · 1 of 55 frames shown (12 of 12)]
[frame 47/55]
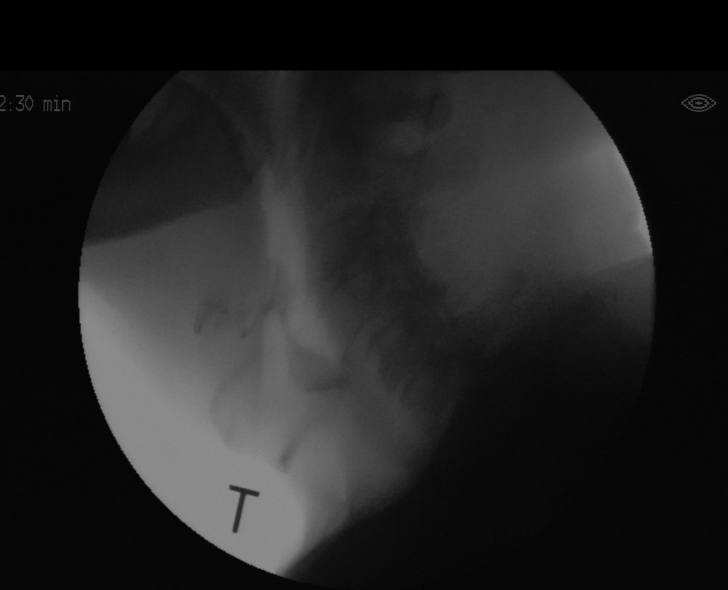

[12 of 24 positions shown; findings below may reference images not displayed]

FINDINGS/IMPRESSION:
Please refer to the Speech Pathologists report for complete details
and recommendations.

## 2021-01-13 ENCOUNTER — Other Ambulatory Visit: Payer: Self-pay

## 2021-01-13 ENCOUNTER — Ambulatory Visit (INDEPENDENT_AMBULATORY_CARE_PROVIDER_SITE_OTHER): Payer: Medicare HMO | Admitting: Urology

## 2021-01-13 VITALS — BP 148/71 | HR 88 | Temp 97.6°F | Ht 67.5 in | Wt 180.0 lb

## 2021-01-13 DIAGNOSIS — N393 Stress incontinence (female) (male): Secondary | ICD-10-CM | POA: Diagnosis not present

## 2021-01-13 DIAGNOSIS — R310 Gross hematuria: Secondary | ICD-10-CM | POA: Diagnosis not present

## 2021-01-13 DIAGNOSIS — Z8546 Personal history of malignant neoplasm of prostate: Secondary | ICD-10-CM

## 2021-01-13 LAB — URINALYSIS, ROUTINE W REFLEX MICROSCOPIC
Bilirubin, UA: NEGATIVE
Glucose, UA: NEGATIVE
Ketones, UA: NEGATIVE
Leukocytes,UA: NEGATIVE
Nitrite, UA: NEGATIVE
Protein,UA: NEGATIVE
RBC, UA: NEGATIVE
Specific Gravity, UA: 1.01 (ref 1.005–1.030)
Urobilinogen, Ur: 0.2 mg/dL (ref 0.2–1.0)
pH, UA: 7 (ref 5.0–7.5)

## 2021-01-13 NOTE — Progress Notes (Signed)
Urological Symptom Review  Patient is experiencing the following symptoms: Frequent urination Hard to postpone urination Get up at night to urinate Leakage of urine Stream starts and stops Erection problems (male only)   Review of Systems  Gastrointestinal (upper)  : Negative for upper GI symptoms  Gastrointestinal (lower) : Negative for lower GI symptoms  Constitutional : Night Sweats  Skin: Negative for skin symptoms  Eyes: Negative for eye symptoms  Ear/Nose/Throat : Negative for Ear/Nose/Throat symptoms  Hematologic/Lymphatic: Negative for Hematologic/Lymphatic symptoms  Cardiovascular : Leg swelling  Respiratory : Negative for respiratory symptoms  Endocrine: Negative for endocrine symptoms  Musculoskeletal: Joint pain  Neurological: Headaches  Psychologic: Negative for psychiatric symptoms

## 2021-01-13 NOTE — Progress Notes (Signed)
Subjective: 1. Gross hematuria   2. History of prostate cancer     01/13/21: Gumecindo returns today in f/u.  He had a negative hematuria w/u last year.  His PSA in 2/21 was stable at 0.4.  He had a radical prostatectomy in 1997.  He has incontinence and is using 3ppd.  He has worse leakage with activity. His IPSS is 17.     01/16/20: Mr. Hornaday is an 85 yo WM who is sent back in consultation by Dr. Holly Bodily for an episode of gross hematuria in November.   He had blood on 3 voids with dysuria.  He has not seen it since.   He has no history of stones or UTI's.  He had some low back pain at that time as well.  He has a history of prostate cancer.  He had a radical prostatectomy in about 1997 and was last seen by me in 2002.   His recent PSA was 0.5.  The last I had recorded was 0.03.   He has had postop SUI and ED.   His UA was clear on 09/04/19 and today.   His Cr was 1.2 on 09/04/19.  He has moderate to severe LUTS with an IPSS of 22.  He wears about 3ppd.    ROS:  Review of Systems    No Known Allergies  Past Medical History:  Diagnosis Date  . Anxiety disorder, unspecified   . Arthritis   . Cancer [facility])    prostate  . Cataract   . Edema   . Erectile dysfunction   . Glaucoma   . History of blood in urine   . History of cataract surgery 09/26/2015   Both Eyes by Dr.Hunt.  . History of colonoscopy 09/26/2007   Dr.LeBaur  . Indigestion   . Knee pain   . Pain in unspecified hip   . Prostate cancer ([place])   . Urinary incontinence     Past Surgical History:  Procedure Laterality Date  . CATARACT EXTRACTION W/PHACO Right 01/20/2016   Procedure: CATARACT EXTRACTION PHACO AND INTRAOCULAR LENS PLACEMENT (IOC);  Surgeon: Tonny Branch, MD;  Location: [facility]S;  Service: Ophthalmology;  Laterality: Right;  CDE 15.42  . CATARACT EXTRACTION W/PHACO Left 03/06/2016   Procedure: CATARACT EXTRACTION PHACO AND INTRAOCULAR LENS PLACEMENT LEFT EYE;  CDE:  15.81;  Surgeon: Tonny Branch, MD;  Location: [facility]  ORS;  Service: Ophthalmology;  Laterality: Left;  . CHOLECYSTECTOMY  2011  . COLONOSCOPY  07/03/2008  . FRACTURE SURGERY  09/25/1966   [facility].   . GALLBLADDER SURGERY  09/26/2007   Dr.Jenkins   . HERNIA REPAIR Right 09/26/1991   Dr.Smith   . PROSTATE SURGERY     cancer  . SUPRAPUBIC PROSTATECTOMY  09/26/1995   Dr.Markese Bloxham  . VASECTOMY  09/25/1972   [place] History   Socioeconomic History  . Marital status: Married    Spouse name: Not on file  . Number of children: 3  . Years of education: Not on file  . Highest education level: Not on file  Occupational History  . Occupation: retired  Tobacco Use  . Smoking status: Former Smoker    Packs/day: 0.50    Years: 16.00    Pack years: 8.00    Types: Cigarettes, Cigars    Quit date: 01/18/2004    Years since quitting: 17.0  . Smokeless tobacco: Never Used  Vaping Use  . Vaping Use: Never used  Substance and  Sexual Activity  . Alcohol use: Yes    Alcohol/week: 1.0 standard drink    Types: 1 Cans of beer per week    Comment: 1 beer week  . Drug use: No  . Sexual activity: Not Currently    Birth control/protection: None  Other Topics Concern  . Not on file  Social History Narrative   Tobacco use, amount per day now: Non smoker for 16 years.   Past tobacco use, amount per day: 1/2 Pack of cigarettes a day or 3 cigars.    How many years did you use tobacco: 15 years.   Alcohol use (drinks per week): 0-2 drinks.   Diet: Normal.    Do you drink/eat things with caffeine: Yes.   Marital status:  Married                                What year were you married? 1981   Do you live in a house, apartment, assisted living, condo, trailer, etc.? House.   Is it one or more stories? 2 stories.    How many persons live in your home? 2   Do you have pets in your home?( please list)  Yes 2 dogs, 1 cat.   Highest Level of education completed: High School GED.   Current or past profession: Health and safety inspector.    Do you exercise? Not regularly.                                    Type and how often? Working outside on yard.    Do you have a living will? Yes.   Do you have a DNR form?  Yes.                                 If not, do you want to discuss one?   Do you have signed POA/HPOA forms? Yes.                       If so, please bring to you appointment    Do you have any difficulty bathing or dressing yourself? No.    Do you have difficulty preparing food or eating? No.   Do you have difficulty managing your medications? No.   Do you have any difficulty managing your finances? No.    Do you have any difficulty affording your medications? No.       Social Determinants of Health   Financial Resource Strain: Not on file  Food Insecurity: Not on file  Transportation Needs: Not on file  Physical Activity: Not on file  Stress: Not on file  Social Connections: Not on file  Intimate Partner Violence: Not on file    Family History  Problem Relation Age of Onset  . Healthy Mother   . Hypertension Mother   . Dementia Mother   . Heart disease Father   . Dementia Father   . Heart failure Father   . Heart disease Brother   . Stroke Brother   . Hypertension Son   . Stroke Brother     Anti-infectives: Anti-infectives (From admission, onward)   None      Current Outpatient Medications  Medication Sig Dispense Refill  . acetaminophen (TYLENOL) 325 MG tablet Take  650 mg by mouth every 6 (six) hours as needed.    . calamine lotion Apply 1 application topically 3 (three) times daily. Affected areas on thigh 120 mL 0  . calcium carbonate (TUMS EX) 750 MG chewable tablet Chew 1 tablet by mouth as needed for heartburn.    . Homeopathic Products (YEAST-GARD HOMEOPATHIC) GEL 1 application daily. Apply to genitals    . Magnesium 500 MG CAPS Take 1 capsule by mouth daily.    . melatonin 3 MG TABS tablet Take 3 mg by mouth at bedtime.    . Menthol, Topical Analgesic,  (BIOFREEZE) 4 % GEL Apply topically daily as needed.    . valACYclovir (VALTREX) 1000 MG tablet Take 1 tablet (1,000 mg total) by mouth 2 (two) times daily. 20 tablet 0   No current facility-administered medications for this visit.     Objective: BP (!) 148/71   Pulse 88   Temp 97.6 F (36.4 C)   Ht 5' 7.5" (1.715 m)   Wt 180 lb (81.6 kg)   BMI 27.78 kg/m    Physical Exam  Lab Results:  Results for orders placed or performed in visit on 01/13/21 (from the past 24 hour(s))  Urinalysis, Routine w reflex microscopic     Status: None   Collection Time: 01/13/21 11:20 AM  Result Value Ref Range   Specific Gravity, UA 1.010 1.005 - 1.030   pH, UA 7.0 5.0 - 7.5   Color, UA Yellow Yellow   Appearance Ur Clear Clear   Leukocytes,UA Negative Negative   Protein,UA Negative Negative/Trace   Glucose, UA Negative Negative   Ketones, UA Negative Negative   RBC, UA Negative Negative   Bilirubin, UA Negative Negative   Urobilinogen, Ur 0.2 0.2 - 1.0 mg/dL   Nitrite, UA Negative Negative   Microscopic Examination Comment    Narrative   Performed at:  [facility] [address] [place], [place]  [id number] Lab Director: [place], Phone:  [phone]    BMET No results for input(s): NA, K, CL, CO2, GLUCOSE, BUN, CREATININE, CALCIUM in the last 72 hours. PT/INR No results for input(s): LABPROT, INR in the last 72 hours. ABG No results for input(s): PHART, HCO3 in the last 72 hours.  Invalid input(s): PCO2, PO2  Studies/Results: No results found.   Assessment/Plan: Gross hematuria.    He has had no recurrent bleeding.   Prostate cancer with rising PSA s/p prostatectomy.   His PSA was down slightly at 0.4 a year ago.  I will repeat the PSA today.  SUI.  I discussed PT, male slings and the AUS.   He is going to discuss that with his wife.   No orders of the defined types were placed in this encounter.    Orders Placed This Encounter  Procedures  .  Urinalysis, Routine w reflex microscopic  . PSA    Standing Status:   Future    Standing Expiration Date:   01/13/2022  . PSA     Return in about 1 year (around 01/13/2022) for with PSA.    CC: Dr. Benny Lennert.      [name] 01/13/2021 [phone]

## 2021-01-13 NOTE — Patient Instructions (Signed)
Prostate Cancer Screening  Prostate cancer screening is a test that is done to check for the presence of prostate cancer in men. The prostate gland is a walnut-sized gland that is located below the bladder and in front of the rectum in males. The function of the prostate is to add fluid to semen during ejaculation. Prostate cancer is the second most common type of cancer in men. Who should have prostate cancer screening?  Screening recommendations vary based on age and other risk factors. Screening is recommended if:  You are older than age 55. If you are age 55-69, talk with your health care provider about your need for screening and how often screening should be done. Because most prostate cancers are slow growing and will not cause death, screening is generally reserved in this age group for men who have a 10-15-year life expectancy.  You are younger than age 55, and you have these risk factors: ? Being a black male or a male of African descent. ? Having a father, brother, or uncle who has been diagnosed with prostate cancer. The risk is higher if your family member's cancer occurred at an early age. Screening is not recommended if:  You are younger than age 40.  You are between the ages of 40 and 54 and you have no risk factors.  You are 83 years of age or older. At this age, the risks that screening can cause are greater than the benefits that it may provide. If you are at high risk for prostate cancer, your health care provider may recommend that you have screenings more often or that you start screening at a younger age. How is screening for prostate cancer done? The recommended prostate cancer screening test is a blood test called the prostate-specific antigen (PSA) test. PSA is a protein that is made in the prostate. As you age, your prostate naturally produces more PSA. Abnormally high PSA levels may be caused by:  Prostate cancer.  An enlarged prostate that is not caused by cancer  (benign prostatic hyperplasia, BPH). This condition is very common in older men.  A prostate gland infection (prostatitis). Depending on the PSA results, you may need more tests, such as:  A physical exam to check the size of your prostate gland.  Blood and imaging tests.  A procedure to remove tissue samples from your prostate gland for testing (biopsy). What are the benefits of prostate cancer screening?  Screening can help to identify cancer at an early stage, before symptoms start and when the cancer can be treated more easily.  There is a small chance that screening may lower your risk of dying from prostate cancer. The chance is small because prostate cancer is a slow-growing cancer, and most men with prostate cancer die from a different cause. What are the risks of prostate cancer screening? The main risk of prostate cancer screening is diagnosing and treating prostate cancer that would never have caused any symptoms or problems. This is called overdiagnosisand overtreatment. PSA screening cannot tell you if your PSA is high due to cancer or a different cause. A prostate biopsy is the only procedure to diagnose prostate cancer. Even the results of a biopsy may not tell you if your cancer needs to be treated. Slow-growing prostate cancer may not need any treatment other than monitoring, so diagnosing and treating it may cause unnecessary stress or other side effects. A prostate biopsy may also cause:  Infection or fever.  A false negative. This is   a result that shows that you do not have prostate cancer when you actually do have prostate cancer. Questions to ask your health care provider  When should I start prostate cancer screening?  What is my risk for prostate cancer?  How often do I need screening?  What type of screening tests do I need?  How do I get my test results?  What do my results mean?  Do I need treatment? Where to find more information  The American Cancer  Society: www.cancer.org  American Urological Association: www.auanet.org Contact a health care provider if:  You have difficulty urinating.  You have pain when you urinate or ejaculate.  You have blood in your urine or semen.  You have pain in your back or in the area of your prostate. Summary  Prostate cancer is a common type of cancer in men. The prostate gland is located below the bladder and in front of the rectum. This gland adds fluid to semen during ejaculation.  Prostate cancer screening may identify cancer at an early stage, when the cancer can be treated more easily.  The prostate-specific antigen (PSA) test is the recommended screening test for prostate cancer.  Discuss the risks and benefits of prostate cancer screening with your health care provider. If you are age 83 or older, the risks that screening can cause are greater than the benefits that it may provide. This information is not intended to replace advice given to you by your health care provider. Make sure you discuss any questions you have with your health care provider. Document Revised: 01/02/2020 Document Reviewed: 04/24/2019 Elsevier Patient Education  2021 [place]. Prostate Cancer  The prostate is a small gland (1.5 inches [3.8 cm] wide and 1 inch [2.5 cm] high) that is involved in the production of semen. It is located below a man's bladder, in front of the rectum. Prostate cancer is the abnormal growth of cells in the prostate gland. What are the causes? The exact cause of this condition is not known. What increases the risk? You are more likely to develop this condition if:  You are 83 years of age or older.  You are African American.  You have a family history of prostate cancer.  You have a family history of breast cancer. What are the signs or symptoms? Symptoms of this condition include:  A need to urinate often.  Weak or interrupted flow of urine.  Trouble starting or stopping  urination.  Inability to urinate.  Blood in urine or semen.  Persistent pain or discomfort in the lower back, lower abdomen, hips, or upper thighs.  Trouble getting an erection.  Trouble emptying the bladder all the way. How is this diagnosed? This condition can be diagnosed with:  A digital rectal exam. For this exam, a health care provider inserts a gloved finger into the rectum to feel the prostate gland.  A blood test called a prostate-specific antigen (PSA) test.  A procedure in which a sample of tissue is taken from the prostate and checked under a microscope (prostate biopsy).  An imaging test called transrectal ultrasonography. Once the condition is diagnosed, tests will be done to determine how far the cancer has spread. This is called staging the cancer. Staging may involve imaging tests, such as:  A bone scan.  A CT scan.  A PET scan.  An MRI. The stages of prostate cancer are as follows:  Stage I. At this stage, the cancer is found in the prostate only.  The cancer is not visible on imaging tests, and it is usually found by accident, such as during prostate surgery.  Stage II. At this stage, the cancer is more advanced than it is in stage I, but the cancer has not spread outside the prostate.  Stage III. At this stage, the cancer has spread beyond the outer layer of the prostate to nearby tissues. The cancer may be found in the seminal vesicles, which are near the bladder and the prostate.  Stage IV. At this stage, the cancer has spread to other parts of the body, such as the lymph nodes, bones, bladder, rectum, liver, or lungs. How is this treated? Treatment for this condition depends on several factors, including the stage of the cancer, your age, personal preferences, and your overall health. Talk with your health care provider about treatment options that are recommended for you. Common treatments include:  Observation for early stage prostate cancer (active  surveillance). This involves having exams, blood tests, and in some cases, more biopsies. For some men, this is the only treatment needed.  Surgery. Types of surgeries include: ? Open surgery (prostatectomy). In this surgery, a larger incision is made to remove the prostate. ? A laparoscopic prostatectomy. This is a surgery to remove the prostate and lymph nodes through several, small incisions. It is often referred to as a minimally invasive surgery. ? A robotic prostatectomy. This is laparoscopic surgery to remove the prostate and lymph nodes with the help of robotic arms that are controlled by the surgeon. ? Orchiectomy. This is surgery to remove the testicles. ? Cryosurgery. This is surgery to freeze and destroy cancer cells.  Radiation treatment. Types of radiation treatment include: ? External beam radiation. This type aims beams of radiation from outside the body at the prostate to destroy cancerous cells. ? Brachytherapy. This type uses radioactive needles, seeds, wires, or tubes that are implanted into the prostate gland. Like external beam radiation, brachytherapy destroys cancerous cells. An advantage is that this type of radiation limits the damage to surrounding tissue and has fewer side effects.  High-intensity, focused ultrasonography. This treatment destroys cancer cells by delivering high-energy ultrasound waves to the cancerous cells.  Chemotherapy medicines. This treatment kills cancer cells or stops them from multiplying. It kills both cancer cells and normal cells.  Targeted therapy. This treatment uses medicines to kill cancer cells without damaging normal cells.  Hormone treatment. This treatment involves taking medicines that act on one of the male hormones (testosterone): ? By stopping your body from producing testosterone. ? By blocking testosterone from reaching cancer cells. Follow these instructions at home:  Take over-the-counter and prescription medicines only  as told by your health care provider.  Maintain a healthy diet.  Get plenty of sleep.  Consider joining a support group for men who have prostate cancer. Meeting with a support group may help you learn to manage the stress of having cancer.  If you have to go to the hospital, notify your cancer specialist (oncologist).  Treatment for prostate cancer may affect sexual function. Continue to have intimate moments with your partner. This may include touching, holding, hugging, and caressing.  Keep all follow-up visits as told by your health care provider. This is important. Contact a health care provider if:  You have new or increasing trouble urinating.  You have new or increasing blood in your urine.  You have new or increasing pain in your hips, back, or chest. Get help right away if:  You have  weakness or numbness in your legs.  You cannot control urination or your bowel movements (incontinence).  You have chills or a fever. Summary  The prostate is a small gland that is involved in the production of semen. It is located below a man's bladder, in front of the rectum.  Prostate cancer is the abnormal growth of cells in the prostate gland.  Treatment for this condition depends on the stage of the cancer, your age, personal preferences, and your overall health. Talk with your health care provider about treatment options that are recommended for you.  Consider joining a support group for men who have prostate cancer. Meeting with a support group may help you learn to cope with the stress of having cancer. This information is not intended to replace advice given to you by your health care provider. Make sure you discuss any questions you have with your health care provider. Document Revised: 08/26/2019 Document Reviewed: 08/26/2019 Elsevier Patient Education  2021 [employer].

## 2021-01-14 ENCOUNTER — Ambulatory Visit: Payer: Medicare HMO | Admitting: Urology

## 2021-01-14 LAB — PSA: Prostate Specific Ag, Serum: 0.6 ng/mL (ref 0.0–4.0)

## 2021-01-14 NOTE — Progress Notes (Signed)
Results sent via my chart 

## 2021-02-28 ENCOUNTER — Ambulatory Visit (INDEPENDENT_AMBULATORY_CARE_PROVIDER_SITE_OTHER): Payer: Medicare HMO | Admitting: Nurse Practitioner

## 2021-02-28 ENCOUNTER — Encounter: Payer: Self-pay | Admitting: Nurse Practitioner

## 2021-02-28 ENCOUNTER — Other Ambulatory Visit: Payer: Self-pay

## 2021-02-28 ENCOUNTER — Telehealth: Payer: Self-pay

## 2021-02-28 DIAGNOSIS — Z0000 Encounter for general adult medical examination without abnormal findings: Secondary | ICD-10-CM

## 2021-02-28 NOTE — Patient Instructions (Signed)
Kevin [name] , Thank you for taking time to come for your Medicare Wellness Visit. I appreciate your ongoing commitment to your health goals. Please review the following plan we discussed and let me know if I can assist you in the future.   Screening recommendations/referrals: Colonoscopy aged out Recommended yearly ophthalmology/optometry visit for glaucoma screening and checkup Recommended yearly dental visit for hygiene and checkup  Vaccinations: Influenza vaccine up to date Pneumococcal vaccine RECOMMENDED- to get Prevnar 13 - can get in office or at pharmacy Tdap vaccine- RECOMMENDED- to get at your local pharmacy Shingles vaccine RECOMMENDED- to get at this time- can get at your local pharmacy   Advanced directives: on file.   Conditions/risks identified: advanced age.   Next appointment: 1 year for AWV  Preventive Care 25 Years and Older, Male Preventive care refers to lifestyle choices and visits with your health care provider that can promote health and wellness. What does preventive care include?  A yearly physical exam. This is also called an annual well check.  Dental exams once or twice a year.  Routine eye exams. Ask your health care provider how often you should have your eyes checked.  Personal lifestyle choices, including:  Daily care of your teeth and gums.  Regular physical activity.  Eating a healthy diet.  Avoiding tobacco and drug use.  Limiting alcohol use.  Practicing safe sex.  Taking low doses of aspirin every day.  Taking vitamin and mineral supplements as recommended by your health care provider. What happens during an annual well check? The services and screenings done by your health care provider during your annual well check will depend on your age, overall health, lifestyle risk factors, and family history of disease. Counseling  Your health care provider may ask you questions about your:  Alcohol use.  Tobacco use.  Drug  use.  Emotional well-being.  Home and relationship well-being.  Sexual activity.  Eating habits.  History of falls.  Memory and ability to understand (cognition).  Work and work Statistician. Screening  You may have the following tests or measurements:  Height, weight, and BMI.  Blood pressure.  Lipid and cholesterol levels. These may be checked every 5 years, or more frequently if you are over 70 years old.  Skin check.  Lung cancer screening. You may have this screening every year starting at age 74 if you have a 30-pack-year history of smoking and currently smoke or have quit within the past 15 years.  Fecal occult blood test (FOBT) of the stool. You may have this test every year starting at age 70.  Flexible sigmoidoscopy or colonoscopy. You may have a sigmoidoscopy every 5 years or a colonoscopy every 10 years starting at age 33.  Prostate cancer screening. Recommendations will vary depending on your family history and other risks.  Hepatitis C blood test.  Hepatitis B blood test.  Sexually transmitted disease (STD) testing.  Diabetes screening. This is done by checking your blood sugar (glucose) after you have not eaten for a while (fasting). You may have this done every 1-3 years.  Abdominal aortic aneurysm (AAA) screening. You may need this if you are a current or former smoker.  Osteoporosis. You may be screened starting at age 66 if you are at high risk. Talk with your health care provider about your test results, treatment options, and if necessary, the need for more tests. Vaccines  Your health care provider may recommend certain vaccines, such as:  Influenza vaccine. This is recommended  every year.  Tetanus, diphtheria, and acellular pertussis (Tdap, Td) vaccine. You may need a Td booster every 10 years.  Zoster vaccine. You may need this after age 21.  Pneumococcal 13-valent conjugate (PCV13) vaccine. One dose is recommended after age  23.  Pneumococcal polysaccharide (PPSV23) vaccine. One dose is recommended after age 30. Talk to your health care provider about which screenings and vaccines you need and how often you need them. This information is not intended to replace advice given to you by your health care provider. Make sure you discuss any questions you have with your health care provider. Document Released: 10/08/2015 Document Revised: 05/31/2016 Document Reviewed: 07/13/2015 Elsevier Interactive Patient Education  2017 [place] Prevention in the Home Falls can cause injuries. They can happen to people of all ages. There are many things you can do to make your home safe and to help prevent falls. What can I do on the outside of my home?  Regularly fix the edges of walkways and driveways and fix any cracks.  Remove anything that might make you trip as you walk through a door, such as a raised step or threshold.  Trim any bushes or trees on the path to your home.  Use bright outdoor lighting.  Clear any walking paths of anything that might make someone trip, such as rocks or tools.  Regularly check to see if handrails are loose or broken. Make sure that both sides of any steps have handrails.  Any raised decks and porches should have guardrails on the edges.  Have any leaves, snow, or ice cleared regularly.  Use sand or salt on walking paths during winter.  Clean up any spills in your garage right away. This includes oil or grease spills. What can I do in the bathroom?  Use night lights.  Install grab bars by the toilet and in the tub and shower. Do not use towel bars as grab bars.  Use non-skid mats or decals in the tub or shower.  If you need to sit down in the shower, use a plastic, non-slip stool.  Keep the floor dry. Clean up any water that spills on the floor as soon as it happens.  Remove soap buildup in the tub or shower regularly.  Attach bath mats securely with double-sided  non-slip rug tape.  Do not have throw rugs and other things on the floor that can make you trip. What can I do in the bedroom?  Use night lights.  Make sure that you have a light by your bed that is easy to reach.  Do not use any sheets or blankets that are too big for your bed. They should not hang down onto the floor.  Have a firm chair that has side arms. You can use this for support while you get dressed.  Do not have throw rugs and other things on the floor that can make you trip. What can I do in the kitchen?  Clean up any spills right away.  Avoid walking on wet floors.  Keep items that you use a lot in easy-to-reach places.  If you need to reach something above you, use a strong step stool that has a grab bar.  Keep electrical cords out of the way.  Do not use floor polish or wax that makes floors slippery. If you must use wax, use non-skid floor wax.  Do not have throw rugs and other things on the floor that can make you trip. What  can I do with my stairs?  Do not leave any items on the stairs.  Make sure that there are handrails on both sides of the stairs and use them. Fix handrails that are broken or loose. Make sure that handrails are as long as the stairways.  Check any carpeting to make sure that it is firmly attached to the stairs. Fix any carpet that is loose or worn.  Avoid having throw rugs at the top or bottom of the stairs. If you do have throw rugs, attach them to the floor with carpet tape.  Make sure that you have a light switch at the top of the stairs and the bottom of the stairs. If you do not have them, ask someone to add them for you. What else can I do to help prevent falls?  Wear shoes that:  Do not have high heels.  Have rubber bottoms.  Are comfortable and fit you well.  Are closed at the toe. Do not wear sandals.  If you use a stepladder:  Make sure that it is fully opened. Do not climb a closed stepladder.  Make sure that both  sides of the stepladder are locked into place.  Ask someone to hold it for you, if possible.  Clearly mark and make sure that you can see:  Any grab bars or handrails.  First and last steps.  Where the edge of each step is.  Use tools that help you move around (mobility aids) if they are needed. These include:  Canes.  Walkers.  Scooters.  Crutches.  Turn on the lights when you go into a dark area. Replace any light bulbs as soon as they burn out.  Set up your furniture so you have a clear path. Avoid moving your furniture around.  If any of your floors are uneven, fix them.  If there are any pets around you, be aware of where they are.  Review your medicines with your doctor. Some medicines can make you feel dizzy. This can increase your chance of falling. Ask your doctor what other things that you can do to help prevent falls. This information is not intended to replace advice given to you by your health care provider. Make sure you discuss any questions you have with your health care provider. Document Released: 07/08/2009 Document Revised: 02/17/2016 Document Reviewed: 10/16/2014 Elsevier Interactive Patient Education  2017 [employer].

## 2021-02-28 NOTE — Progress Notes (Signed)
   This service is provided via telemedicine  No vital signs collected/recorded due to the encounter was a telemedicine visit.   Location of patient (ex: home, work):  Home  Patient consents to a telephone visit: Yes, see telephone visit dated 02/28/21   Location of the provider (ex: office, home):  [facility], Office   Name of any referring provider:  N/A  Names of all persons participating in the telemedicine service and their role in the encounter:  S.Chrae B/CMA, Sherrie Mustache, NP, and Patient   Time spent on call:  19 min with medical assistant

## 2021-02-28 NOTE — Telephone Encounter (Signed)
Mr. lukas, pelcher are scheduled for a virtual visit with your provider today.    Just as we do with appointments in the office, we must obtain your consent to participate.  Your consent will be active for this visit and any virtual visit you may have with one of our providers in the next 365 days.    If you have a MyChart account, I can also send a copy of this consent to you electronically.  All virtual visits are billed to your insurance company just like a traditional visit in the office.  As this is a virtual visit, video technology does not allow for your provider to perform a traditional examination.  This may limit your provider's ability to fully assess your condition.  If your provider identifies any concerns that need to be evaluated in person or the need to arrange testing such as labs, EKG, etc, we will make arrangements to do so.    Although advances in technology are sophisticated, we cannot ensure that it will always work on either your end or our end.  If the connection with a video visit is poor, we may have to switch to a telephone visit.  With either a video or telephone visit, we are not always able to ensure that we have a secure connection.   I need to obtain your verbal consent now.   Are you willing to proceed with your visit today?   Reshawn Ostlund Baston has provided verbal consent on 02/28/2021 for a virtual visit (video or telephone).   Leigh Aurora [place], [place] 02/28/2021  10:01 AM

## 2021-02-28 NOTE — Progress Notes (Signed)
Subjective:   Kevin [name] is a 85 y.o. male who presents for Medicare Annual/Subsequent preventive examination.  Review of Systems     Cardiac Risk Factors include: advanced age (>46men, >81 women);male gender;sedentary lifestyle     Objective:    There were no vitals filed for this visit. There is no height or weight on file to calculate BMI.  Advanced Directives 02/28/2021 10/25/2020 10/20/2020 02/25/2020 02/13/2020 03/06/2016 01/20/2016  Does Patient Have a Medical Advance Directive? Yes Yes Yes Yes Yes No Yes  Type of Advance Directive Living will Living will Living will Living will Living will;Healthcare Power of Attorney;Out of facility DNR (pink MOST or yellow form) - [place]  Does patient want to make changes to medical advance directive? No - Patient declined No - Patient declined No - Patient declined No - Patient declined No - Patient declined - No - Patient declined  Copy of [place] in Chart? - - - No - copy requested No - copy requested - No - copy requested  Would patient like information on creating a medical advance directive? - - - - - No - patient declined information -    Current Medications (verified) Outpatient Encounter Medications as of 02/28/2021  Medication Sig  . acetaminophen (TYLENOL) 325 MG tablet Take 650 mg by mouth every 6 (six) hours as needed.  . calcium carbonate (TUMS EX) 750 MG chewable tablet Chew 1 tablet by mouth as needed for heartburn.  . diclofenac Sodium (VOLTAREN) 1 % GEL Apply 4 g topically 2 (two) times daily as needed. Apply to right knee and ankle  . Homeopathic Products (YEAST-GARD HOMEOPATHIC) GEL 1 application daily. Apply to genitals  . MAGNESIUM PO Take 2 Doses by mouth at bedtime. 250 mg chewable total of 500 mg  . melatonin 3 MG TABS tablet Take 3 mg by mouth at bedtime as needed.  . Menthol, Topical Analgesic, (BIOFREEZE) 4 % GEL Apply topically daily as needed.  . naproxen sodium (ALEVE)  220 MG tablet Take 220 mg by mouth as needed (Headaches).  . [DISCONTINUED] calamine lotion Apply 1 application topically 3 (three) times daily. Affected areas on thigh  . [DISCONTINUED] Magnesium 500 MG CAPS Take 1 capsule by mouth daily. (Patient not taking: Reported on 02/28/2021)  . [DISCONTINUED] valACYclovir (VALTREX) 1000 MG tablet Take 1 tablet (1,000 mg total) by mouth 2 (two) times daily.   No facility-administered encounter medications on file as of 02/28/2021.    Allergies (verified) Patient has no known allergies.   History: Past Medical History:  Diagnosis Date  . Anxiety disorder, unspecified   . Arthritis   . Cancer [facility])    prostate  . Cataract   . Edema   . Erectile dysfunction   . Glaucoma   . History of blood in urine   . History of cataract surgery 09/26/2015   Both Eyes by Dr.Hunt.  . History of colonoscopy 09/26/2007   Dr.LeBaur  . Indigestion   . Knee pain   . Pain in unspecified hip   . Prostate cancer ([place])   . Urinary incontinence    Past Surgical History:  Procedure Laterality Date  . CATARACT EXTRACTION W/PHACO Right 01/20/2016   Procedure: CATARACT EXTRACTION PHACO AND INTRAOCULAR LENS PLACEMENT (IOC);  Surgeon: Tonny Branch, MD;  Location: [facility];  Service: Ophthalmology;  Laterality: Right;  CDE 15.42  . CATARACT EXTRACTION W/PHACO Left 03/06/2016   Procedure: CATARACT EXTRACTION PHACO AND INTRAOCULAR LENS PLACEMENT LEFT EYE;  CDE:  15.81;  Surgeon: Tonny Branch, MD;  Location: [facility];  Service: Ophthalmology;  Laterality: Left;  . CHOLECYSTECTOMY  2011  . COLONOSCOPY  07/03/2008  . FRACTURE SURGERY  09/25/1966   [facility].   . GALLBLADDER SURGERY  09/26/2007   Dr.Jenkins   . HERNIA REPAIR Right 09/26/1991   Dr.Smith   . PROSTATE SURGERY     cancer  . SUPRAPUBIC PROSTATECTOMY  09/26/1995   Dr.Wrenn  . VASECTOMY  09/25/1972   [facility].    Family History  Problem Relation Age of Onset  . Healthy Mother   . Hypertension  Mother   . Dementia Mother   . Heart disease Father   . Dementia Father   . Heart failure Father   . Heart disease Brother   . Stroke Brother   . Hypertension Son   . Stroke Brother    Social History   Socioeconomic History  . Marital status: Married    Spouse name: Not on file  . Number of children: 3  . Years of education: Not on file  . Highest education level: Not on file  Occupational History  . Occupation: retired  Tobacco Use  . Smoking status: Former Smoker    Packs/day: 0.50    Years: 16.00    Pack years: 8.00    Types: Cigarettes, Cigars    Quit date: 01/18/2004    Years since quitting: 17.1  . Smokeless tobacco: Never Used  Vaping Use  . Vaping Use: Never used  Substance and Sexual Activity  . Alcohol use: Yes    Alcohol/week: 1.0 standard drink    Types: 1 Cans of beer per week    Comment: 1 beer week  . Drug use: No  . Sexual activity: Not Currently    Birth control/protection: None  Other Topics Concern  . Not on file  Social History Narrative   Tobacco use, amount per day now: Non smoker for 16 years.   Past tobacco use, amount per day: 1/2 Pack of cigarettes a day or 3 cigars.    How many years did you use tobacco: 15 years.   Alcohol use (drinks per week): 0-2 drinks.   Diet: Normal.    Do you drink/eat things with caffeine: Yes.   Marital status:  Married                                What year were you married? 1981   Do you live in a house, apartment, assisted living, condo, trailer, etc.? House.   Is it one or more stories? 2 stories.    How many persons live in your home? 2   Do you have pets in your home?( please list)  Yes 2 dogs, 1 cat.   Highest Level of education completed: High School GED.   Current or past profession: Hydrologist.    Do you exercise? Not regularly.                                    Type and how often? Working outside on yard.    Do you have a living will? Yes.   Do you have a DNR form?   Yes.  If not, do you want to discuss one?   Do you have signed POA/HPOA forms? Yes.                       If so, please bring to you appointment    Do you have any difficulty bathing or dressing yourself? No.    Do you have difficulty preparing food or eating? No.   Do you have difficulty managing your medications? No.   Do you have any difficulty managing your finances? No.    Do you have any difficulty affording your medications? No.       Social Determinants of Health   Financial Resource Strain: Not on file  Food Insecurity: Not on file  Transportation Needs: Not on file  Physical Activity: Not on file  Stress: Not on file  Social Connections: Not on file    Tobacco Counseling Counseling given: Not Answered   Clinical Intake:  Pre-visit preparation completed: Yes  Pain : No/denies pain     BMI - recorded: 30 Nutritional Status: BMI > 30  Obese Nutritional Risks: None Diabetes: No  How often do you need to have someone help you when you read instructions, pamphlets, or other written materials from your doctor or pharmacy?: 4 - Often  Diabetic? no        Activities of Daily Living In your present state of health, do you have any difficulty performing the following activities: 02/28/2021  Hearing? Y  Vision? N  Difficulty concentrating or making decisions? Y  Walking or climbing stairs? Y  Comment pain on right leg  Dressing or bathing? Y  Comment pain on right leg  Doing errands, shopping? N  Preparing Food and eating ? Y  Comment wife prepares food  Using the Toilet? N  In the past six months, have you accidently leaked urine? Y  Do you have problems with loss of bowel control? N  Managing your Medications? N  Managing your Finances? N  Housekeeping or managing your Housekeeping? Y  Some recent data might be hidden    Patient Care Team: Lauree Chandler, NP as PCP - General (Geriatric Medicine) [name], MD as  Consulting Physician (Cardiology) [name], MD as Attending Physician (Urology)  Indicate any recent Medical Services you may have received from other than [facility] providers in the past year (date may be approximate).     Assessment:   This is a routine wellness examination for Abran.  Hearing/Vision screen  Hearing Screening   125Hz  250Hz  500Hz  1000Hz  2000Hz  3000Hz  4000Hz  6000Hz  8000Hz   Right ear:           Left ear:           Comments: No hearing aids, patient with hearing loss (he thinks)   Vision Screening Comments: Last eye exam greater than 12 months ago, patient goes to [facility] in [place].   Dietary issues and exercise activities discussed:    Goals Addressed            This Visit's Progress   . Increase physical activity        Depression Screen PHQ 2/9 Scores 02/28/2021 02/25/2020 02/13/2020  PHQ - 2 Score 4 0 0  PHQ- 9 Score 16 - -    Fall Risk Fall Risk  02/28/2021 10/25/2020 10/20/2020 04/14/2020 02/25/2020  Falls in the past year? 0 0 0 0 0  Number falls in past yr: 0 0 0 0 0  Injury  with Fall? 0 0 - 0 0    FALL RISK PREVENTION PERTAINING TO THE HOME:  Any stairs in or around the home? Yes  If so, are there any without handrails? No  Home free of loose throw rugs in walkways, pet beds, electrical cords, etc? Yes  Adequate lighting in your home to reduce risk of falls? Yes   ASSISTIVE DEVICES UTILIZED TO PREVENT FALLS:  Life alert? No  Use of a cane, walker or w/c? No  Grab bars in the bathroom? Yes  Shower chair or bench in shower? No  Elevated toilet seat or a handicapped toilet? No   TIMED UP AND GO:  Was the test performed? No .   Cognitive Function:     6CIT Screen 02/28/2021 02/25/2020  What Year? 0 points 0 points  What month? 0 points 0 points  What time? 0 points 0 points  Count back from 20 0 points 0 points  Months in reverse 0 points 0 points  Repeat phrase 8 points 0 points  Total Score 8 0    Immunizations Immunization  History  Administered Date(s) Administered  . Influenza Split 07/06/2016, 07/03/2017, 05/27/2019  . Influenza, High Dose Seasonal PF 07/20/2020  . Influenza-Unspecified 09/25/2018  . Moderna SARS-COV2 Booster Vaccination 07/22/2020  . Moderna Sars-Covid-2 Vaccination 10/31/2019, 11/29/2019  . Pneumococcal Polysaccharide-23 01/02/2017    TDAP status: Due, Education has been provided regarding the importance of this vaccine. Advised may receive this vaccine at local pharmacy or Health Dept. Aware to provide a copy of the vaccination record if obtained from local pharmacy or Health Dept. Verbalized acceptance and understanding.  Flu Vaccine status: Up to date  Pneumococcal vaccine status: Due, Education has been provided regarding the importance of this vaccine. Advised may receive this vaccine at local pharmacy or Health Dept. Aware to provide a copy of the vaccination record if obtained from local pharmacy or Health Dept. Verbalized acceptance and understanding.  Covid-19 vaccine status: Completed vaccines  Qualifies for Shingles Vaccine? Yes   Zostavax completed No   Shingrix Completed?: No.    Education has been provided regarding the importance of this vaccine. Patient has been advised to call insurance company to determine out of pocket expense if they have not yet received this vaccine. Advised may also receive vaccine at local pharmacy or Health Dept. Verbalized acceptance and understanding.  Screening Tests Health Maintenance  Topic Date Due  . Pneumococcal Vaccine 20-67 Years old (1 of 4 - PCV13) Never done  . TETANUS/TDAP  Never done  . Zoster Vaccines- Shingrix (1 of 2) Never done  . PNA vac Low Risk Adult (2 of 2 - PCV13) 01/02/2018  . COVID-19 Vaccine (4 - Booster for Moderna series) 10/22/2020  . INFLUENZA VACCINE  04/25/2021  . HPV VACCINES  Aged Out    Health Maintenance  Health Maintenance Due  Topic Date Due  . Pneumococcal Vaccine 77-44 Years old (1 of 4 - PCV13)  Never done  . TETANUS/TDAP  Never done  . Zoster Vaccines- Shingrix (1 of 2) Never done  . PNA vac Low Risk Adult (2 of 2 - PCV13) 01/02/2018  . COVID-19 Vaccine (4 - Booster for Moderna series) 10/22/2020    Colorectal cancer screening: No longer required.   Lung Cancer Screening: (Low Dose CT Chest recommended if Age 75-80 years, 30 pack-year currently smoking OR have quit w/in 15years.) does not qualify.   Lung Cancer Screening Referral: na  Additional Screening:  Hepatitis C Screening: does not  qualify  Vision Screening: Recommended annual ophthalmology exams for early detection of glaucoma and other disorders of the eye. Is the patient up to date with their annual eye exam?  No  Who is the provider or what is the name of the office in which the patient ends annual eye exams? [facility] If pt is not established with a provider, would they like to be referred to a provider to establish care? No .   Dental Screening: Recommended annual dental exams for proper oral hygiene  Community Resource Referral / Chronic Care Management: CRR required this visit?  No   CCM required this visit?  No      Plan:     I have personally reviewed and noted the following in the patient's chart:   . Medical and social history . Use of alcohol, tobacco or illicit drugs  . Current medications and supplements including opioid prescriptions. Patient is not currently taking opioid prescriptions. . Functional ability and status . Nutritional status . Physical activity . Advanced directives . List of other physicians . Hospitalizations, surgeries, and ER visits in previous 12 months . Vitals . Screenings to include cognitive, depression, and falls . Referrals and appointments  In addition, I have reviewed and discussed with patient certain preventive protocols, quality metrics, and best practice recommendations. A written personalized care plan for preventive services as well as general  preventive health recommendations were provided to patient.     Lauree Chandler, NP   02/28/2021   Virtual Visit via Telephone Note  I connected with@ on 02/28/21 at 10:00 AM EDT by telephone and verified that I am speaking with the correct person using two identifiers.  Location: Patient: home Provider: [facility]   I discussed the limitations, risks, security and privacy concerns of performing an evaluation and management service by telephone and the availability of in person appointments. I also discussed with the patient that there may be a patient responsible charge related to this service. The patient expressed understanding and agreed to proceed.   I discussed the assessment and treatment plan with the patient. The patient was provided an opportunity to ask questions and all were answered. The patient agreed with the plan and demonstrated an understanding of the instructions.   The patient was advised to call back or seek an in-person evaluation if the symptoms worsen or if the condition fails to improve as anticipated.  I provided 15 minutes of non-face-to-face time during this encounter.  Carlos American. Harle Battiest [employer]s printed and mailed

## 2021-03-18 ENCOUNTER — Ambulatory Visit (INDEPENDENT_AMBULATORY_CARE_PROVIDER_SITE_OTHER): Payer: Medicare HMO | Admitting: Nurse Practitioner

## 2021-03-18 ENCOUNTER — Encounter: Payer: Self-pay | Admitting: Nurse Practitioner

## 2021-03-18 ENCOUNTER — Other Ambulatory Visit: Payer: Self-pay

## 2021-03-18 VITALS — BP 138/80 | HR 52 | Temp 97.3°F | Ht 68.0 in | Wt 181.0 lb

## 2021-03-18 DIAGNOSIS — E785 Hyperlipidemia, unspecified: Secondary | ICD-10-CM

## 2021-03-18 DIAGNOSIS — M159 Polyosteoarthritis, unspecified: Secondary | ICD-10-CM | POA: Diagnosis not present

## 2021-03-18 DIAGNOSIS — Z8546 Personal history of malignant neoplasm of prostate: Secondary | ICD-10-CM

## 2021-03-18 DIAGNOSIS — H9312 Tinnitus, left ear: Secondary | ICD-10-CM | POA: Diagnosis not present

## 2021-03-18 DIAGNOSIS — N1831 Chronic kidney disease, stage 3a: Secondary | ICD-10-CM | POA: Diagnosis not present

## 2021-03-18 DIAGNOSIS — H9192 Unspecified hearing loss, left ear: Secondary | ICD-10-CM | POA: Diagnosis not present

## 2021-03-18 DIAGNOSIS — Z9109 Other allergy status, other than to drugs and biological substances: Secondary | ICD-10-CM | POA: Diagnosis not present

## 2021-03-18 DIAGNOSIS — R1310 Dysphagia, unspecified: Secondary | ICD-10-CM

## 2021-03-18 LAB — COMPLETE METABOLIC PANEL WITH GFR
AG Ratio: 1.8 (calc) (ref 1.0–2.5)
ALT: 18 U/L (ref 9–46)
AST: 20 U/L (ref 10–35)
Albumin: 4.2 g/dL (ref 3.6–5.1)
Alkaline phosphatase (APISO): 69 U/L (ref 35–144)
BUN/Creatinine Ratio: 13 (calc) (ref 6–22)
BUN: 15 mg/dL (ref 7–25)
CO2: 27 mmol/L (ref 20–32)
Calcium: 9 mg/dL (ref 8.6–10.3)
Chloride: 105 mmol/L (ref 98–110)
Creat: 1.17 mg/dL — ABNORMAL HIGH (ref 0.70–1.11)
GFR, Est African American: 67 mL/min/{1.73_m2} (ref >=60)
GFR, Est Non African American: 58 mL/min/{1.73_m2} — ABNORMAL LOW (ref >=60)
Globulin: 2.3 g/dL (calc) (ref 1.9–3.7)
Glucose, Bld: 98 mg/dL (ref 65–139)
Potassium: 4.4 mmol/L (ref 3.5–5.3)
Sodium: 140 mmol/L (ref 135–146)
Total Bilirubin: 0.7 mg/dL (ref 0.2–1.2)
Total Protein: 6.5 g/dL (ref 6.1–8.1)

## 2021-03-18 LAB — CBC WITH DIFFERENTIAL/PLATELET
Absolute Monocytes: 536 cells/uL (ref 200–950)
Basophils Absolute: 38 cells/uL (ref 0–200)
Basophils Relative: 0.6 %
Eosinophils Absolute: 63 cells/uL (ref 15–500)
Eosinophils Relative: 1 %
HCT: 45.4 % (ref 38.5–50.0)
Hemoglobin: 15.2 g/dL (ref 13.2–17.1)
Lymphs Abs: 1890 cells/uL (ref 850–3900)
MCH: 30.2 pg (ref 27.0–33.0)
MCHC: 33.5 g/dL (ref 32.0–36.0)
MCV: 90.1 fL (ref 80.0–100.0)
MPV: 9.9 fL (ref 7.5–12.5)
Monocytes Relative: 8.5 %
Neutro Abs: 3774 cells/uL (ref 1500–7800)
Neutrophils Relative %: 59.9 %
Platelets: 175 10*3/uL (ref 140–400)
RBC: 5.04 10*6/uL (ref 4.20–5.80)
RDW: 12.5 % (ref 11.0–15.0)
Total Lymphocyte: 30 %
WBC: 6.3 10*3/uL (ref 3.8–10.8)

## 2021-03-18 LAB — LIPID PANEL
Cholesterol: 189 mg/dL (ref <200)
HDL: 53 mg/dL (ref >=40)
LDL Cholesterol (Calc): 114 mg/dL (calc) — ABNORMAL HIGH
Non-HDL Cholesterol (Calc): 136 mg/dL (calc) — ABNORMAL HIGH (ref <130)
Total CHOL/HDL Ratio: 3.6 (calc) (ref <5.0)
Triglycerides: 113 mg/dL (ref <150)

## 2021-03-18 NOTE — Progress Notes (Signed)
  Careteam: Patient Care Team: ,  K, NP as PCP - General (Geriatric Medicine) Nishan, Peter C, MD as Consulting Physician (Cardiology) Wrenn, John, MD as Attending Physician (Urology)  PLACE OF SERVICE:  [facility]  Advanced Directive information Does Patient Have a Medical Advance Directive?: Yes, Type of Advance Directive: Living will, Does patient want to make changes to medical advance directive?: No - Patient declined  No Known Allergies  Chief Complaint  Patient presents with   Medical Management of Chronic Issues    5-6 month follow-up. Discuss need for td/tdap, shingrix, PNA, and covid vaccines or exclude (not in [facility]). Patient c/o ringing in left ear.      HPI: Patient is a 85 y.o. Kevin [name] for routine follow up.   Continues to follow up with urology due to hx of prostate cancer. Reports both his boys have abnormal prostate readings and one with prostate cancer going through radiation.   OA of right knee and ankle- getting worse. He has been taking tylenol  Has had ringing in his ear for years- used to be on and off now it is continuous - he is not able to hear anyone in left ear due to ringing.   No changes in headache.  Sleeping well. Reports mood 'ok" "nothing to worry about"    Review of Systems:  Review of Systems  Constitutional:  Negative for chills, fever and weight loss.  HENT:  Negative for tinnitus.   Respiratory:  Negative for cough, sputum production and shortness of breath.   Cardiovascular:  Negative for chest pain, palpitations and leg swelling.  Gastrointestinal:  Negative for abdominal pain, constipation, diarrhea and heartburn.  Genitourinary:  Negative for dysuria, frequency and urgency.  Musculoskeletal:  Positive for joint pain. Negative for back pain, falls and myalgias.  Skin: Negative.   Neurological:  Negative for dizziness and headaches.  Psychiatric/Behavioral:  Negative for depression and memory loss. The patient does not  have insomnia.    Past Medical History:  Diagnosis Date   Anxiety disorder, unspecified    Arthritis    Cancer ([facility])    prostate   Cataract    Edema    Erectile dysfunction    Glaucoma    History of blood in urine    History of cataract surgery 09/26/2015   Both Eyes by Dr.Hunt.   History of colonoscopy 09/26/2007   Dr.LeBaur   Indigestion    Knee pain    Pain in unspecified hip    Prostate cancer ([facility])    Urinary incontinence    Past Surgical History:  Procedure Laterality Date   CATARACT EXTRACTION W/PHACO Right 01/20/2016   Procedure: CATARACT EXTRACTION PHACO AND INTRAOCULAR LENS PLACEMENT (IOC);  Surgeon: Kerry Hunt, MD;  Location: [facility];  Service: Ophthalmology;  Laterality: Right;  CDE 15.42   CATARACT EXTRACTION W/PHACO Left 03/06/2016   Procedure: CATARACT EXTRACTION PHACO AND INTRAOCULAR LENS PLACEMENT LEFT EYE;  CDE:  15.81;  Surgeon: Kerry Hunt, MD;  Location: [facility];  Service: Ophthalmology;  Laterality: Left;   CHOLECYSTECTOMY  2011   COLONOSCOPY  07/03/2008   FRACTURE SURGERY  09/25/1966   [facility].    GALLBLADDER SURGERY  09/26/2007   Dr.Jenkins    HERNIA REPAIR Right 09/26/1991   Dr.Smith    PROSTATE SURGERY     cancer   SUPRAPUBIC PROSTATECTOMY  09/26/1995   Dr.Wrenn   VASECTOMY  09/25/1972   [facility].    Social History:   reports that   he quit smoking about 17 years ago. His smoking use included cigarettes and cigars. He has a 8.00 pack-year smoking history. He has never used smokeless tobacco. He reports current alcohol use of about 1.0 standard drink of alcohol per week. He reports that he does not use drugs.  Family History  Problem Relation Age of Onset   Healthy Mother    Hypertension Mother    Dementia Mother    Heart disease Father    Dementia Father    Heart failure Father    Heart disease Brother    Stroke Brother    Hypertension Son    Stroke Brother     Medications: Patient's Medications  New  Prescriptions   No medications on file  Previous Medications   ACETAMINOPHEN (TYLENOL) 325 MG TABLET    Take 650 mg by mouth every 6 (six) hours as needed.   CALCIUM CARBONATE (TUMS EX) 750 MG CHEWABLE TABLET    Chew 1 tablet by mouth as needed for heartburn.   DICLOFENAC SODIUM (VOLTAREN) 1 % GEL    Apply 4 g topically 2 (two) times daily as needed. Apply to right knee and ankle   HOMEOPATHIC PRODUCTS (YEAST-GARD HOMEOPATHIC) GEL    1 application daily. Apply to genitals   MAGNESIUM PO    Take 2 Doses by mouth at bedtime. 250 mg chewable total of 500 mg   MELATONIN 3 MG TABS TABLET    Take 3 mg by mouth at bedtime as needed.   MENTHOL, TOPICAL ANALGESIC, (BIOFREEZE) 4 % GEL    Apply topically daily as needed.   NAPROXEN SODIUM (ALEVE) 220 MG TABLET    Take 220 mg by mouth as needed (Headaches).  Modified Medications   No medications on file  Discontinued Medications   No medications on file    Physical Exam:  Vitals:   03/18/21 1122  BP: 138/80  Pulse: (!) 52  Temp: (!) 97.3 F (36.3 C)  TempSrc: Temporal  SpO2: 95%  Weight: 181 lb (Kevin.1 kg)  Height: 5' 8" (1.727 m)   Body mass index is 27.52 kg/m. Wt Readings from Last 3 Encounters:  03/18/21 181 lb (Kevin.1 kg)  01/13/21 180 lb (81.6 kg)  10/25/20 175 lb 3.2 oz (79.5 kg)    Physical Exam Constitutional:      General: He is not in acute distress.    Appearance: He is well-developed. He is not diaphoretic.  HENT:     Head: Normocephalic and atraumatic.     Right Ear: External ear normal.     Left Ear: External ear normal.     Mouth/Throat:     Pharynx: No oropharyngeal exudate.  Eyes:     Conjunctiva/sclera: Conjunctivae normal.     Pupils: Pupils are equal, round, and reactive to light.  Cardiovascular:     Rate and Rhythm: Normal rate and regular rhythm.     Heart sounds: Normal heart sounds.  Pulmonary:     Effort: Pulmonary effort is normal.     Breath sounds: Normal breath sounds.  Abdominal:     General:  Bowel sounds are normal.     Palpations: Abdomen is soft.  Musculoskeletal:        General: No tenderness.     Cervical back: Normal range of motion and neck supple.     Right lower leg: No edema.     Left lower leg: No edema.  Skin:    General: Skin is warm and dry.  Neurological:       Mental Status: He is alert and oriented to person, place, and time.    Labs reviewed: Basic Metabolic Panel: Recent Labs    04/08/20 0856  NA 142  K 4.4  CL 105  CO2 28  GLUCOSE 99  BUN 14  CREATININE 1.34*  CALCIUM 9.3   Liver Function Tests: Recent Labs    04/08/20 0856  AST 20  ALT 16  BILITOT 0.9  PROT 6.5   No results for input(s): LIPASE, AMYLASE in the last 8760 hours. No results for input(s): AMMONIA in the last 8760 hours. CBC: Recent Labs    04/08/20 0856  WBC 5.1  NEUTROABS 2,749  HGB 14.9  HCT 42.5  MCV 89.1  PLT 184   Lipid Panel: Recent Labs    04/08/20 0856  CHOL 177  HDL 51  LDLCALC 111*  TRIG 66  CHOLHDL 3.5   TSH: No results for input(s): TSH in the last 8760 hours. A1C: No results found for: HGBA1C   Assessment/Plan 1. Osteoarthritis of multiple joints, unspecified osteoarthritis type -worsening pain to right knee and ankle.  - Ambulatory referral to Orthopedic Surgery at this time.  - CMP with eGFR([facility]) - CBC with Differential/Platelet  2. Tinnitus of left ear Progressive ringing in ear with hearing loss.  - Ambulatory referral to ENT  3. Hearing loss of left ear, unspecified hearing loss type - Ambulatory referral to ENT  4. Dysphagia, unspecified type -ongoing/stable. feeling of food getting stuck, encouraged GI referral but declines at this time.  5. Hyperlipidemia, unspecified hyperlipidemia type -continues dietary modifications. - Lipid panel - CMP with eGFR([facility])  6. Stage 3a chronic kidney disease (HCC) -Encourage proper hydration and to avoid NSAIDS (Aleve, Advil, Motrin, Ibuprofen)  - CMP with eGFR([facility])  7.  Environmental allergies Ongoing, to start claritin 10 mg by mouth daily.  8. History of prostate cancer Stable, followed by urology/   Next appt: 6 months  K. , AGNP  [facility] [phone]   

## 2021-03-18 NOTE — Patient Instructions (Addendum)
To start Claritin 10 mg by mouth daily   At your pharmacy to get COVID booster, Shingrix, and TdaP

## 2021-03-23 ENCOUNTER — Encounter: Payer: Self-pay | Admitting: Orthopaedic Surgery

## 2021-04-21 ENCOUNTER — Ambulatory Visit: Payer: Medicare HMO | Admitting: Orthopedic Surgery

## 2021-04-21 ENCOUNTER — Ambulatory Visit: Payer: Medicare HMO

## 2021-04-21 ENCOUNTER — Encounter: Payer: Self-pay | Admitting: Orthopedic Surgery

## 2021-04-21 ENCOUNTER — Other Ambulatory Visit: Payer: Self-pay

## 2021-04-21 VITALS — BP 140/78 | HR 56 | Ht 67.0 in | Wt 177.0 lb

## 2021-04-21 DIAGNOSIS — M19071 Primary osteoarthritis, right ankle and foot: Secondary | ICD-10-CM | POA: Diagnosis not present

## 2021-04-21 DIAGNOSIS — M1711 Unilateral primary osteoarthritis, right knee: Secondary | ICD-10-CM | POA: Diagnosis not present

## 2021-04-21 DIAGNOSIS — G8929 Other chronic pain: Secondary | ICD-10-CM

## 2021-04-21 DIAGNOSIS — M19079 Primary osteoarthritis, unspecified ankle and foot: Secondary | ICD-10-CM

## 2021-04-21 DIAGNOSIS — M25571 Pain in right ankle and joints of right foot: Secondary | ICD-10-CM

## 2021-04-21 DIAGNOSIS — M1710 Unilateral primary osteoarthritis, unspecified knee: Secondary | ICD-10-CM

## 2021-04-21 MED ORDER — MELOXICAM 7.5 MG PO TABS: 7.5000 mg | ORAL_TABLET | Freq: Every day | ORAL | 5 refills | Status: DC

## 2021-04-21 NOTE — Progress Notes (Signed)
NEW PROBLEM//OFFICE VISIT  Summary assessment and plan:   Encounter Diagnoses  Name Primary?   Pain in right ankle and joints of right foot Yes   Chronic pain of right knee     Recommend injection right knee and start arthritis medication  A steroid injection was performed at right knee lateral inferior using 1% plain Lidocaine and 6 mg of Celestone. This was well tolerated.    Inject subtalar joint right foot  A steroid injection was performed at right knee using 1% plain Lidocaine and Celestone 6 mg mg of Celestone. This was well tolerated.     Chief Complaint  Patient presents with   Ankle Pain    Right /painful to weight bear painful worse in morning    Knee Pain    Right    85 year old male status post distal tib-fib fracture many years ago and did not have any issues but comes in today complaining of right ankle pain difficulty getting up in the morning when he puts his foot down its worse over the last 2 years but started about 10 years ago.  He says the foot always seems to want to give out return in  He also has 10-year history of right knee pain with pain at night where he can touch to 2 legs together this is associated with intermittent swelling at times it is hard to bend his knee  Occasionally he will use a cane.  He perceives the ankle as the primary cause of his walking difficulties  Over-the-counter medication such as Voltaren gel and Biofreeze were used for the knee    MEDICAL DECISION MAKING  A.  Encounter Diagnoses  Name Primary?   Pain in right ankle and joints of right foot Yes   Chronic pain of right knee     B. DATA ANALYSED:   IMAGING: Interpretation of images: Internal images the first images of the knee there is a varus alignment to the right knee with grade 4 changes in the medial compartment  The ankle is normal however the distal tibia had a fracture years ago and it healed in varus and posterior angulation no ankle joint  arthritis  Orders: None  Outside records reviewed: None   C. MANAGEMENT   As above  No orders of the defined types were placed in this encounter.    BP 140/78   Pulse (!) 56   Ht '5\' 7"'$  (1.702 m)   Wt 177 lb (80.3 kg)   BMI 27.72 kg/m    General appearance: Well-developed well-nourished no gross deformities  Cardiovascular normal pulse and perfusion normal color without edema  Neurologically no sensation loss or deficits or pathologic reflexes  Psychological: Awake alert and oriented x3 mood and affect normal  Skin no lacerations or ulcerations no nodularity no palpable masses, no erythema or nodularity  Musculoskeletal: Right knee is bigger than the left knee there is a trace effusion he has excellent motion getting up near 125 degrees of flexion no flexion contracture medial joint line tenderness is noted  Normal ankle joint motion normal internal and external rotation normal inversion eversion with tenderness subtalar joint   ROS Tired night sweats hearing loss ringing in the ears ankle swelling light and sensitivity blurred vision heartburn constipation difficulty urination urgency frequency leg swelling joint pain muscle aches easy bruising dizziness headache  Past Medical History:  Diagnosis Date   Anxiety disorder, unspecified    Arthritis    Cancer ([place])    prostate  Cataract    Edema    Erectile dysfunction    Glaucoma    History of blood in urine    History of cataract surgery 09/26/2015   Both Eyes by Dr.Hunt.   History of colonoscopy 09/26/2007   Dr.LeBaur   Indigestion    Knee pain    Pain in unspecified hip    Prostate cancer [facility])    Urinary incontinence     Past Surgical History:  Procedure Laterality Date   CATARACT EXTRACTION W/PHACO Right 01/20/2016   Procedure: CATARACT EXTRACTION PHACO AND INTRAOCULAR LENS PLACEMENT (IOC);  Surgeon: Tonny Branch, MD;  Location: [facility];  Service: Ophthalmology;  Laterality: Right;  CDE 15.42    CATARACT EXTRACTION W/PHACO Left 03/06/2016   Procedure: CATARACT EXTRACTION PHACO AND INTRAOCULAR LENS PLACEMENT LEFT EYE;  CDE:  15.81;  Surgeon: Tonny Branch, MD;  Location: [facility];  Service: Ophthalmology;  Laterality: Left;   CHOLECYSTECTOMY  2011   COLONOSCOPY  07/03/2008   FRACTURE SURGERY  09/25/1966   [place] SURGERY  09/26/2007   Dr.Jenkins    HERNIA REPAIR Right 09/26/1991   Dr.Smith    PROSTATE SURGERY     cancer   SUPRAPUBIC PROSTATECTOMY  09/26/1995   Dr.Wrenn   VASECTOMY  09/25/1972   [facility].     Family History  Problem Relation Age of Onset   Healthy Mother    Hypertension Mother    Dementia Mother    Heart disease Father    Dementia Father    Heart failure Father    Heart disease Brother    Stroke Brother    Hypertension Son    Stroke Brother    Social History   Tobacco Use   Smoking status: Former    Packs/day: 0.50    Years: 16.00    Pack years: 8.00    Types: Cigarettes, Cigars    Quit date: 01/18/2004    Years since quitting: 17.2   Smokeless tobacco: Never  Vaping Use   Vaping Use: Never used  Substance Use Topics   Alcohol use: Yes    Alcohol/week: 1.0 standard drink    Types: 1 Cans of beer per week    Comment: 1 beer week   Drug use: No    No Known Allergies  Current Meds  Medication Sig   acetaminophen (TYLENOL) 325 MG tablet Take 650 mg by mouth every 6 (six) hours as needed.   calcium carbonate (TUMS EX) 750 MG chewable tablet Chew 1 tablet by mouth as needed for heartburn.   diclofenac Sodium (VOLTAREN) 1 % GEL Apply 4 g topically 2 (two) times daily as needed. Apply to right knee and ankle   Homeopathic Products (YEAST-GARD HOMEOPATHIC) GEL 1 application daily. Apply to genitals   MAGNESIUM PO Take 2 Doses by mouth at bedtime. 250 mg chewable total of 500 mg   melatonin 3 MG TABS tablet Take 3 mg by mouth at bedtime as needed.   Menthol, Topical Analgesic, (BIOFREEZE) 4 % GEL Apply  topically daily as needed.   naproxen sodium (ALEVE) 220 MG tablet Take 220 mg by mouth as needed (Headaches).     Meds ordered this encounter  Medications   meloxicam (MOBIC) 7.5 MG tablet    Sig: Take 1 tablet (7.5 mg total) by mouth daily.    Dispense:  30 tablet    Refill:  [place], MD  04/21/2021 3:19 PM

## 2021-04-21 NOTE — Patient Instructions (Signed)
Ice the injection sites for 30 min if you have pain   Start   Meds ordered this encounter  Medications   meloxicam (MOBIC) 7.5 MG tablet    Sig: Take 1 tablet (7.5 mg total) by mouth daily.    Dispense:  30 tablet    Refill:  5   If still having trouble please call us back

## 2021-09-12 ENCOUNTER — Ambulatory Visit (INDEPENDENT_AMBULATORY_CARE_PROVIDER_SITE_OTHER): Payer: Medicare HMO | Admitting: Orthopedic Surgery

## 2021-09-12 ENCOUNTER — Other Ambulatory Visit: Payer: Self-pay

## 2021-09-12 ENCOUNTER — Encounter: Payer: Self-pay | Admitting: Orthopedic Surgery

## 2021-09-12 VITALS — BP 140/82 | HR 66 | Temp 98.5°F | Resp 16 | Ht 67.0 in

## 2021-09-12 DIAGNOSIS — R059 Cough, unspecified: Secondary | ICD-10-CM | POA: Diagnosis not present

## 2021-09-12 DIAGNOSIS — R0981 Nasal congestion: Secondary | ICD-10-CM

## 2021-09-12 DIAGNOSIS — J069 Acute upper respiratory infection, unspecified: Secondary | ICD-10-CM | POA: Diagnosis not present

## 2021-09-12 LAB — POCT INFLUENZA A/B
Influenza A, POC: NEGATIVE
Influenza B, POC: NEGATIVE

## 2021-09-12 MED ORDER — PREDNISONE 10 MG PO TABS: ORAL_TABLET | ORAL | 0 refills | Status: AC

## 2021-09-12 MED ORDER — BENZONATATE 100 MG PO CAPS: 100.0000 mg | ORAL_CAPSULE | Freq: Two times a day (BID) | ORAL | 0 refills | Status: DC | PRN

## 2021-09-12 MED ORDER — AZITHROMYCIN 250 MG PO TABS: ORAL_TABLET | ORAL | 0 refills | Status: AC

## 2021-09-12 NOTE — Progress Notes (Signed)
Careteam: Patient Care Team: Lauree Chandler, NP as PCP - General (Geriatric Medicine) [name], MD as Consulting Physician (Cardiology) [name], MD as Attending Physician (Urology)  Seen by: Windell Moulding, AGNP-C  PLACE OF SERVICE:  [place] Directive information Does Patient Have a Medical Advance Directive?: Yes, Type of Advance Directive: Living will, Does patient want to make changes to medical advance directive?: No - Patient declined  No Known Allergies  Chief Complaint  Patient presents with   Acute Visit    Patient complains of nasal congestion, and chest pain when coughing.     HPI: Patient is a 85 y.o. male seen today for acute visit due to cold symptoms.   Wife present during encounter.   Symptoms began 12/13. He had a sore throat for one day. He then reports developing a dry cough. He has been coughing so much his chest has started to hurt. Nasal congestion started same time as cough. Described as clear. He does not report any alleviating or aggravating factors. He did a home covid test today and was negative. He is up to date on his flu vaccine. Denies chest pain, shortness of breath, body aches and chills.     Review of Systems:  Review of Systems  Constitutional:  Negative for chills, fever, malaise/fatigue and weight loss.  HENT:  Positive for congestion and sore throat. Negative for sinus pain.   Eyes:  Negative for discharge and redness.  Respiratory:  Positive for cough. Negative for sputum production, shortness of breath and wheezing.   Cardiovascular:  Negative for chest pain and leg swelling.  Gastrointestinal:  Negative for diarrhea, nausea and vomiting.  Musculoskeletal:  Negative for myalgias.  Neurological:  Negative for dizziness, weakness and headaches.  Psychiatric/Behavioral:  Negative for depression. The patient is nervous/anxious.    Past Medical History:  Diagnosis Date   Anxiety disorder, unspecified     Arthritis    Cancer ([place])    prostate   Cataract    Edema    Erectile dysfunction    Glaucoma    History of blood in urine    History of cataract surgery 09/26/2015   Both Eyes by Dr.Hunt.   History of colonoscopy 09/26/2007   Dr.LeBaur   Indigestion    Knee pain    Pain in unspecified hip    Prostate cancer [facility])    Urinary incontinence    Past Surgical History:  Procedure Laterality Date   CATARACT EXTRACTION W/PHACO Right 01/20/2016   Procedure: CATARACT EXTRACTION PHACO AND INTRAOCULAR LENS PLACEMENT (IOC);  Surgeon: Tonny Branch, MD;  Location: [facility];  Service: Ophthalmology;  Laterality: Right;  CDE 15.42   CATARACT EXTRACTION W/PHACO Left 03/06/2016   Procedure: CATARACT EXTRACTION PHACO AND INTRAOCULAR LENS PLACEMENT LEFT EYE;  CDE:  15.81;  Surgeon: Tonny Branch, MD;  Location: [facility];  Service: Ophthalmology;  Laterality: Left;   CHOLECYSTECTOMY  2011   COLONOSCOPY  07/03/2008   FRACTURE SURGERY  09/25/1966   [place] SURGERY  09/26/2007   Dr.Jenkins    HERNIA REPAIR Right 09/26/1991   Dr.Smith    PROSTATE SURGERY     cancer   SUPRAPUBIC PROSTATECTOMY  09/26/1995   Dr.Wrenn   VASECTOMY  09/25/1972   [facility].    Social History:   reports that he quit smoking about 17 years ago. His smoking use included cigarettes and cigars. He has a 8.00 pack-year smoking history. He  has never used smokeless tobacco. He reports current alcohol use of about 1.0 standard drink per week. He reports that he does not use drugs.  Family History  Problem Relation Age of Onset   Healthy Mother    Hypertension Mother    Dementia Mother    Heart disease Father    Dementia Father    Heart failure Father    Heart disease Brother    Stroke Brother    Hypertension Son    Stroke Brother     Medications: Patient's Medications  New Prescriptions   No medications on file  Previous Medications   ACETAMINOPHEN (TYLENOL) 325 MG TABLET    Take 650 mg  by mouth every 6 (six) hours as needed.   CALCIUM CARBONATE (TUMS EX) 750 MG CHEWABLE TABLET    Chew 1 tablet by mouth as needed for heartburn.   DICLOFENAC SODIUM (VOLTAREN) 1 % GEL    Apply 4 g topically 2 (two) times daily as needed. Apply to right knee and ankle   HOMEOPATHIC PRODUCTS (YEAST-GARD HOMEOPATHIC) GEL    1 application daily. Apply to genitals   MAGNESIUM PO    Take 2 Doses by mouth at bedtime. 250 mg chewable total of 500 mg   MELATONIN 3 MG TABS TABLET    Take 3 mg by mouth at bedtime as needed.   MELOXICAM (MOBIC) 7.5 MG TABLET    Take 1 tablet (7.5 mg total) by mouth daily.   MENTHOL, TOPICAL ANALGESIC, (BIOFREEZE) 4 % GEL    Apply topically daily as needed.   NAPROXEN SODIUM (ALEVE) 220 MG TABLET    Take 220 mg by mouth as needed (Headaches).  Modified Medications   No medications on file  Discontinued Medications   No medications on file    Physical Exam:  Vitals:   09/12/21 1437  BP: 140/82  Pulse: 66  Resp: 16  Temp: 98.5 F (36.9 C)  SpO2: 92%  Height: 5\' 7"  (1.702 m)   Body mass index is 27.72 kg/m. Wt Readings from Last 3 Encounters:  04/21/21 177 lb (80.3 kg)  03/18/21 181 lb (82.1 kg)  01/13/21 180 lb (81.6 kg)    Physical Exam Vitals reviewed.  Constitutional:      General: He is not in acute distress. HENT:     Head: Normocephalic.     Right Ear: There is no impacted cerumen.     Left Ear: There is no impacted cerumen.     Nose:     Right Turbinates: Enlarged.     Left Turbinates: Enlarged.     Mouth/Throat:     Mouth: Mucous membranes are moist.     Pharynx: No oropharyngeal exudate or posterior oropharyngeal erythema.  Eyes:     General:        Right eye: No discharge.        Left eye: No discharge.     Extraocular Movements: Extraocular movements intact.     Pupils: Pupils are equal, round, and reactive to light.  Neck:     Vascular: No carotid bruit.  Cardiovascular:     Rate and Rhythm: Normal rate and regular rhythm.      Pulses: Normal pulses.     Heart sounds: Normal heart sounds. No murmur heard. Pulmonary:     Effort: Pulmonary effort is normal.     Breath sounds: Examination of the right-upper field reveals wheezing and rhonchi. Examination of the left-upper field reveals wheezing and rhonchi. Wheezing and rhonchi present.  No decreased breath sounds.  Abdominal:     General: Bowel sounds are normal. There is no distension.     Palpations: Abdomen is soft.     Tenderness: There is no abdominal tenderness.  Musculoskeletal:     Cervical back: Normal range of motion.     Right lower leg: No edema.     Left lower leg: No edema.  Lymphadenopathy:     Cervical: No cervical adenopathy.  Skin:    General: Skin is warm and dry.     Capillary Refill: Capillary refill takes less than 2 seconds.  Neurological:     General: No focal deficit present.     Mental Status: He is alert and oriented to person, place, and time.  Psychiatric:        Mood and Affect: Mood normal.        Behavior: Behavior normal.    Labs reviewed: Basic Metabolic Panel: Recent Labs    03/18/21 1153  NA 140  K 4.4  CL 105  CO2 27  GLUCOSE 98  BUN 15  CREATININE 1.17*  CALCIUM 9.0   Liver Function Tests: Recent Labs    03/18/21 1153  AST 20  ALT 18  BILITOT 0.7  PROT 6.5   No results for input(s): LIPASE, AMYLASE in the last 8760 hours. No results for input(s): AMMONIA in the last 8760 hours. CBC: Recent Labs    03/18/21 1153  WBC 6.3  NEUTROABS 3,774  HGB 15.2  HCT 45.4  MCV 90.1  PLT 175   Lipid Panel: Recent Labs    03/18/21 1153  CHOL 189  HDL 53  LDLCALC 114*  TRIG 113  CHOLHDL 3.6   TSH: No results for input(s): TSH in the last 8760 hours. A1C: No results found for: HGBA1C   Assessment/Plan 1. Viral upper respiratory tract infection - symptoms began 1 week ago - rhonchi and wheezing to upper lobes during exam, appears tired - azithromycin (ZITHROMAX) 250 MG tablet; Take 2 tablets on  day 1, then 1 tablet daily on days 2 through 5  Dispense: 6 tablet; Refill: 0 - predniSONE (DELTASONE) 10 MG tablet; Take 4 tablets (40 mg total) by mouth daily with breakfast for 1 day, THEN 3 tablets (30 mg total) daily with breakfast for 1 day, THEN 2 tablets (20 mg total) daily with breakfast for 1 day, THEN 1 tablet (10 mg total) daily with breakfast for 1 day, THEN 0.5 tablets (5 mg total) daily with breakfast for 1 day.  Dispense: 10.5 tablet; Refill: 0 - benzonatate (TESSALON) 100 MG capsule; Take 1 capsule (100 mg total) by mouth 2 (two) times daily as needed for cough.  Dispense: 20 capsule; Refill: 0  2. Nasal congestion - SARS-COV-2 RNA,(COVID-19) QUAL NAAT- pending - POC Influenza A/B- negative  3. Cough, unspecified type - see above - SARS-COV-2 RNA,(COVID-19) QUAL NAAT - POC Influenza A/B  Total time: 21 minutes. Greater than 50% of total time spent doing patient education on symptom/medication management regarding cough.   Next appt: none Sam[facility] [place], [facility] [phone]

## 2021-09-12 NOTE — Patient Instructions (Addendum)
Please finish antibiotic  Start prednisone tomorrow- take every morning  Benzonatate capsules for dry cough- discontinue if cough becomes productive (start mucinex)  Hot steamy showers  Rest and fluids!!!!

## 2021-09-13 LAB — SARS-COV-2 RNA,(COVID-19) QUALITATIVE NAAT: SARS CoV2 RNA: NOT DETECTED

## 2021-11-08 ENCOUNTER — Other Ambulatory Visit: Payer: Self-pay

## 2021-11-08 ENCOUNTER — Ambulatory Visit
Admission: RE | Admit: 2021-11-08 | Discharge: 2021-11-08 | Disposition: A | Discharge status: Home / Self Care | Payer: Medicare HMO | Source: Ambulatory Visit | Attending: Nurse Practitioner | Admitting: Nurse Practitioner

## 2021-11-08 VITALS — BP 145/80 | HR 56 | Temp 98.1°F | Resp 18

## 2021-11-08 DIAGNOSIS — J358 Other chronic diseases of tonsils and adenoids: Secondary | ICD-10-CM

## 2021-11-08 NOTE — ED Triage Notes (Signed)
Pt states that he feels as if he has a piece of fritos stick in throat on Thursday, area is still very sore

## 2021-11-08 NOTE — Discharge Instructions (Addendum)
-  You a tonsil stone.  -Tonsil stones, also called tonsilloliths, are small lumps that form in your tonsils.  -This is not an infection, and you do not require antibiotics. -They're essentially small calcium deposits. These usually build up around food, dead cells, or mucus that you usually swallow, but can sometimes get caught in small pockets of the tonsils' mucus membrane coating. -The main symptom of tonsil stones is bad breath or throat irritation. Methods for tonsil stone removal at home include using a saltwater gargle or mouthwash, or gently using a q-tip.  -If the tonsil stones keep coming back or bothering you, you might need to follow-up with an ENT doctor.

## 2021-11-08 NOTE — ED Provider Notes (Signed)
[facility]    CSN: [id number] Arrival date & time: 11/08/21  0859      History   Chief Complaint Chief Complaint  Patient presents with   Sore Throat    HPI Kevin [name] is a 86 y.o. male presenting with globus sensation for about 5 days following eating a frito.  Medical history noncontributory.  States that his wife has been able to see a small speck in his throat, states this looks like an infection.  Denies pain, but there is sensation of foreign body in the throat with swallowing.  States wife tried to get this out with a Q-tip, and only got part of it out.  Denies fever/chills, cough, congestion.  HPI  Past Medical History:  Diagnosis Date   Anxiety disorder, unspecified    Arthritis    Cancer ([place])    prostate   Cataract    Edema    Erectile dysfunction    Glaucoma    History of blood in urine    History of cataract surgery 09/26/2015   Both Eyes by Dr.Hunt.   History of colonoscopy 09/26/2007   Dr.LeBaur   Indigestion    Knee pain    Pain in unspecified hip    Prostate cancer [facility])    Urinary incontinence     Patient Active Problem List   Diagnosis Date Noted   History of prostate cancer 10/20/2020   Chest pain 09/04/2019   Hematuria 09/04/2019   Dysphagia 09/04/2019   Pain in unspecified hip    Knee pain    Edema    Anxiety disorder, unspecified     Past Surgical History:  Procedure Laterality Date   CATARACT EXTRACTION W/PHACO Right 01/20/2016   Procedure: CATARACT EXTRACTION PHACO AND INTRAOCULAR LENS PLACEMENT ([place]);  Surgeon: Tonny Branch, MD;  Location: [facility];  Service: Ophthalmology;  Laterality: Right;  CDE 15.42   CATARACT EXTRACTION W/PHACO Left 03/06/2016   Procedure: CATARACT EXTRACTION PHACO AND INTRAOCULAR LENS PLACEMENT LEFT EYE;  CDE:  15.81;  Surgeon: Tonny Branch, MD;  Location: [facility];  Service: Ophthalmology;  Laterality: Left;   CHOLECYSTECTOMY  2011   COLONOSCOPY  07/03/2008   FRACTURE SURGERY  09/25/1966    [place] SURGERY  09/26/2007   Dr.Jenkins    HERNIA REPAIR Right 09/26/1991   Dr.Smith    PROSTATE SURGERY     cancer   SUPRAPUBIC PROSTATECTOMY  09/26/1995   Dr.Wrenn   VASECTOMY  09/25/1972   [facility]    Prior to Admission medications   Medication Sig Start Date End Date Taking? Authorizing Provider  acetaminophen (TYLENOL) 325 MG tablet Take 650 mg by mouth every 6 (six) hours as needed.    [provider]  benzonatate (TESSALON) 100 MG capsule Take 1 capsule (100 mg total) by mouth 2 (two) times daily as needed for cough. 09/12/21   [place], Amy E, NP  calcium carbonate (TUMS EX) 750 MG chewable tablet Chew 1 tablet by mouth as needed for heartburn.    [provider]  diclofenac Sodium (VOLTAREN) 1 % GEL Apply 4 g topically 2 (two) times daily as needed. Apply to right knee and ankle    [provider]  Homeopathic Products (YEAST-GARD HOMEOPATHIC) GEL 1 application daily. Apply to genitals    [provider]  MAGNESIUM PO Take 2 Doses by mouth at bedtime. 250 mg chewable total of 500 mg  [provider]  melatonin 3 MG TABS tablet Take 3 mg by mouth at bedtime as needed.    [provider]  meloxicam (MOBIC) 7.5 MG tablet Take 1 tablet (7.5 mg total) by mouth daily. 04/21/21   Carole Civil, MD  Menthol, Topical Analgesic, (BIOFREEZE) 4 % GEL Apply topically daily as needed.    [provider]  naproxen sodium (ALEVE) 220 MG tablet Take 220 mg by mouth as needed (Headaches).    [provider]    Family History Family History  Problem Relation Age of Onset   Healthy Mother    Hypertension Mother    Dementia Mother    Heart disease Father    Dementia Father    Heart failure Father    Heart disease Brother    Stroke Brother    Hypertension Son    Stroke Brother     Social History Social History   Tobacco Use   Smoking  status: Former    Packs/day: 0.50    Years: 16.00    Pack years: 8.00    Types: Cigarettes, Cigars    Quit date: 01/18/2004    Years since quitting: 17.8   Smokeless tobacco: Never  Vaping Use   Vaping Use: Never used  Substance Use Topics   Alcohol use: Yes    Alcohol/week: 1.0 standard drink    Types: 1 Cans of beer per week    Comment: 1 beer week   Drug use: No     Allergies   Patient has no known allergies.   Review of Systems Review of Systems  HENT:  Positive for trouble swallowing.   All other systems reviewed and are negative.   Physical Exam Triage Vital Signs ED Triage Vitals  Enc Vitals Group     BP 11/08/21 0927 (!) 145/80     Pulse Rate 11/08/21 0927 (!) 56     Resp 11/08/21 0927 18     Temp 11/08/21 0927 98.1 F (36.7 C)     Temp src --      SpO2 11/08/21 0927 98 %     Weight --      Height --      Head Circumference --      Peak Flow --      Pain Score 11/08/21 0925 3     Pain Loc --      Pain Edu? --      Excl. in [place]? --    No data found.  Updated Vital Signs BP (!) 145/80    Pulse (!) 56    Temp 98.1 F (36.7 C)    Resp 18    SpO2 98%   Visual Acuity Right Eye Distance:   Left Eye Distance:   Bilateral Distance:    Right Eye Near:   Left Eye Near:    Bilateral Near:     Physical Exam Vitals reviewed.  Constitutional:      General: He is not in acute distress.    Appearance: Normal appearance. He is not ill-appearing or diaphoretic.  HENT:     Head: Normocephalic and atraumatic.     Mouth/Throat:     Comments: Left tonsil with small 1 mm tonsil stone visible of the superior aspect.  No surrounding erythema, exudate.  Uvula midline. Cardiovascular:     Rate and Rhythm: Normal rate and regular rhythm.     Heart sounds: Normal heart sounds.  Pulmonary:     Effort: Pulmonary effort is  normal.     Breath sounds: Normal breath sounds.  Skin:    General: Skin is warm.  Neurological:     General: No focal deficit present.      Mental Status: He is alert and oriented to person, place, and time.  Psychiatric:        Mood and Affect: Mood normal.        Behavior: Behavior normal.        Thought Content: Thought content normal.        Judgment: Judgment normal.     UC Treatments / Results  Labs (all labs ordered are listed, but only abnormal results are displayed) Labs Reviewed - No data to display  EKG   Radiology No results found.  Procedures Procedures (including critical care time)  Medications Ordered in UC Medications - No data to display  Initial Impression / Assessment and Plan / UC Course  I have reviewed the triage vital signs and the nursing notes.  Pertinent labs & imaging results that were available during my care of the patient were reviewed by me and considered in my medical decision making (see chart for details).     This patient is a very pleasant 86 y.o. year old male presenting with tonsil stone. Afebrile, nontachy. Reassurance provided. Rec warm salt water gargles, or gentle extraction with q-tip at home. ED return precautions discussed. Patient verbalizes understanding and agreement.     Final Clinical Impressions(s) / UC Diagnoses   Final diagnoses:  Tonsil stone     Discharge Instructions      -You a tonsil stone.  -Tonsil stones, also called tonsilloliths, are small lumps that form in your tonsils.  -This is not an infection, and you do not require antibiotics. -They're essentially small calcium deposits. These usually build up around food, dead cells, or mucus that you usually swallow, but can sometimes get caught in small pockets of the tonsils' mucus membrane coating. -The main symptom of tonsil stones is bad breath or throat irritation. Methods for tonsil stone removal at home include using a saltwater gargle or mouthwash, or gently using a q-tip.  -If the tonsil stones keep coming back or bothering you, you might need to follow-up with an ENT doctor.    ED  Prescriptions   None    PDMP not reviewed this encounter.   Hazel Sams, PA-C 11/08/21 [phone]

## 2021-12-07 ENCOUNTER — Other Ambulatory Visit: Payer: Self-pay

## 2021-12-07 ENCOUNTER — Encounter: Payer: Self-pay | Admitting: Adult Health

## 2021-12-07 ENCOUNTER — Ambulatory Visit (INDEPENDENT_AMBULATORY_CARE_PROVIDER_SITE_OTHER): Payer: Medicare HMO | Admitting: Adult Health

## 2021-12-07 VITALS — BP 126/84 | HR 55 | Temp 97.8°F | Ht 67.0 in | Wt 185.2 lb

## 2021-12-07 DIAGNOSIS — M545 Low back pain, unspecified: Secondary | ICD-10-CM | POA: Diagnosis not present

## 2021-12-07 NOTE — Progress Notes (Signed)
? ?Location:  [place] ?  ?Place of Service:   clinic  ? ? ?CODE STATUS: full  ? ?No Known Allergies ? ?Chief Complaint  ?Patient presents with  ? Acute Visit  ?  Back pain. Patient states that its have been going on for a few weeks. Patient states that he started using a vibrator for his back and it has relieved some of the pain.   ? ? ?HPI: ? ?He has been raking his yard over the past couple of weeks. He has developed right lower back pain for the past couple of weeks. It has gotten worse over the past couple of days. He has been using a vibrating chair which has helped relieve his pain. There is no numbness or tingling  present; no radiating pain. Marland Kitchen He is taking 2 aleve nightly with  relief of pain.  ? ?Past Medical History:  ?Diagnosis Date  ? Anxiety disorder, unspecified   ? Arthritis   ? Cancer [facility])   ? prostate  ? Cataract   ? Edema   ? Erectile dysfunction   ? Glaucoma   ? History of blood in urine   ? History of cataract surgery 09/26/2015  ? Both Eyes by Dr.Hunt.  ? History of colonoscopy 09/26/2007  ? Dr.LeBaur  ? Indigestion   ? Knee pain   ? Pain in unspecified hip   ? Prostate cancer ([place])   ? Urinary incontinence   ? ? ?Past Surgical History:  ?Procedure Laterality Date  ? CATARACT EXTRACTION W/PHACO Right 01/20/2016  ? Procedure: CATARACT EXTRACTION PHACO AND INTRAOCULAR LENS PLACEMENT (IOC);  Surgeon: Tonny Branch, MD;  Location: [facility];  Service: Ophthalmology;  Laterality: Right;  CDE 15.42  ? CATARACT EXTRACTION W/PHACO Left 03/06/2016  ? Procedure: CATARACT EXTRACTION PHACO AND INTRAOCULAR LENS PLACEMENT LEFT EYE;  CDE:  15.81;  Surgeon: Tonny Branch, MD;  Location: [facility];  Service: Ophthalmology;  Laterality: Left;  ? CHOLECYSTECTOMY  2011  ? COLONOSCOPY  07/03/2008  ? FRACTURE SURGERY  09/25/1966  ? [place] SURGERY  09/26/2007  ? Dr.Jenkins   ? HERNIA REPAIR Right 09/26/1991  ? Dr.Smith   ? PROSTATE SURGERY    ? cancer  ? SUPRAPUBIC PROSTATECTOMY   09/26/1995  ? Dr.Wrenn  ? VASECTOMY  09/25/1972  ? [facility].   ? ? ?Social History  ? ?Socioeconomic History  ? Marital status: Married  ?  Spouse name: Not on file  ? Number of children: 3  ? Years of education: Not on file  ? Highest education level: Not on file  ?Occupational History  ? Occupation: retired  ?Tobacco Use  ? Smoking status: Former  ?  Packs/day: 0.50  ?  Years: 16.00  ?  Pack years: 8.00  ?  Types: Cigarettes, Cigars  ?  Quit date: 01/18/2004  ?  Years since quitting: 17.8  ? Smokeless tobacco: Never  ?Vaping Use  ? Vaping Use: Never used  ?Substance and Sexual Activity  ? Alcohol use: Yes  ?  Alcohol/week: 1.0 standard drink  ?  Types: 1 Cans of beer per week  ?  Comment: 1 beer week  ? Drug use: No  ? Sexual activity: Not Currently  ?  Birth control/protection: None  ?Other Topics Concern  ? Not on file  ?Social History Narrative  ? Tobacco use, amount per day now: Non smoker for 16 years.  ? Past tobacco use, amount per day: 1/2 Pack  of cigarettes a day or 3 cigars.   ? How many years did you use tobacco: 15 years.  ? Alcohol use (drinks per week): 0-2 drinks.  ? Diet: Normal.   ? Do you drink/eat things with caffeine: Yes.  ? Marital status:  Married                                What year were you married? 1981  ? Do you live in a house, apartment, assisted living, condo, trailer, etc.? House.  ? Is it one or more stories? 2 stories.   ? How many persons live in your home? 2  ? Do you have pets in your home?( please list)  Yes 2 dogs, 1 cat.  ? Highest Level of education completed: High School GED.  ? Current or past profession: Hydrologist.   ? Do you exercise? Not regularly.                                   ? Type and how often? Working outside on yard.   ? Do you have a living will? Yes.  ? Do you have a DNR form?  Yes.                                 If not, do you want to discuss one?  ? Do you have signed POA/HPOA forms? Yes.                       If  so, please bring to you appointment   ? Do you have any difficulty bathing or dressing yourself? No.   ? Do you have difficulty preparing food or eating? No.  ? Do you have difficulty managing your medications? No.  ? Do you have any difficulty managing your finances? No.   ? Do you have any difficulty affording your medications? No.  ?    ? ?Social Determinants of Health  ? ?Financial Resource Strain: Not on file  ?Food Insecurity: Not on file  ?Transportation Needs: Not on file  ?Physical Activity: Not on file  ?Stress: Not on file  ?Social Connections: Not on file  ?Intimate Partner Violence: Not on file  ? ?Family History  ?Problem Relation Age of Onset  ? Healthy Mother   ? Hypertension Mother   ? Dementia Mother   ? Heart disease Father   ? Dementia Father   ? Heart failure Father   ? Heart disease Brother   ? Stroke Brother   ? Hypertension Son   ? Stroke Brother   ? ? ? ? ?VITAL SIGNS ?BP 126/84 (BP Location: Left Arm, Patient Position: Sitting)   Pulse (!) 55   Temp 97.8 ?F (36.6 ?C) (Temporal)   Ht '5\' 7"'$  (1.702 m)   Wt 185 lb 3.2 oz (84 kg)   SpO2 99%   BMI 29.01 kg/m?  ? ?Outpatient Encounter Medications as of 12/07/2021  ?Medication Sig  ? acetaminophen (TYLENOL) 325 MG tablet Take 650 mg by mouth every 6 (six) hours as needed.  ? calcium carbonate (TUMS EX) 750 MG chewable tablet Chew 1 tablet by mouth as needed for heartburn.  ? diclofenac Sodium (VOLTAREN) 1 % GEL Apply 4 g topically 2 (two) times  daily as needed. Apply to right knee and ankle  ? Homeopathic Products (YEAST-GARD HOMEOPATHIC) GEL 1 application daily. Apply to genitals  ? MAGNESIUM PO Take 2 Doses by mouth at bedtime. 250 mg chewable total of 500 mg  ? melatonin 3 MG TABS tablet Take 3 mg by mouth at bedtime as needed.  ? meloxicam (MOBIC) 7.5 MG tablet Take 1 tablet (7.5 mg total) by mouth daily.  ? Menthol, Topical Analgesic, (BIOFREEZE) 4 % GEL Apply topically daily as needed.  ? naproxen sodium (ALEVE) 220 MG tablet Take 220 mg  by mouth as needed (Headaches).  ? [DISCONTINUED] benzonatate (TESSALON) 100 MG capsule Take 1 capsule (100 mg total) by mouth 2 (two) times daily as needed for cough. (Patient not taking: Reported on 12/07/2021)  ? ?No facility-administered encounter medications on file as of 12/07/2021.  ? ? ? ?SIGNIFICANT DIAGNOSTIC EXAMS ? ?Review of Systems  ?Constitutional:  Negative for malaise/fatigue.  ?Respiratory:  Negative for cough and shortness of breath.   ?Cardiovascular:  Negative for chest pain, palpitations and leg swelling.  ?Gastrointestinal:  Negative for abdominal pain, constipation and heartburn.  ?Musculoskeletal:  Positive for back pain. Negative for joint pain and myalgias.  ?Skin: Negative.   ?Neurological:  Negative for dizziness.  ?Psychiatric/Behavioral:  The patient is not nervous/anxious.   ? ? ?Physical Exam ?Constitutional:   ?   General: He is not in acute distress. ?   Appearance: He is well-developed. He is not diaphoretic.  ?Neck:  ?   Thyroid: No thyromegaly.  ?Cardiovascular:  ?   Rate and Rhythm: Normal rate and regular rhythm.  ?   Pulses: Normal pulses.  ?   Heart sounds: Normal heart sounds.  ?Pulmonary:  ?   Effort: Pulmonary effort is normal. No respiratory distress.  ?   Breath sounds: Normal breath sounds.  ?Abdominal:  ?   General: Bowel sounds are normal. There is no distension.  ?   Palpations: Abdomen is soft.  ?   Tenderness: There is no abdominal tenderness.  ?Musculoskeletal:     ?   General: No tenderness. Normal range of motion.  ?   Cervical back: Neck supple.  ?   Right lower leg: No edema.  ?   Left lower leg: No edema.  ?Lymphadenopathy:  ?   Cervical: No cervical adenopathy.  ?Skin: ?   General: Skin is warm and dry.  ?Neurological:  ?   Mental Status: He is alert and oriented to person, place, and time.  ?Psychiatric:     ?   Mood and Affect: Mood normal.  ? ? ? ? ?ASSESSMENT/ PLAN: ? ?TODAY ? ?Right lower back pain: will continue to use aleve up to 2 times daily as  needed for pain. Will continue to use vibrating chair. He has declined a muscle relaxant at this time.  ? ? ?Ok Edwards NP ?[facility]  ?call [phone]  ? ?

## 2021-12-07 NOTE — Patient Instructions (Signed)
You may take one aleve twice day as you need to for pain ?Continue to use vibrating  chair as needed  ?

## 2022-01-02 ENCOUNTER — Ambulatory Visit (INDEPENDENT_AMBULATORY_CARE_PROVIDER_SITE_OTHER): Payer: Medicare HMO | Admitting: Nurse Practitioner

## 2022-01-02 ENCOUNTER — Encounter: Payer: Self-pay | Admitting: Nurse Practitioner

## 2022-01-02 VITALS — BP 126/78 | HR 52 | Temp 97.3°F | Ht 67.0 in | Wt 186.0 lb

## 2022-01-02 DIAGNOSIS — F321 Major depressive disorder, single episode, moderate: Secondary | ICD-10-CM

## 2022-01-02 DIAGNOSIS — R1310 Dysphagia, unspecified: Secondary | ICD-10-CM

## 2022-01-02 DIAGNOSIS — M159 Polyosteoarthritis, unspecified: Secondary | ICD-10-CM | POA: Diagnosis not present

## 2022-01-02 DIAGNOSIS — R413 Other amnesia: Secondary | ICD-10-CM

## 2022-01-02 DIAGNOSIS — R69 Illness, unspecified: Secondary | ICD-10-CM | POA: Diagnosis not present

## 2022-01-02 DIAGNOSIS — K219 Gastro-esophageal reflux disease without esophagitis: Secondary | ICD-10-CM | POA: Diagnosis not present

## 2022-01-02 MED ORDER — ESOMEPRAZOLE MAGNESIUM 40 MG PO CPDR: 40.0000 mg | DELAYED_RELEASE_CAPSULE | Freq: Every day | ORAL | 2 refills | Status: DC

## 2022-01-02 MED ORDER — SERTRALINE HCL 25 MG PO TABS: 25.0000 mg | ORAL_TABLET | Freq: Every day | ORAL | 1 refills | Status: DC

## 2022-01-02 NOTE — Patient Instructions (Addendum)
To stay active- physically and mentally.  ? ?Try weighted blanket to help sleep.  ? ?To start zoloft 25 mg  ? ?STOP aleve- take tylenol 500 mg 1-2 tablets every 8 hours as needed for pain- replace aleve with tylenol . ?

## 2022-01-02 NOTE — Progress Notes (Signed)
? ? ?Careteam: ?Patient Care Team: ?Lauree Chandler, NP as PCP - General (Geriatric Medicine) ?[name], MD as Consulting Physician (Cardiology) ?[name], MD as Attending Physician (Urology) ? ?PLACE OF SERVICE:  ?[facility]  ?Advanced Directive information ?Does Patient Have a Medical Advance Directive?: Yes, Type of Advance Directive: Living will, Does patient want to make changes to medical advance directive?: No - Patient declined ? ?No Known Allergies ? ?Chief Complaint  ?Patient presents with  ? Medical Management of Chronic Issues  ?  6 month follow-up. Discuss need for td/tdap, shingrix, PCV, and additional covid boosters or post pone if patient refuses. [facility] verified   ? ? ? ?HPI: Patient is a 86 y.o. male for routine follow up.  ? ?Was referred to ENT, does not remember getting the appt. Tinnitus continues to be bothersome but stable. Does not want to see specialist at this time.  ? ?Has interesting sound after he eats. Wife reports it is getting worse. Reports symptoms of food getting stuck in the past, now it is more medication. Can only eat chicken- other meat makes it worse ?Wife  sees someone in [facility] woud like referral there for further evaluation  ? ?Having a hard time with his short term memory, scares his wife because he can not get to places that he generally goes to. Wife is tracking him. Overtime has gotten worse but wife now worried due to some of the more recent things he has forgotten.  ? ?Focuses on his mothers death- wife worries about depression.  ?Lots of emotions with mother and around her death.  ? ? ?Review of Systems:  ?Review of Systems  ?Constitutional:  Negative for chills, fever and weight loss.  ?HENT:  Negative for tinnitus.   ?Respiratory:  Negative for cough, sputum production and shortness of breath.   ?Cardiovascular:  Negative for chest pain, palpitations and leg swelling.  ?Gastrointestinal:  Negative for abdominal pain, constipation, diarrhea  and heartburn.  ?Genitourinary:  Negative for dysuria, frequency and urgency.  ?Musculoskeletal:  Negative for back pain, falls, joint pain and myalgias.  ?Skin: Negative.   ?Neurological:  Negative for dizziness and headaches.  ?Psychiatric/Behavioral:  Positive for depression and memory loss. The patient does not have insomnia.   ? ?Past Medical History:  ?Diagnosis Date  ? Anxiety disorder, unspecified   ? Arthritis   ? Cancer [facility])   ? prostate  ? Cataract   ? Edema   ? Erectile dysfunction   ? Glaucoma   ? History of blood in urine   ? History of cataract surgery 09/26/2015  ? Both Eyes by Dr.Hunt.  ? History of colonoscopy 09/26/2007  ? Dr.LeBaur  ? Indigestion   ? Knee pain   ? Pain in unspecified hip   ? Prostate cancer ([place])   ? Urinary incontinence   ? ?Past Surgical History:  ?Procedure Laterality Date  ? CATARACT EXTRACTION W/PHACO Right 01/20/2016  ? Procedure: CATARACT EXTRACTION PHACO AND INTRAOCULAR LENS PLACEMENT (IOC);  Surgeon: Tonny Branch, MD;  Location: [facility];  Service: Ophthalmology;  Laterality: Right;  CDE 15.42  ? CATARACT EXTRACTION W/PHACO Left 03/06/2016  ? Procedure: CATARACT EXTRACTION PHACO AND INTRAOCULAR LENS PLACEMENT LEFT EYE;  CDE:  15.81;  Surgeon: Tonny Branch, MD;  Location: [facility];  Service: Ophthalmology;  Laterality: Left;  ? CHOLECYSTECTOMY  2011  ? COLONOSCOPY  07/03/2008  ? FRACTURE SURGERY  09/25/1966  ? [place]  09/26/2007  ? Dr.Jenkins   ? HERNIA REPAIR Right 09/26/1991  ? Dr.Smith   ? PROSTATE SURGERY    ? cancer  ? SUPRAPUBIC PROSTATECTOMY  09/26/1995  ? Dr.Wrenn  ? VASECTOMY  09/25/1972  ? [facility].   ? ?Social History: ?  reports that he quit smoking about 17 years ago. His smoking use included cigarettes and cigars. He has a 8.00 pack-year smoking history. He has never used smokeless tobacco. He reports current alcohol use of about 1.0 standard drink per week. He reports that he does not use drugs. ? ?Family History   ?Problem Relation Age of Onset  ? Healthy Mother   ? Hypertension Mother   ? Dementia Mother   ? Heart disease Father   ? Dementia Father   ? Heart failure Father   ? Heart disease Brother   ? Stroke Brother   ? Hypertension Son   ? Stroke Brother   ? ? ?Medications: ?Patient's Medications  ?New Prescriptions  ? No medications on file  ?Previous Medications  ? ACETAMINOPHEN (TYLENOL) 325 MG TABLET    Take 650 mg by mouth every 6 (six) hours as needed.  ? CALCIUM CARBONATE (TUMS EX) 750 MG CHEWABLE TABLET    Chew 1 tablet by mouth as needed for heartburn.  ? DICLOFENAC SODIUM (VOLTAREN) 1 % GEL    Apply 4 g topically 2 (two) times daily as needed. Apply to right knee and ankle  ? HOMEOPATHIC PRODUCTS (YEAST-GARD HOMEOPATHIC) GEL    1 application. as directed. Apply to genitals 3-4 times daily  ? MAGNESIUM PO    Take 2 Doses by mouth at bedtime. 250 mg chewable total of 500 mg  ? MELATONIN 3 MG TABS TABLET    Take 3 mg by mouth at bedtime as needed.  ? MELOXICAM (MOBIC) 7.5 MG TABLET    Take 1 tablet (7.5 mg total) by mouth daily.  ? MENTHOL, TOPICAL ANALGESIC, (BIOFREEZE) 4 % GEL    Apply topically daily as needed.  ? NAPROXEN SODIUM (ALEVE) 220 MG TABLET    Take 220 mg by mouth as needed (Headaches).  ?Modified Medications  ? No medications on file  ?Discontinued Medications  ? No medications on file  ? ? ?Physical Exam: ? ?Vitals:  ? 01/02/22 0924  ?BP: 126/78  ?Pulse: (!) 52  ?Temp: (!) 97.3 ?F (36.3 ?C)  ?TempSrc: Temporal  ?SpO2: 98%  ?Weight: 186 lb (84.4 kg)  ?Height: '5\' 7"'$  (1.702 m)  ? ?Body mass index is 29.13 kg/m?. ?Wt Readings from Last 3 Encounters:  ?01/02/22 186 lb (84.4 kg)  ?12/07/21 185 lb 3.2 oz (84 kg)  ?04/21/21 177 lb (80.3 kg)  ? ? ?Physical Exam ?Constitutional:   ?   General: He is not in acute distress. ?   Appearance: He is well-developed. He is not diaphoretic.  ?HENT:  ?   Head: Normocephalic and atraumatic.  ?   Right Ear: External ear normal.  ?   Left Ear: External ear normal.  ?    Mouth/Throat:  ?   Pharynx: No oropharyngeal exudate.  ?Eyes:  ?   Conjunctiva/sclera: Conjunctivae normal.  ?   Pupils: Pupils are equal, round, and reactive to light.  ?Cardiovascular:  ?   Rate and Rhythm: Normal rate and regular rhythm.  ?   Heart sounds: Normal heart sounds.  ?Pulmonary:  ?   Effort: Pulmonary effort is normal.  ?   Breath sounds: Normal breath sounds.  ?Abdominal:  ?  General: Bowel sounds are normal.  ?   Palpations: Abdomen is soft.  ?Musculoskeletal:     ?   General: No tenderness.  ?   Cervical back: Normal range of motion and neck supple.  ?   Right lower leg: No edema.  ?   Left lower leg: No edema.  ?Skin: ?   General: Skin is warm and dry.  ?Neurological:  ?   Mental Status: He is alert and oriented to person, place, and time.  ?Psychiatric:     ?   Mood and Affect: Mood normal.  ?   Comments: Forgetful during visit  ? ? ? ?Labs reviewed: ?Basic Metabolic Panel: ?Recent Labs  ?  03/18/21 ?1153  ?NA 140  ?K 4.4  ?CL 105  ?CO2 27  ?GLUCOSE 98  ?BUN 15  ?CREATININE 1.17*  ?CALCIUM 9.0  ? ?Liver Function Tests: ?Recent Labs  ?  03/18/21 ?1153  ?AST 20  ?ALT 18  ?BILITOT 0.7  ?PROT 6.5  ? ?No results for input(s): LIPASE, AMYLASE in the last 8760 hours. ?No results for input(s): AMMONIA in the last 8760 hours. ?CBC: ?Recent Labs  ?  03/18/21 ?1153  ?WBC 6.3  ?NEUTROABS 3,774  ?HGB 15.2  ?HCT 45.4  ?MCV 90.1  ?PLT 175  ? ?Lipid Panel: ?Recent Labs  ?  03/18/21 ?1153  ?CHOL 189  ?HDL 53  ?LDLCALC 114*  ?TRIG 113  ?CHOLHDL 3.6  ? ?TSH: ?No results for input(s): TSH in the last 8760 hours. ?A1C: ?No results found for: HGBA1C ? ? ?Assessment/Plan ?1. Dysphagia, unspecified type ?-trouble swallowing noted and feeling that food is getting stuck ?- Ambulatory referral to Gastroenterology for further evaluation and recommendations.  ?-will start nexium at this time to see if this helps with symptoms. ?- esomeprazole (NEXIUM) 40 MG capsule; Take 1 capsule (40 mg total) by mouth daily at 12 noon.   Dispense: 30 capsule; Refill: 2 ? ?2. Osteoarthritis of multiple joints, unspecified osteoarthritis type ?-continues mobic daily, recommend not to use mobic and aleve, STOP alevel. can use tylenol 1000 mg by mo

## 2022-01-03 ENCOUNTER — Other Ambulatory Visit: Payer: Self-pay

## 2022-01-03 LAB — COMPLETE METABOLIC PANEL WITH GFR
AG Ratio: 1.6 (calc) (ref 1.0–2.5)
ALT: 18 U/L (ref 9–46)
AST: 19 U/L (ref 10–35)
Albumin: 4.2 g/dL (ref 3.6–5.1)
Alkaline phosphatase (APISO): 63 U/L (ref 35–144)
BUN: 21 mg/dL (ref 7–25)
CO2: 31 mmol/L (ref 20–32)
Calcium: 9.4 mg/dL (ref 8.6–10.3)
Chloride: 102 mmol/L (ref 98–110)
Creat: 1.14 mg/dL (ref 0.70–1.22)
Globulin: 2.6 g/dL (calc) (ref 1.9–3.7)
Glucose, Bld: 97 mg/dL (ref 65–99)
Potassium: 4.9 mmol/L (ref 3.5–5.3)
Sodium: 138 mmol/L (ref 135–146)
Total Bilirubin: 1.1 mg/dL (ref 0.2–1.2)
Total Protein: 6.8 g/dL (ref 6.1–8.1)
eGFR: 64 mL/min/{1.73_m2} (ref >=60)

## 2022-01-03 LAB — CBC WITH DIFFERENTIAL/PLATELET
Absolute Monocytes: 566 cells/uL (ref 200–950)
Basophils Absolute: 41 cells/uL (ref 0–200)
Basophils Relative: 0.7 %
Eosinophils Absolute: 71 cells/uL (ref 15–500)
Eosinophils Relative: 1.2 %
HCT: 47.3 % (ref 38.5–50.0)
Hemoglobin: 16.2 g/dL (ref 13.2–17.1)
Lymphs Abs: 1882 cells/uL (ref 850–3900)
MCH: 30 pg (ref 27.0–33.0)
MCHC: 34.2 g/dL (ref 32.0–36.0)
MCV: 87.6 fL (ref 80.0–100.0)
MPV: 9.8 fL (ref 7.5–12.5)
Monocytes Relative: 9.6 %
Neutro Abs: 3339 cells/uL (ref 1500–7800)
Neutrophils Relative %: 56.6 %
Platelets: 198 10*3/uL (ref 140–400)
RBC: 5.4 10*6/uL (ref 4.20–5.80)
RDW: 13.1 % (ref 11.0–15.0)
Total Lymphocyte: 31.9 %
WBC: 5.9 10*3/uL (ref 3.8–10.8)

## 2022-01-03 LAB — TSH: TSH: 1.46 mIU/L (ref 0.40–4.50)

## 2022-01-03 LAB — RPR: RPR Ser Ql: NONREACTIVE

## 2022-01-03 LAB — VITAMIN B12: Vitamin B-12: 273 pg/mL (ref 200–1100)

## 2022-01-05 ENCOUNTER — Other Ambulatory Visit: Payer: Medicare HMO

## 2022-01-05 DIAGNOSIS — Z8546 Personal history of malignant neoplasm of prostate: Secondary | ICD-10-CM | POA: Diagnosis not present

## 2022-01-06 LAB — PSA: Prostate Specific Ag, Serum: 0.7 ng/mL (ref 0.0–4.0)

## 2022-01-12 ENCOUNTER — Telehealth: Payer: Self-pay

## 2022-01-12 ENCOUNTER — Encounter: Payer: Self-pay | Admitting: Urology

## 2022-01-12 ENCOUNTER — Ambulatory Visit: Payer: Medicare HMO | Admitting: Urology

## 2022-01-12 ENCOUNTER — Other Ambulatory Visit: Payer: Self-pay

## 2022-01-12 VITALS — BP 162/62 | HR 56

## 2022-01-12 DIAGNOSIS — Z8546 Personal history of malignant neoplasm of prostate: Secondary | ICD-10-CM | POA: Diagnosis not present

## 2022-01-12 DIAGNOSIS — R310 Gross hematuria: Secondary | ICD-10-CM

## 2022-01-12 DIAGNOSIS — R9721 Rising PSA following treatment for malignant neoplasm of prostate: Secondary | ICD-10-CM | POA: Diagnosis not present

## 2022-01-12 DIAGNOSIS — N393 Stress incontinence (female) (male): Secondary | ICD-10-CM

## 2022-01-12 DIAGNOSIS — Z87898 Personal history of other specified conditions: Secondary | ICD-10-CM

## 2022-01-12 LAB — URINALYSIS, ROUTINE W REFLEX MICROSCOPIC
Bilirubin, UA: NEGATIVE
Glucose, UA: NEGATIVE
Ketones, UA: NEGATIVE
Leukocytes,UA: NEGATIVE
Nitrite, UA: NEGATIVE
Protein,UA: NEGATIVE
RBC, UA: NEGATIVE
Specific Gravity, UA: 1.02 (ref 1.005–1.030)
Urobilinogen, Ur: 1 mg/dL (ref 0.2–1.0)
pH, UA: 7 (ref 5.0–7.5)

## 2022-01-12 NOTE — Progress Notes (Signed)
?Subjective: ?1. History of prostate cancer   ?2. Rising PSA following treatment for malignant neoplasm of prostate   ?3. Male stress incontinence   ?4. History of gross hematuria   ?  ? ?01/12/22: Kevin [name] returns today in f/u for his history of prostate cancer and hematuria.  He has incontinence that requires 3ppd. His IPSS is 20 with a reduced stream and nocturia x 3. p His PSA is up further to 0.7 this year. It was 0.6 in 4/22, 0.4 in 2/21 and 0.5 in 12/20.   He has no weight loss or bone pain.   ? ?01/13/21: Kevin [name] returns today in f/u.  He had a negative hematuria w/u last year.  His PSA in 2/21 was stable at 0.4.  He had a radical prostatectomy in 1997.  He has incontinence and is using 3ppd.  He has worse leakage with activity. His IPSS is 17.   ? ? ?01/16/20: Kevin [name] is an 86 yo WM who is sent back in consultation by Dr. Holly Bodily for an episode of gross hematuria in November.   He had blood on 3 voids with dysuria.  He has not seen it since.   He has no history of stones or UTI's.  He had some low back pain at that time as well.  He has a history of prostate cancer.  He had a radical prostatectomy in about 1997 and was last seen by me in 2002.   His recent PSA was 0.5.  The last I had recorded was 0.03.   He has had postop SUI and ED.   His UA was clear on 09/04/19 and today.   His Cr was 1.2 on 09/04/19.  He has moderate to severe LUTS with an IPSS of 22.  He wears about 3ppd. ? ? IPSS   ? ? [place] Name 01/12/22 1100  ?  ?  ?  ? International Prostate Symptom Score  ? How often have you had the sensation of not emptying your bladder? Less than half the time    ? How often have you had to urinate less than every two hours? Less than 1 in 5 times    ? How often have you found you stopped and started again several times when you urinated? About half the time    ? How often have you found it difficult to postpone urination? About half the time    ? How often have you had a weak urinary stream? Almost always    ?  How often have you had to strain to start urination? About half the time    ? How many times did you typically get up at night to urinate? 3 Times    ? Total IPSS Score 20    ?  ? Quality of Life due to urinary symptoms  ? If you were to spend the rest of your life with your urinary condition just the way it is now how would you feel about that? Mixed    ? ?  ?  ? ?  ? ? ?ROS: ? ?ROS ? ?No Known Allergies ? ?Past Medical History:  ?Diagnosis Date  ? Anxiety disorder, unspecified   ? Arthritis   ? Cancer [facility])   ? prostate  ? Cataract   ? Edema   ? Erectile dysfunction   ? Glaucoma   ? History of blood in urine   ? History of cataract surgery 09/26/2015  ? Both Eyes by Dr.Hunt.  ? History  of colonoscopy 09/26/2007  ? Dr.LeBaur  ? Indigestion   ? Knee pain   ? Pain in unspecified hip   ? Prostate cancer ([place])   ? Urinary incontinence   ? ? ?Past Surgical History:  ?Procedure Laterality Date  ? CATARACT EXTRACTION W/PHACO Right 01/20/2016  ? Procedure: CATARACT EXTRACTION PHACO AND INTRAOCULAR LENS PLACEMENT (IOC);  Surgeon: Tonny Branch, MD;  Location: [facility];  Service: Ophthalmology;  Laterality: Right;  CDE 15.42  ? CATARACT EXTRACTION W/PHACO Left 03/06/2016  ? Procedure: CATARACT EXTRACTION PHACO AND INTRAOCULAR LENS PLACEMENT LEFT EYE;  CDE:  15.81;  Surgeon: Tonny Branch, MD;  Location: [facility];  Service: Ophthalmology;  Laterality: Left;  ? CHOLECYSTECTOMY  2011  ? COLONOSCOPY  07/03/2008  ? FRACTURE SURGERY  09/25/1966  ? [place] SURGERY  09/26/2007  ? Dr.Jenkins   ? HERNIA REPAIR Right 09/26/1991  ? Dr.Smith   ? PROSTATE SURGERY    ? cancer  ? SUPRAPUBIC PROSTATECTOMY  09/26/1995  ? Dr.Emberlyn Burlison  ? VASECTOMY  09/25/1972  ? [facility].   ? ? ?Social History  ? ?Socioeconomic History  ? Marital status: Married  ?  Spouse name: Not on file  ? Number of children: 3  ? Years of education: Not on file  ? Highest education level: Not on file  ?Occupational History  ? Occupation: retired   ?Tobacco Use  ? Smoking status: Former  ?  Packs/day: 0.50  ?  Years: 16.00  ?  Pack years: 8.00  ?  Types: Cigarettes, Cigars  ?  Quit date: 01/18/2004  ?  Years since quitting: 17.9  ? Smokeless tobacco: Never  ?Vaping Use  ? Vaping Use: Never used  ?Substance and Sexual Activity  ? Alcohol use: Yes  ?  Alcohol/week: 1.0 standard drink  ?  Types: 1 Cans of beer per week  ?  Comment: 1 beer week  ? Drug use: No  ? Sexual activity: Not Currently  ?  Birth control/protection: None  ?Other Topics Concern  ? Not on file  ?Social History Narrative  ? Tobacco use, amount per day now: Non smoker for 16 years.  ? Past tobacco use, amount per day: 1/2 Pack of cigarettes a day or 3 cigars.   ? How many years did you use tobacco: 15 years.  ? Alcohol use (drinks per week): 0-2 drinks.  ? Diet: Normal.   ? Do you drink/eat things with caffeine: Yes.  ? Marital status:  Married                                What year were you married? 1981  ? Do you live in a house, apartment, assisted living, condo, trailer, etc.? House.  ? Is it one or more stories? 2 stories.   ? How many persons live in your home? 2  ? Do you have pets in your home?( please list)  Yes 2 dogs, 1 cat.  ? Highest Level of education completed: High School GED.  ? Current or past profession: Hydrologist.   ? Do you exercise? Not regularly.                                   ? Type and how often? Working outside on yard.   ? Do you have a  living will? Yes.  ? Do you have a DNR form?  Yes.                                 If not, do you want to discuss one?  ? Do you have signed POA/HPOA forms? Yes.                       If so, please bring to you appointment   ? Do you have any difficulty bathing or dressing yourself? No.   ? Do you have difficulty preparing food or eating? No.  ? Do you have difficulty managing your medications? No.  ? Do you have any difficulty managing your finances? No.   ? Do you have any difficulty affording your  medications? No.  ?    ? ?Social Determinants of Health  ? ?Financial Resource Strain: Not on file  ?Food Insecurity: Not on file  ?Transportation Needs: Not on file  ?Physical Activity: Not on file  ?Stress: Not on file  ?Social Connections: Not on file  ?Intimate Partner Violence: Not on file  ? ? ?Family History  ?Problem Relation Age of Onset  ? Healthy Mother   ? Hypertension Mother   ? Dementia Mother   ? Heart disease Father   ? Dementia Father   ? Heart failure Father   ? Heart disease Brother   ? Stroke Brother   ? Hypertension Son   ? Stroke Brother   ? ? ?Anti-infectives: ?Anti-infectives (From admission, onward)  ? ? None  ? ?  ? ? ?Current Outpatient Medications  ?Medication Sig Dispense Refill  ? acetaminophen (TYLENOL) 325 MG tablet Take 650 mg by mouth every 6 (six) hours as needed.    ? calcium carbonate (TUMS EX) 750 MG chewable tablet Chew 1 tablet by mouth as needed for heartburn.    ? diclofenac Sodium (VOLTAREN) 1 % GEL Apply 4 g topically 2 (two) times daily as needed. Apply to right knee and ankle    ? esomeprazole (NEXIUM) 40 MG capsule Take 1 capsule (40 mg total) by mouth daily at 12 noon. 30 capsule 2  ? Homeopathic Products (YEAST-GARD HOMEOPATHIC) GEL 1 application. as directed. Apply to genitals 3-4 times daily    ? MAGNESIUM PO Take 2 Doses by mouth at bedtime. 250 mg chewable total of 500 mg    ? melatonin 3 MG TABS tablet Take 3 mg by mouth at bedtime as needed.    ? meloxicam (MOBIC) 7.5 MG tablet Take 1 tablet (7.5 mg total) by mouth daily. 30 tablet 5  ? Menthol, Topical Analgesic, (BIOFREEZE) 4 % GEL Apply topically daily as needed.    ? sertraline (ZOLOFT) 25 MG tablet Take 1 tablet (25 mg total) by mouth daily. 30 tablet 1  ? vitamin B-12 (CYANOCOBALAMIN) 1000 MCG tablet Take 1,000 mcg by mouth daily.    ? ?No current facility-administered medications for this visit.  ? ? ? ?Objective: ?BP (!) 162/62   Pulse (!) 56  ? ? ?Physical Exam ?Vitals reviewed.  ?Constitutional:   ?    Appearance: Normal appearance.  ?Lymphadenopathy:  ?   Cervical: No cervical adenopathy.  ?   Upper Body:  ?   Right upper body: No supraclavicular adenopathy.  ?   Left upper body: No supraclavicular adenopat

## 2022-01-12 NOTE — Telephone Encounter (Signed)
LVM for patient to call back to schedule oct. Lab F/U already placed lab order. MLP ?

## 2022-01-12 NOTE — Telephone Encounter (Signed)
-----   Message from Lauree Chandler, NP sent at 01/12/2022 11:34 AM EDT ----- ?Can we go ahead and schedule him for a PSA lab in Oct 2023 ?----- Message ----- ?From: [name], MD ?Sent: 01/12/2022  10:58 AM EDT ?To: Lauree Chandler, NP ? ?Kevin [name]'s PSA is rising very slowly.  Can you get a repeat PSA on him in about 6 months and forward the results my way?  Thanks. ? ? ?

## 2022-01-27 ENCOUNTER — Other Ambulatory Visit: Payer: Self-pay

## 2022-01-27 DIAGNOSIS — Z8546 Personal history of malignant neoplasm of prostate: Secondary | ICD-10-CM

## 2022-01-30 ENCOUNTER — Ambulatory Visit (INDEPENDENT_AMBULATORY_CARE_PROVIDER_SITE_OTHER): Payer: Medicare HMO | Admitting: Nurse Practitioner

## 2022-01-30 ENCOUNTER — Encounter: Payer: Self-pay | Admitting: Nurse Practitioner

## 2022-01-30 VITALS — BP 130/70 | HR 53 | Temp 97.3°F | Ht 67.0 in

## 2022-01-30 DIAGNOSIS — F321 Major depressive disorder, single episode, moderate: Secondary | ICD-10-CM | POA: Diagnosis not present

## 2022-01-30 DIAGNOSIS — R69 Illness, unspecified: Secondary | ICD-10-CM | POA: Diagnosis not present

## 2022-01-30 DIAGNOSIS — Z8546 Personal history of malignant neoplasm of prostate: Secondary | ICD-10-CM

## 2022-01-30 DIAGNOSIS — R413 Other amnesia: Secondary | ICD-10-CM | POA: Diagnosis not present

## 2022-01-30 DIAGNOSIS — L989 Disorder of the skin and subcutaneous tissue, unspecified: Secondary | ICD-10-CM

## 2022-01-30 NOTE — Progress Notes (Signed)
? ? ?Careteam: ?Patient Care Team: ?Kevin Chandler, NP as PCP - General (Geriatric Medicine) ?[name], MD as Consulting Physician (Cardiology) ?[name], MD as Attending Physician (Urology) ? ?PLACE OF SERVICE:  ?[facility]  ?Advanced Directive information ?  ? ?No Known Allergies ? ?Chief Complaint  ?Patient presents with  ? Follow-up  ?  4 week follow up on depression.Patient states that depression seems to be ok.Patient states that zoloft gives him headache and has been taking one every other day. Patient would also like to have area on left ear looked at.  Taking medication every other day seems to help with headache.  ? ? ? ?HPI: Patient is a 86 y.o. male to follow up depression- he was started on zoloft.  ?Has had worsening headaches since he was on zoloft  ?He has now backed off zoloft to every other day.  ?Wife does not feel like he is tolerating medication very well.  ? ?Urologist request we follow up PSA in Oct 2023 ? ?Place on the top of his ear that hurts when he lays on that side.  ? ?Reports memory continues to worsen ? ?Review of Systems:  ?Review of Systems  ?Constitutional:  Negative for chills, fever and weight loss.  ?HENT:  Negative for tinnitus.   ?Respiratory:  Negative for cough, sputum production and shortness of breath.   ?Cardiovascular:  Negative for chest pain, palpitations and leg swelling.  ?Gastrointestinal:  Negative for abdominal pain, constipation, diarrhea and heartburn.  ?Genitourinary:  Negative for dysuria, frequency and urgency.  ?Skin:   ?     Painful area to left ear  ?Neurological:  Positive for headaches (wrose since on zoloft). Negative for dizziness.  ?Psychiatric/Behavioral:  Positive for depression (unchanged) and memory loss. The patient does not have insomnia.   ? ?Past Medical History:  ?Diagnosis Date  ? Anxiety disorder, unspecified   ? Arthritis   ? Cancer [facility])   ? prostate  ? Cataract   ? Edema   ? Erectile dysfunction   ? Glaucoma   ? History of  blood in urine   ? History of cataract surgery 09/26/2015  ? Both Eyes by Dr.Hunt.  ? History of colonoscopy 09/26/2007  ? Dr.LeBaur  ? Indigestion   ? Knee pain   ? Pain in unspecified hip   ? Prostate cancer ([place])   ? Urinary incontinence   ? ?Past Surgical History:  ?Procedure Laterality Date  ? CATARACT EXTRACTION W/PHACO Right 01/20/2016  ? Procedure: CATARACT EXTRACTION PHACO AND INTRAOCULAR LENS PLACEMENT (IOC);  Surgeon: Tonny Branch, MD;  Location: [facility];  Service: Ophthalmology;  Laterality: Right;  CDE 15.42  ? CATARACT EXTRACTION W/PHACO Left 03/06/2016  ? Procedure: CATARACT EXTRACTION PHACO AND INTRAOCULAR LENS PLACEMENT LEFT EYE;  CDE:  15.81;  Surgeon: Tonny Branch, MD;  Location: [facility];  Service: Ophthalmology;  Laterality: Left;  ? CHOLECYSTECTOMY  2011  ? COLONOSCOPY  07/03/2008  ? FRACTURE SURGERY  09/25/1966  ? [place] SURGERY  09/26/2007  ? Dr.Jenkins   ? HERNIA REPAIR Right 09/26/1991  ? Dr.Smith   ? PROSTATE SURGERY    ? cancer  ? SUPRAPUBIC PROSTATECTOMY  09/26/1995  ? Dr.Wrenn  ? VASECTOMY  09/25/1972  ? [facility]   ? ?Social History: ?  reports that he quit smoking about 18 years ago. His smoking use included cigarettes and cigars. He has a 8.00 pack-year smoking history. He has never used smokeless  tobacco. He reports current alcohol use of about 1.0 standard drink per week. He reports that he does not use drugs. ? ?Family History  ?Problem Relation Age of Onset  ? Healthy Mother   ? Hypertension Mother   ? Dementia Mother   ? Heart disease Father   ? Dementia Father   ? Heart failure Father   ? Heart disease Brother   ? Stroke Brother   ? Hypertension Son   ? Stroke Brother   ? ? ?Medications: ?Patient's Medications  ?New Prescriptions  ? No medications on file  ?Previous Medications  ? ACETAMINOPHEN (TYLENOL) 325 MG TABLET    Take 650 mg by mouth every 6 (six) hours as needed.  ? CALCIUM CARBONATE (TUMS EX) 750 MG CHEWABLE TABLET    Chew 1 tablet by  mouth as needed for heartburn.  ? DICLOFENAC SODIUM (VOLTAREN) 1 % GEL    Apply 4 g topically 2 (two) times daily as needed. Apply to right knee and ankle  ? ESOMEPRAZOLE (NEXIUM) 40 MG CAPSULE    Take 1 capsule (40 mg total) by mouth daily at 12 noon.  ? HOMEOPATHIC PRODUCTS (YEAST-GARD HOMEOPATHIC) GEL    1 application. as directed. Apply to genitals 3-4 times daily  ? MAGNESIUM PO    Take 2 Doses by mouth at bedtime. 250 mg chewable total of 500 mg  ? MELATONIN 3 MG TABS TABLET    Take 3 mg by mouth at bedtime as needed.  ? MELOXICAM (MOBIC) 7.5 MG TABLET    Take 1 tablet (7.5 mg total) by mouth daily.  ? MENTHOL, TOPICAL ANALGESIC, (BIOFREEZE) 4 % GEL    Apply topically daily as needed.  ? SERTRALINE (ZOLOFT) 25 MG TABLET    Take 1 tablet (25 mg total) by mouth daily.  ? VITAMIN B-12 (CYANOCOBALAMIN) 1000 MCG TABLET    Take 1,000 mcg by mouth daily.  ?Modified Medications  ? No medications on file  ?Discontinued Medications  ? No medications on file  ? ? ?Physical Exam: ? ?Vitals:  ? 01/30/22 1002  ?BP: 130/70  ?Pulse: (!) 53  ?Temp: (!) 97.3 ?F (36.3 ?C)  ?TempSrc: Temporal  ?SpO2: 98%  ?Height: '5\' 7"'$  (1.702 m)  ? ?Body mass index is 29.13 kg/m?. ?Wt Readings from Last 3 Encounters:  ?01/02/22 186 lb (84.4 kg)  ?12/07/21 185 lb 3.2 oz (84 kg)  ?04/21/21 177 lb (80.3 kg)  ? ? ?Physical Exam ?Constitutional:   ?   General: He is not in acute distress. ?   Appearance: He is well-developed. He is not diaphoretic.  ?HENT:  ?   Head: Normocephalic and atraumatic.  ?   Right Ear: External ear normal.  ?   Left Ear: External ear normal.  ?   Mouth/Throat:  ?   Pharynx: No oropharyngeal exudate.  ?Eyes:  ?   Conjunctiva/sclera: Conjunctivae normal.  ?   Pupils: Pupils are equal, round, and reactive to light.  ?Cardiovascular:  ?   Rate and Rhythm: Normal rate and regular rhythm.  ?   Heart sounds: Normal heart sounds.  ?Pulmonary:  ?   Effort: Pulmonary effort is normal.  ?   Breath sounds: Normal breath sounds.   ?Abdominal:  ?   General: Bowel sounds are normal.  ?   Palpations: Abdomen is soft.  ?Musculoskeletal:     ?   General: No tenderness.  ?   Cervical back: Normal range of motion and neck supple.  ?   Right  lower leg: No edema.  ?   Left lower leg: No edema.  ?Skin: ?   General: Skin is warm and dry.  ?   Findings: Lesion (to top of ear pinna) present.  ?Neurological:  ?   Mental Status: He is alert and oriented to person, place, and time. Mental status is at baseline.  ?Psychiatric:     ?   Mood and Affect: Mood normal.  ? ? ?Labs reviewed: ?Basic Metabolic Panel: ?Recent Labs  ?  03/18/21 ?1153 01/02/22 ?1039  ?NA 140 138  ?K 4.4 4.9  ?CL 105 102  ?CO2 27 31  ?GLUCOSE 98 97  ?BUN 15 21  ?CREATININE 1.17* 1.14  ?CALCIUM 9.0 9.4  ?TSH  --  1.46  ? ?Liver Function Tests: ?Recent Labs  ?  03/18/21 ?1153 01/02/22 ?1039  ?AST 20 19  ?ALT 18 18  ?BILITOT 0.7 1.1  ?PROT 6.5 6.8  ? ?No results for input(s): LIPASE, AMYLASE in the last 8760 hours. ?No results for input(s): AMMONIA in the last 8760 hours. ?CBC: ?Recent Labs  ?  03/18/21 ?1153 01/02/22 ?1039  ?WBC 6.3 5.9  ?NEUTROABS 3,774 3,339  ?HGB 15.2 16.2  ?HCT 45.4 47.3  ?MCV 90.1 87.6  ?PLT 175 198  ? ?Lipid Panel: ?Recent Labs  ?  03/18/21 ?1153  ?CHOL 189  ?HDL 53  ?LDLCALC 114*  ?TRIG 113  ?CHOLHDL 3.6  ? ?TSH: ?Recent Labs  ?  01/02/22 ?1039  ?TSH 1.46  ? ?A1C: ?No results found for: HGBA1C ? ? ?Assessment/Plan ?1. Skin lesion ?-nonhealing ara on top of pinna, causing discomfort- recommend dermatology evaluation at thi stime ?- Ambulatory referral to Dermatology ? ?2. Memory loss ?-ongoing, has CT scheduled later this month- plan to follow up visit after scan to discuss results and discuss starting aricept.  ? ?3. History of prostate cancer ?-continues to follow up with urology, will update PSA in october ? ?4. Current moderate episode of major depressive disorder without prior episode ([place]) ?-will stop zoloft due to side effects.  ?-consider Wellbutrin at follow  up  ? ? ?Return in about 5 months (around 07/02/2022) for routine follow up- and lab (PSA). ?Carlos American. Dewaine Oats, AGNP ? ?[facility] ?[phone]  ?

## 2022-01-30 NOTE — Patient Instructions (Addendum)
To stop zoloft.  ? ?Follow up after CT scan head- can be virtual- to discuss results.  ?

## 2022-02-13 ENCOUNTER — Ambulatory Visit
Admission: RE | Admit: 2022-02-13 | Discharge: 2022-02-13 | Disposition: A | Discharge status: Home / Self Care | Payer: Medicare HMO | Source: Ambulatory Visit | Attending: Nurse Practitioner | Admitting: Nurse Practitioner

## 2022-02-13 DIAGNOSIS — R413 Other amnesia: Secondary | ICD-10-CM

## 2022-02-23 DIAGNOSIS — L570 Actinic keratosis: Secondary | ICD-10-CM | POA: Diagnosis not present

## 2022-02-23 DIAGNOSIS — D225 Melanocytic nevi of trunk: Secondary | ICD-10-CM | POA: Diagnosis not present

## 2022-02-23 DIAGNOSIS — X32XXXA Exposure to sunlight, initial encounter: Secondary | ICD-10-CM | POA: Diagnosis not present

## 2022-03-01 ENCOUNTER — Encounter: Payer: Self-pay | Admitting: Nurse Practitioner

## 2022-03-02 ENCOUNTER — Telehealth: Payer: Self-pay

## 2022-03-02 ENCOUNTER — Encounter: Payer: Self-pay | Admitting: Nurse Practitioner

## 2022-03-02 ENCOUNTER — Ambulatory Visit (INDEPENDENT_AMBULATORY_CARE_PROVIDER_SITE_OTHER): Payer: Medicare HMO | Admitting: Nurse Practitioner

## 2022-03-02 DIAGNOSIS — Z0000 Encounter for general adult medical examination without abnormal findings: Secondary | ICD-10-CM

## 2022-03-02 NOTE — Progress Notes (Signed)
This service is provided via telemedicine  No vital signs collected/recorded due to the encounter was a telemedicine visit.   Location of patient (ex: home, work):  home  Patient consents to a telephone visit:  yes see consent dated 02/28/2021  Location of the provider (ex: office, home):  [facility]   Name of any referring provider:  n/a  Names of all persons participating in the telemedicine service and their role in the encounter:  patient and spouse, Earl Gala [facility], .Sherrie Mustache, NP  Time spent on call:  13 minutes

## 2022-03-02 NOTE — Patient Instructions (Signed)
Kevin [name] , Thank you for taking time to come for your Medicare Wellness Visit. I appreciate your ongoing commitment to your health goals. Please review the following plan we discussed and let me know if I can assist you in the future.   Screening recommendations/referrals: Colonoscopy aged out Recommended yearly ophthalmology/optometry visit for glaucoma screening and checkup Recommended yearly dental visit for hygiene and checkup  Vaccinations: Influenza vaccine due  Pneumococcal vaccine DUE- recommend to get at your local pharmacy or next office visit Tdap vaccine -DUE- recommend to get at your local pharmacy    Shingles vaccine -up to date    Advanced directives: on file.   Conditions/risks identified: advance age, sedentary lifestyle, progressive memory loss.   Next appointment: yearly   Preventive Care 15 Years and Older, Male Preventive care refers to lifestyle choices and visits with your health care provider that can promote health and wellness. What does preventive care include? A yearly physical exam. This is also called an annual well check. Dental exams once or twice a year. Routine eye exams. Ask your health care provider how often you should have your eyes checked. Personal lifestyle choices, including: Daily care of your teeth and gums. Regular physical activity. Eating a healthy diet. Avoiding tobacco and drug use. Limiting alcohol use. Practicing safe sex. Taking low doses of aspirin every day. Taking vitamin and mineral supplements as recommended by your health care provider. What happens during an annual well check? The services and screenings done by your health care provider during your annual well check will depend on your age, overall health, lifestyle risk factors, and family history of disease. Counseling  Your health care provider may ask you questions about your: Alcohol use. Tobacco use. Drug use. Emotional well-being. Home and relationship  well-being. Sexual activity. Eating habits. History of falls. Memory and ability to understand (cognition). Work and work Statistician. Screening  You may have the following tests or measurements: Height, weight, and BMI. Blood pressure. Lipid and cholesterol levels. These may be checked every 5 years, or more frequently if you are over 59 years old. Skin check. Lung cancer screening. You may have this screening every year starting at age 79 if you have a 30-pack-year history of smoking and currently smoke or have quit within the past 15 years. Fecal occult blood test (FOBT) of the stool. You may have this test every year starting at age 34. Flexible sigmoidoscopy or colonoscopy. You may have a sigmoidoscopy every 5 years or a colonoscopy every 10 years starting at age 19. Prostate cancer screening. Recommendations will vary depending on your family history and other risks. Hepatitis C blood test. Hepatitis B blood test. Sexually transmitted disease (STD) testing. Diabetes screening. This is done by checking your blood sugar (glucose) after you have not eaten for a while (fasting). You may have this done every 1-3 years. Abdominal aortic aneurysm (AAA) screening. You may need this if you are a current or former smoker. Osteoporosis. You may be screened starting at age 73 if you are at high risk. Talk with your health care provider about your test results, treatment options, and if necessary, the need for more tests. Vaccines  Your health care provider may recommend certain vaccines, such as: Influenza vaccine. This is recommended every year. Tetanus, diphtheria, and acellular pertussis (Tdap, Td) vaccine. You may need a Td booster every 10 years. Zoster vaccine. You may need this after age 75. Pneumococcal 13-valent conjugate (PCV13) vaccine. One dose is recommended after age 80.  Pneumococcal polysaccharide (PPSV23) vaccine. One dose is recommended after age 36. Talk to your health care  provider about which screenings and vaccines you need and how often you need them. This information is not intended to replace advice given to you by your health care provider. Make sure you discuss any questions you have with your health care provider. Document Released: 10/08/2015 Document Revised: 05/31/2016 Document Reviewed: 07/13/2015 Elsevier Interactive Patient Education  2017 [place] Prevention in the Home Falls can cause injuries. They can happen to people of all ages. There are many things you can do to make your home safe and to help prevent falls. What can I do on the outside of my home? Regularly fix the edges of walkways and driveways and fix any cracks. Remove anything that might make you trip as you walk through a door, such as a raised step or threshold. Trim any bushes or trees on the path to your home. Use bright outdoor lighting. Clear any walking paths of anything that might make someone trip, such as rocks or tools. Regularly check to see if handrails are loose or broken. Make sure that both sides of any steps have handrails. Any raised decks and porches should have guardrails on the edges. Have any leaves, snow, or ice cleared regularly. Use sand or salt on walking paths during winter. Clean up any spills in your garage right away. This includes oil or grease spills. What can I do in the bathroom? Use night lights. Install grab bars by the toilet and in the tub and shower. Do not use towel bars as grab bars. Use non-skid mats or decals in the tub or shower. If you need to sit down in the shower, use a plastic, non-slip stool. Keep the floor dry. Clean up any water that spills on the floor as soon as it happens. Remove soap buildup in the tub or shower regularly. Attach bath mats securely with double-sided non-slip rug tape. Do not have throw rugs and other things on the floor that can make you trip. What can I do in the bedroom? Use night lights. Make  sure that you have a light by your bed that is easy to reach. Do not use any sheets or blankets that are too big for your bed. They should not hang down onto the floor. Have a firm chair that has side arms. You can use this for support while you get dressed. Do not have throw rugs and other things on the floor that can make you trip. What can I do in the kitchen? Clean up any spills right away. Avoid walking on wet floors. Keep items that you use a lot in easy-to-reach places. If you need to reach something above you, use a strong step stool that has a grab bar. Keep electrical cords out of the way. Do not use floor polish or wax that makes floors slippery. If you must use wax, use non-skid floor wax. Do not have throw rugs and other things on the floor that can make you trip. What can I do with my stairs? Do not leave any items on the stairs. Make sure that there are handrails on both sides of the stairs and use them. Fix handrails that are broken or loose. Make sure that handrails are as long as the stairways. Check any carpeting to make sure that it is firmly attached to the stairs. Fix any carpet that is loose or worn. Avoid having throw rugs at the  top or bottom of the stairs. If you do have throw rugs, attach them to the floor with carpet tape. Make sure that you have a light switch at the top of the stairs and the bottom of the stairs. If you do not have them, ask someone to add them for you. What else can I do to help prevent falls? Wear shoes that: Do not have high heels. Have rubber bottoms. Are comfortable and fit you well. Are closed at the toe. Do not wear sandals. If you use a stepladder: Make sure that it is fully opened. Do not climb a closed stepladder. Make sure that both sides of the stepladder are locked into place. Ask someone to hold it for you, if possible. Clearly mark and make sure that you can see: Any grab bars or handrails. First and last steps. Where the  edge of each step is. Use tools that help you move around (mobility aids) if they are needed. These include: Canes. Walkers. Scooters. Crutches. Turn on the lights when you go into a dark area. Replace any light bulbs as soon as they burn out. Set up your furniture so you have a clear path. Avoid moving your furniture around. If any of your floors are uneven, fix them. If there are any pets around you, be aware of where they are. Review your medicines with your doctor. Some medicines can make you feel dizzy. This can increase your chance of falling. Ask your doctor what other things that you can do to help prevent falls. This information is not intended to replace advice given to you by your health care provider. Make sure you discuss any questions you have with your health care provider. Document Released: 07/08/2009 Document Revised: 02/17/2016 Document Reviewed: 10/16/2014 Elsevier Interactive Patient Education  2017 [employer].

## 2022-03-02 NOTE — Telephone Encounter (Signed)
This encounter was created in error - please disregard.

## 2022-03-02 NOTE — Telephone Encounter (Signed)
Kevin [name], Kevin [name] are scheduled for a virtual visit with your provider today.    Just as we do with appointments in the office, we must obtain your consent to participate.  Your consent will be active for this visit and any virtual visit you may have with one of our providers in the next 365 days.    If you have a MyChart account, I can also send a copy of this consent to you electronically.  All virtual visits are billed to your insurance company just like a traditional visit in the office.  As this is a virtual visit, video technology does not allow for your provider to perform a traditional examination.  This may limit your provider's ability to fully assess your condition.  If your provider identifies any concerns that need to be evaluated in person or the need to arrange testing such as labs, EKG, etc, we will make arrangements to do so.    Although advances in technology are sophisticated, we cannot ensure that it will always work on either your end or our end.  If the connection with a video visit is poor, we may have to switch to a telephone visit.  With either a video or telephone visit, we are not always able to ensure that we have a secure connection.   I need to obtain your verbal consent now.   Are you willing to proceed with your visit today?   Kevin [name] has provided verbal consent on 03/02/2022 for a virtual visit (video or telephone).   [name], CMA 03/02/2022  10:58 AM

## 2022-03-02 NOTE — Progress Notes (Signed)
Subjective:   Kevin [name] is a 86 y.o. male who presents for Medicare Annual/Subsequent preventive examination.  Review of Systems     Cardiac Risk Factors include: advanced age (>61mn, >>73women);sedentary lifestyle;male gender     Objective:    There were no vitals filed for this visit. There is no height or weight on file to calculate BMI.     03/02/2022    9:51 AM 01/02/2022    9:25 AM 12/07/2021    1:02 PM 09/12/2021    2:13 PM 03/18/2021   11:26 AM 02/28/2021    9:58 AM 10/25/2020    1:29 PM  Advanced Directives  Does Patient Have a Medical Advance Directive? Yes Yes Yes Yes Yes Yes Yes  Type of Advance Directive Living will Living will Living will Living will Living will Living will Living will  Does patient want to make changes to medical advance directive? No - Patient declined No - Patient declined No - Patient declined No - Patient declined No - Patient declined No - Patient declined No - Patient declined    Current Medications (verified) Outpatient Encounter Medications as of 03/02/2022  Medication Sig   acetaminophen (TYLENOL) 325 MG tablet Take 650 mg by mouth every 6 (six) hours as needed.   calcium carbonate (TUMS EX) 750 MG chewable tablet Chew 1 tablet by mouth as needed for heartburn.   diclofenac Sodium (VOLTAREN) 1 % GEL Apply 4 g topically 2 (two) times daily as needed. Apply to right knee and ankle   esomeprazole (NEXIUM) 40 MG capsule Take 1 capsule (40 mg total) by mouth daily at 12 noon.   Homeopathic Products (YEAST-GARD HOMEOPATHIC) GEL 1 application. as directed. Apply to genitals 3-4 times daily   MAGNESIUM PO Take 2 Doses by mouth at bedtime. 250 mg chewable total of 500 mg   melatonin 3 MG TABS tablet Take 3 mg by mouth at bedtime as needed.   meloxicam (MOBIC) 7.5 MG tablet Take 1 tablet (7.5 mg total) by mouth daily.   Menthol, Topical Analgesic, (BIOFREEZE) 4 % GEL Apply topically daily as needed.   vitamin B-12 (CYANOCOBALAMIN) 1000 MCG  tablet Take 1,000 mcg by mouth daily.   No facility-administered encounter medications on file as of 03/02/2022.    Allergies (verified) Patient has no known allergies.   History: Past Medical History:  Diagnosis Date   Anxiety disorder, unspecified    Arthritis    Cancer ([facility]    prostate   Cataract    Edema    Erectile dysfunction    Glaucoma    History of blood in urine    History of cataract surgery 09/26/2015   Both Eyes by Dr.Hunt.   History of colonoscopy 09/26/2007   Dr.LeBaur   Indigestion    Knee pain    Pain in unspecified hip    Prostate cancer ([facility]    Urinary incontinence    Past Surgical History:  Procedure Laterality Date   CATARACT EXTRACTION W/PHACO Right 01/20/2016   Procedure: CATARACT EXTRACTION PHACO AND INTRAOCULAR LENS PLACEMENT (IOC);  Surgeon: KTonny Branch MD;  Location: [facility];  Service: Ophthalmology;  Laterality: Right;  CDE 15.42   CATARACT EXTRACTION W/PHACO Left 03/06/2016   Procedure: CATARACT EXTRACTION PHACO AND INTRAOCULAR LENS PLACEMENT LEFT EYE;  CDE:  15.81;  Surgeon: KTonny Branch MD;  Location: [facility];  Service: Ophthalmology;  Laterality: Left;   CHOLECYSTECTOMY  2011   COLONOSCOPY  07/03/2008   FRACTURE SURGERY  09/25/1966  [place]  09/26/2007   Dr.Jenkins    HERNIA REPAIR Right 09/26/1991   Dr.Smith    PROSTATE SURGERY     cancer   SUPRAPUBIC PROSTATECTOMY  09/26/1995   Dr.Wrenn   VASECTOMY  09/25/1972   [facility]    Family History  Problem Relation Age of Onset   Healthy Mother    Hypertension Mother    Dementia Mother    Heart disease Father    Dementia Father    Heart failure Father    Heart disease Brother    Stroke Brother    Hypertension Son    Stroke Brother    Social History   Socioeconomic History   Marital status: Married    Spouse name: Not on file   Number of children: 3   Years of education: Not on file   Highest education level: Not on file   Occupational History   Occupation: retired  Tobacco Use   Smoking status: Former    Packs/day: 0.50    Years: 16.00    Total pack years: 8.00    Types: Cigarettes, Cigars    Quit date: 01/18/2004    Years since quitting: 18.1   Smokeless tobacco: Never  Vaping Use   Vaping Use: Never used  Substance and Sexual Activity   Alcohol use: Yes    Alcohol/week: 1.0 standard drink of alcohol    Types: 1 Cans of beer per week    Comment: 1 beer week   Drug use: No   Sexual activity: Not Currently    Birth control/protection: None  Other Topics Concern   Not on file  Social History Narrative   Tobacco use, amount per day now: Non smoker for 16 years.   Past tobacco use, amount per day: 1/2 Pack of cigarettes a day or 3 cigars.    How many years did you use tobacco: 15 years.   Alcohol use (drinks per week): 0-2 drinks.   Diet: Normal.    Do you drink/eat things with caffeine: Yes.   Marital status:  Married                                What year were you married? 1981   Do you live in a house, apartment, assisted living, condo, trailer, etc.? House.   Is it one or more stories? 2 stories.    How many persons live in your home? 2   Do you have pets in your home?( please list)  Yes 2 dogs, 1 cat.   Highest Level of education completed: High School GED.   Current or past profession: Hydrologist.    Do you exercise? Not regularly.                                    Type and how often? Working outside on yard.    Do you have a living will? Yes.   Do you have a DNR form?  Yes.                                 If not, do you want to discuss one?   Do you have signed POA/HPOA forms? Yes.  If so, please bring to you appointment    Do you have any difficulty bathing or dressing yourself? No.    Do you have difficulty preparing food or eating? No.   Do you have difficulty managing your medications? No.   Do you have any difficulty managing your  finances? No.    Do you have any difficulty affording your medications? No.       Social Determinants of Health   Financial Resource Strain: Not on file  Food Insecurity: Not on file  Transportation Needs: Not on file  Physical Activity: Not on file  Stress: Not on file  Social Connections: Not on file    Tobacco Counseling Counseling given: Not Answered   Clinical Intake:  Pre-visit preparation completed: Yes  Pain : No/denies pain     BMI - recorded: 29 Nutritional Status: BMI 25 -29 Overweight Diabetes: No  How often do you need to have someone help you when you read instructions, pamphlets, or other written materials from your doctor or pharmacy?: 3 - Sometimes  Diabetic?no         Activities of Daily Living    03/02/2022   11:19 AM  In your present state of health, do you have any difficulty performing the following activities:  Hearing? 0  Vision? 0  Difficulty concentrating or making decisions? 1  Walking or climbing stairs? 1  Comment due to arthritis  Dressing or bathing? 0  Doing errands, shopping? 1  Comment wife does this  Conservation officer, nature and eating ? N  Using the Toilet? N  In the past six months, have you accidently leaked urine? Y  Do you have problems with loss of bowel control? N  Managing your Medications? Y  Managing your Finances? N  Housekeeping or managing your Housekeeping? Y    Patient Care Team: Lauree Chandler, NP as PCP - General (Geriatric Medicine) [name], MD as Consulting Physician (Cardiology) [name], MD as Attending Physician (Urology)  Indicate any recent Medical Services you may have received from other than [facility] providers in the past year (date may be approximate).     Assessment:   This is a routine wellness examination for [place].  Hearing/Vision screen Hearing Screening - Comments:: Does not currently use any hearing aids Vision Screening - Comments:: Have not had eye exam this year-My eye  doctor/ [place]  Dietary issues and exercise activities discussed: Current Exercise Habits: The patient does not participate in regular exercise at present   Goals Addressed   None    Depression Screen    03/02/2022   10:48 AM 01/30/2022    9:58 AM 02/28/2021    9:51 AM 02/25/2020   10:24 AM 02/13/2020    1:12 PM  PHQ 2/9 Scores  PHQ - 2 Score 0 4 4 0 0  PHQ- 9 Score 0 12 16      Fall Risk    03/02/2022   10:43 AM 12/07/2021    1:02 PM 09/12/2021    2:13 PM 03/18/2021   11:26 AM 02/28/2021    9:50 AM  Fall Risk   Falls in the past year? 0 0 0 0 0  Number falls in past yr: 0 0 0 0 0  Injury with Fall? 0 0 0 0 0  Risk for fall due to : No Fall Risks No Fall Risks No Fall Risks No Fall Risks   Follow up Falls evaluation completed Falls evaluation completed;Education provided Falls evaluation completed Falls evaluation completed  FALL RISK PREVENTION PERTAINING TO THE HOME:  Any stairs in or around the home? Yes  If so, are there any without handrails? No  Home free of loose throw rugs in walkways, pet beds, electrical cords, etc? Yes  Adequate lighting in your home to reduce risk of falls? Yes   ASSISTIVE DEVICES UTILIZED TO PREVENT FALLS:  Life alert? No  Use of a cane, walker or w/c? Yes  Grab bars in the bathroom? Yes  Shower chair or bench in shower? No  Elevated toilet seat or a handicapped toilet? No   TIMED UP AND GO:  Was the test performed? No .   Cognitive Function:        03/02/2022   10:48 AM 02/28/2021    9:59 AM 02/25/2020   10:09 AM  6CIT Screen  What Year? 0 points 0 points 0 points  What month? 0 points 0 points 0 points  What time? 0 points 0 points 0 points  Count back from 20 0 points 0 points 0 points  Months in reverse 0 points 0 points 0 points  Repeat phrase 2 points 8 points 0 points  Total Score 2 points 8 points 0 points    Immunizations Immunization History  Administered Date(s) Administered   Influenza Split 07/06/2016,  07/03/2017, 05/27/2019   Influenza, High Dose Seasonal PF 07/20/2020   Influenza-Unspecified 09/25/2018   Moderna Covid-19 Vaccine Bivalent Booster 88yr & up 11/05/2021   Moderna SARS-COV2 Booster Vaccination 07/22/2020, 05/09/2021   Moderna Sars-Covid-2 Vaccination 10/31/2019, 11/29/2019   Pneumococcal Polysaccharide-23 01/02/2017   Zoster Recombinat (Shingrix) 03/21/2021, 12/21/2021    TDAP status: Due, Education has been provided regarding the importance of this vaccine. Advised may receive this vaccine at local pharmacy or Health Dept. Aware to provide a copy of the vaccination record if obtained from local pharmacy or Health Dept. Verbalized acceptance and understanding.  Flu Vaccine status: Up to date  Pneumococcal vaccine status: Due, Education has been provided regarding the importance of this vaccine. Advised may receive this vaccine at local pharmacy or Health Dept. Aware to provide a copy of the vaccination record if obtained from local pharmacy or Health Dept. Verbalized acceptance and understanding.  Covid-19 vaccine status: Completed vaccines  Qualifies for Shingles Vaccine? Yes   Zostavax completed Yes   Shingrix Completed?: Yes  Screening Tests Health Maintenance  Topic Date Due   TETANUS/TDAP  Never done   Pneumonia Vaccine 86 Years old (2 - PCV) 01/02/2018   COVID-19 Vaccine (4 - Booster for Moderna series) 12/31/2021   INFLUENZA VACCINE  04/25/2022   Zoster Vaccines- Shingrix  Completed   HPV VACCINES  Aged Out    Health Maintenance  Health Maintenance Due  Topic Date Due   TETANUS/TDAP  Never done   Pneumonia Vaccine 86 Years old (2 - PCV) 01/02/2018   COVID-19 Vaccine (4 - Booster for Moderna series) 12/31/2021    Colorectal cancer screening: No longer required.   Lung Cancer Screening: (Low Dose CT Chest recommended if Age 84-80years, 30 pack-year currently smoking OR have quit w/in 15years.) does not qualify.   Lung Cancer Screening Referral:  na  Additional Screening:  Hepatitis C Screening: does not qualify  Vision Screening: Recommended annual ophthalmology exams for early detection of glaucoma and other disorders of the eye. Is the patient up to date with their annual eye exam?  Yes  Who is the provider or what is the name of the office in which the patient attends annual eye  exams? My eye doctor in [place] If pt is not established with a provider, would they like to be referred to a provider to establish care? No .   Dental Screening: Recommended annual dental exams for proper oral hygiene  Community Resource Referral / Chronic Care Management: CRR required this visit?  No   CCM required this visit?  No      Plan:     I have personally reviewed and noted the following in the patient's chart:   Medical and social history Use of alcohol, tobacco or illicit drugs  Current medications and supplements including opioid prescriptions. Patient is not currently taking opioid prescriptions. Functional ability and status Nutritional status Physical activity Advanced directives List of other physicians Hospitalizations, surgeries, and ER visits in previous 12 months Vitals Screenings to include cognitive, depression, and falls Referrals and appointments  In addition, I have reviewed and discussed with patient certain preventive protocols, quality metrics, and best practice recommendations. A written personalized care plan for preventive services as well as general preventive health recommendations were provided to patient.     Lauree Chandler, NP   03/02/2022    Virtual Visit via Telephone Note  I connected with patient 03/02/22 at 11:00 AM EDT by telephone and verified that I am speaking with the correct person using two identifiers.  Location: Patient: home [facility]   I discussed the limitations, risks, security and privacy concerns of performing an evaluation and management service by  telephone and the availability of in person appointments. I also discussed with the patient that there may be a patient responsible charge related to this service. The patient expressed understanding and agreed to proceed.   I discussed the assessment and treatment plan with the patient. The patient was provided an opportunity to ask questions and all were answered. The patient agreed with the plan and demonstrated an understanding of the instructions.   The patient was advised to call back or seek an in-person evaluation if the symptoms worsen or if the condition fails to improve as anticipated.  I provided 16 minutes of non-face-to-face time during this encounter.  Carlos American. Harle Battiest Avs printed and mailed

## 2022-03-02 NOTE — Telephone Encounter (Signed)
[name], tsuchiya are scheduled for a virtual visit with your provider today.    Just as we do with appointments in the office, we must obtain your consent to participate.  Your consent will be active for this visit and any virtual visit you may have with one of our providers in the next 365 days.    If you have a MyChart account, I can also send a copy of this consent to you electronically.  All virtual visits are billed to your insurance company just like a traditional visit in the office.  As this is a virtual visit, video technology does not allow for your provider to perform a traditional examination.  This may limit your provider's ability to fully assess your condition.  If your provider identifies any concerns that need to be evaluated in person or the need to arrange testing such as labs, EKG, etc, we will make arrangements to do so.    Although advances in technology are sophisticated, we cannot ensure that it will always work on either your end or our end.  If the connection with a video visit is poor, we may have to switch to a telephone visit.  With either a video or telephone visit, we are not always able to ensure that we have a secure connection.   I need to obtain your verbal consent now.   Are you willing to proceed with your visit today?   Kevin [name] has provided verbal consent on 03/02/2022 for a virtual visit (video or telephone).   [name], [name] 03/02/2022  11:12 AM

## 2022-03-17 ENCOUNTER — Encounter: Payer: Self-pay | Admitting: Physician Assistant

## 2022-03-25 ENCOUNTER — Other Ambulatory Visit: Payer: Self-pay | Admitting: Nurse Practitioner

## 2022-03-25 DIAGNOSIS — K219 Gastro-esophageal reflux disease without esophagitis: Secondary | ICD-10-CM

## 2022-03-25 DIAGNOSIS — R1310 Dysphagia, unspecified: Secondary | ICD-10-CM

## 2022-04-11 NOTE — Progress Notes (Addendum)
04/13/2022 Kevin [name] [id number] 11-23-1935  Referring provider: Lauree Chandler, NP Primary GI doctor: Dr. Carlean Purl  ASSESSMENT AND PLAN:   Dysphagia, unspecified type with GERD -     esomeprazole (NEXIUM) 40 MG capsule; Take 1 capsule (40 mg total) by mouth 2 (two) times daily before a meal. -Had unremarkable MBS 2021 -Patient is on Mobic, thinks he would benefit from endoscopy.  History of adenomatous polyp of colon with worsening constipation Chronic idiopathic constipation -     CBC with Differential/Platelet; Future -     Comprehensive metabolic panel; Future -     TSH; Future -     High sensitivity CRP; Future -     DG Abd 1 View; Future Likely constipation has to do with decreased movement, medications.  Continue Colace MiraLAX given information. Patient is overdue for colonoscopy.  Patient has relatively good health other than walking with cane and short-term memory. After discussing endoscopy and colonoscopy with him and his wife, Kevin [name]. Planning on doing some traveling upcoming would like to schedule the colonoscopy and endoscopy closer to November.  He also is still slightly uncertain about undergoing anesthesia. Offered possible virtual CT/barium swallow but at this time agreed to call back end of August and potentially schedule.  We will send reminder to nurse. In the meantime increase PPI to twice daily  GI attending:  At his age we may need stronger indications for colonoscopy though I see he is relatively fit.  He should have an office visit prior to scheduling any procedures.  I would probably do a barium swallow before an EGD was done as well.  Gatha Mayer, MD, [facility]   Patient Care Team: Lauree Chandler, NP as PCP - General (Geriatric Medicine) [name], MD as Consulting Physician (Cardiology) [name], MD as Attending Physician (Urology)  HISTORY OF PRESENT ILLNESS: 86 y.o. male with a past medical history of history of  prostate cancer status post prostatectomy 1997, urinary incontinence, anxiety and others listed below presents for evaluation of dysphagia.  Patient has history of cholecystectomy, hernia repair right inguinal  07/03/2008 colonoscopy with Dr. Carlean Purl with adenomatous polyps suggested 3-year follow-up. 01/02/2022 CBC showed no anemia no leukocytosis hemoglobin 16.2 Patient was lost to follow-up, currently presenting the office with dysphagia  Wife, Kevin [name], is here with him and provides most of the history.  States short term memory is not as good, has worse over the past year.  He states if he eats red meat he will have stomach with nausea and vomiting.  He has been having trouble swallowing for at least a year.  03/09/2020 MBS with no observed pharyngeal dysphagia.  He has GERD with burping and beltching.  Has been on nexium 40 mg x 4-6 months, which is not helping per wife.  He denies any recent hematochezia, last time was years ago.  Has had some melena at times, no iron/pepto.  Has Mobic on medication list, 7.5 mg x close to year, currently still on that. . Was taking PRN aleve with the mobic, but has not taken in the last 3 months.  He has been having constipation for year, he is on colace once a day occ twice which helps.  Has BM every day since being on it, has been on apple juice.  Has been very hard/formed stools with straining. Improved with meds.  Patient has had weight gain, not loss.   Current Medications:      Current Outpatient Medications (  Analgesics):    acetaminophen (TYLENOL) 325 MG tablet, Take 650 mg by mouth every 6 (six) hours as needed.   meloxicam (MOBIC) 7.5 MG tablet, Take 1 tablet (7.5 mg total) by mouth daily.  Current Outpatient Medications (Hematological):    vitamin B-12 (CYANOCOBALAMIN) 1000 MCG tablet, Take 1,000 mcg by mouth daily.  Current Outpatient Medications (Other):    calcium carbonate (TUMS EX) 750 MG chewable tablet, Chew 1 tablet by  mouth as needed for heartburn.   diclofenac Sodium (VOLTAREN) 1 % GEL, Apply 4 g topically 2 (two) times daily as needed. Apply to right knee and ankle   Homeopathic Products (YEAST-GARD HOMEOPATHIC) GEL, 1 application. as directed. Apply to genitals 3-4 times daily   MAGNESIUM PO, Take 2 Doses by mouth at bedtime. 250 mg chewable total of 500 mg   melatonin 3 MG TABS tablet, Take 3 mg by mouth at bedtime as needed.   Menthol, Topical Analgesic, (BIOFREEZE) 4 % GEL, Apply topically daily as needed.   esomeprazole (NEXIUM) 40 MG capsule, Take 1 capsule (40 mg total) by mouth 2 (two) times daily before a meal.  Medical History:  Past Medical History:  Diagnosis Date   Anxiety disorder, unspecified    Arthritis    Cancer ([place])    prostate   Cataract    Edema    Erectile dysfunction    Glaucoma    History of blood in urine    History of cataract surgery 09/26/2015   Both Eyes by Dr.Hunt.   History of colonoscopy 09/26/2007   Dr.LeBaur   Indigestion    Knee pain    Pain in unspecified hip    Prostate cancer [facility])    Urinary incontinence    Allergies: No Known Allergies   Surgical History:  He  has a past surgical history that includes Suprapubic prostatectomy (09/26/1995); Cholecystectomy (2011); Cataract extraction w/PHACO (Right, 01/20/2016); Cataract extraction w/PHACO (Left, 03/06/2016); Colonoscopy (07/03/2008); Prostate surgery; Fracture surgery (09/25/1966); Vasectomy (09/25/1972); Hernia repair (Right, 09/26/1991); and Gallbladder surgery (09/26/2007). Family History:  His family history includes Dementia in his father and mother; Healthy in his mother; Heart disease in his brother and father; Heart failure in his father; Hypertension in his mother and son; Stroke in his brother and brother. Social History:   reports that he quit smoking about 18 years ago. His smoking use included cigarettes and cigars. He has a 8.00 pack-year smoking history. He has never used smokeless  tobacco. He reports current alcohol use of about 1.0 standard drink of alcohol per week. He reports that he does not use drugs.  REVIEW OF SYSTEMS  : All other systems reviewed and negative except where noted in the History of Present Illness.   PHYSICAL EXAM: BP 110/60   Pulse (!) 49   Ht '5\' 7"'$  (1.702 m)   Wt 184 lb 3.2 oz (83.6 kg)   BMI 28.85 kg/m  General:   Pleasant, well developed elderly appearing male in no acute distress Head:   Normocephalic and atraumatic. Eyes:  sclerae anicteric,conjunctive pink  Heart:   regular rate and rhythm Pulm:  Clear anteriorly; no wheezing Abdomen:   Soft, Obese AB, Sluggish bowel sounds. mild tenderness in the LLQ. Without guarding and Without rebound, No organomegaly appreciated. Rectal: Not evaluated Extremities:  Without edema. Msk: Symmetrical without gross deformities, walks with cane.  Peripheral pulses intact.  Neurologic:  Alert and  oriented x4;  No focal deficits.  Skin:   Dry and intact without significant lesions or  rashes. Psychiatric:  Cooperative. Normal mood and affect.    Vladimir Crofts, PA-C 3:33 PM

## 2022-04-13 ENCOUNTER — Ambulatory Visit (INDEPENDENT_AMBULATORY_CARE_PROVIDER_SITE_OTHER)
Admission: RE | Admit: 2022-04-13 | Discharge: 2022-04-13 | Disposition: A | Discharge status: Home / Self Care | Payer: Medicare HMO | Source: Ambulatory Visit | Attending: Physician Assistant | Admitting: Physician Assistant

## 2022-04-13 ENCOUNTER — Other Ambulatory Visit (INDEPENDENT_AMBULATORY_CARE_PROVIDER_SITE_OTHER): Payer: Medicare HMO

## 2022-04-13 ENCOUNTER — Encounter: Payer: Self-pay | Admitting: Physician Assistant

## 2022-04-13 ENCOUNTER — Ambulatory Visit: Payer: Medicare HMO | Admitting: Physician Assistant

## 2022-04-13 VITALS — BP 110/60 | HR 49 | Ht 67.0 in | Wt 184.2 lb

## 2022-04-13 DIAGNOSIS — R1310 Dysphagia, unspecified: Secondary | ICD-10-CM

## 2022-04-13 DIAGNOSIS — K219 Gastro-esophageal reflux disease without esophagitis: Secondary | ICD-10-CM | POA: Diagnosis not present

## 2022-04-13 DIAGNOSIS — K5904 Chronic idiopathic constipation: Secondary | ICD-10-CM

## 2022-04-13 DIAGNOSIS — Z86010 Personal history of colonic polyps: Secondary | ICD-10-CM | POA: Diagnosis not present

## 2022-04-13 DIAGNOSIS — K56609 Unspecified intestinal obstruction, unspecified as to partial versus complete obstruction: Secondary | ICD-10-CM | POA: Diagnosis not present

## 2022-04-13 LAB — COMPREHENSIVE METABOLIC PANEL
ALT: 22 U/L (ref 0–53)
AST: 21 U/L (ref 0–37)
Albumin: 4.5 g/dL (ref 3.5–5.2)
Alkaline Phosphatase: 66 U/L (ref 39–117)
BUN: 17 mg/dL (ref 6–23)
CO2: 30 mEq/L (ref 19–32)
Calcium: 9.3 mg/dL (ref 8.4–10.5)
Chloride: 101 mEq/L (ref 96–112)
Creatinine, Ser: 1.16 mg/dL (ref 0.40–1.50)
GFR: 58.15 mL/min — ABNORMAL LOW (ref >60.00)
Glucose, Bld: 89 mg/dL (ref 70–99)
Potassium: 4.6 mEq/L (ref 3.5–5.1)
Sodium: 139 mEq/L (ref 135–145)
Total Bilirubin: 1 mg/dL (ref 0.2–1.2)
Total Protein: 7 g/dL (ref 6.0–8.3)

## 2022-04-13 LAB — CBC WITH DIFFERENTIAL/PLATELET
Basophils Absolute: 0.1 10*3/uL (ref 0.0–0.1)
Basophils Relative: 1 % (ref 0.0–3.0)
Eosinophils Absolute: 0 10*3/uL (ref 0.0–0.7)
Eosinophils Relative: 0.6 % (ref 0.0–5.0)
HCT: 44.5 % (ref 39.0–52.0)
Hemoglobin: 15.2 g/dL (ref 13.0–17.0)
Lymphocytes Relative: 27.7 % (ref 12.0–46.0)
Lymphs Abs: 1.9 10*3/uL (ref 0.7–4.0)
MCHC: 34.1 g/dL (ref 30.0–36.0)
MCV: 87.8 fl (ref 78.0–100.0)
Monocytes Absolute: 0.5 10*3/uL (ref 0.1–1.0)
Monocytes Relative: 7.8 % (ref 3.0–12.0)
Neutro Abs: 4.3 10*3/uL (ref 1.4–7.7)
Neutrophils Relative %: 62.9 % (ref 43.0–77.0)
Platelets: 177 10*3/uL (ref 150.0–400.0)
RBC: 5.07 Mil/uL (ref 4.22–5.81)
RDW: 13.4 % (ref 11.5–15.5)
WBC: 6.8 10*3/uL (ref 4.0–10.5)

## 2022-04-13 LAB — TSH: TSH: 1.22 u[IU]/mL (ref 0.35–5.50)

## 2022-04-13 LAB — HIGH SENSITIVITY CRP: CRP, High Sensitivity: 1.01 mg/L (ref 0.000–5.000)

## 2022-04-13 MED ORDER — ESOMEPRAZOLE MAGNESIUM 40 MG PO CPDR: 40.0000 mg | DELAYED_RELEASE_CAPSULE | Freq: Two times a day (BID) | ORAL | 2 refills | Status: DC

## 2022-04-13 NOTE — Patient Instructions (Addendum)
If you are age 84 or older, your body mass index should be between 23-30. Your Body mass index is 28.85 kg/m. If this is out of the aforementioned range listed, please consider follow up with your Primary Care Provider. ________________________________________________________  The [facility] providers would like to encourage you to use [facility] to communicate with providers for non-urgent requests or questions.  Due to long hold times on the telephone, sending your provider a message by [facility] may be a faster and more efficient way to get a response.  Please allow 48 business hours for a response.  Please remember that this is for non-urgent requests.  _______________________________________________________   Your provider has requested that you go to the basement level for lab work and Abdominal X-ray before leaving today. Press "B" on the elevator. The lab is located at the first door on the left as you exit the elevator. Imaging is located at the straight across from the elevator.   Please take your proton pump inhibitor medication, nexium twice a day for 6- 8 weeks  Please take this medication 30 minutes to 1 hour before meals- this makes it more effective.  Avoid spicy and acidic foods Avoid fatty foods Limit your intake of coffee, tea, alcohol, and carbonated drinks Work to maintain a healthy weight Keep the head of the bed elevated at least 3 inches with blocks or a wedge pillow if you are having any nighttime symptoms Stay upright for 2 hours after eating Avoid meals and snacks three to four hours before bedtime   Dysphagia precautions:  1. Take reflux medications 30+ minutes before food in the morning 2. Begin meals with warm beverage 3. Eat smaller more frequent meals 4. Eat slowly, taking small bites and sips 5. Alternate solids and liquids 6. Avoid foods/liquids that increase acid production 7. Sit upright during and for 30+ minutes after meals to facilitate esophageal  clearing 8. All meats should be chopped finely.   If something gets hung in your esophagus and will not come up or go down, proceed to the emergency room.    Miralax is an osmotic laxative.  It only brings more water into the stool.  This is safe to take daily.  Can take up to 17 gram of miralax twice a day.  Mix with juice or coffee.  Start 1 capful at night for 3-4 days and reassess your response in 3-4 days.  You can increase and decrease the dose based on your response.  Remember, it can take up to 3-4 days to take effect OR for the effects to wear off.  I often pair this with benefiber in the morning to help assure the stool is not too loose.   Recommend starting on a fiber supplement, can try metamucil first but if this causes gas/bloating switch to benefiber or citracel, these do not cause gas.  Take with fiber with with a full 8 oz glass of water once a day. This can take 1 month to start helping, so try for at least one month.  Recommend increasing water and physical activity.   - Drink at least 64-80 ounces of water/liquid per day. - Establish a time to try to move your bowels every day.  For many people, this is after a cup of coffee or after a meal such as breakfast. - Sit all of the way back on the toilet keeping your back fairly straight and while sitting up, try to rest the tops of your forearms on your  upper thighs.   - Raising your feet with a step stool/squatty potty can be helpful to improve the angle that allows your stool to pass through the rectum. - Relax the rectum feeling it bulge toward the toilet water.  If you feel your rectum raising toward your body, you are contracting rather than relaxing. - Breathe in and slowly exhale. "Belly breath" by expanding your belly towards your belly button. Keep belly expanded as you gently direct pressure down and back to the anus.  A low pitched GRRR sound can assist with increasing intra-abdominal pressure.  - Repeat 3-4 times.  If unsuccessful, contract the pelvic floor to restore normal tone and get off the toilet.  Avoid excessive straining. - To reduce excessive wiping by teaching your anus to normally contract, place hands on outer aspect of knees and resist knee movement outward.  Hold 5-10 second then place hands just inside of knees and resist inward movement of knees.  Hold 5 seconds.  Repeat a few times each way.  Go to the ER if unable to pass gas, severe AB pain, unable to hold down food, any shortness of breath of chest pain.  It has been recommended to you by your physician that you have a(n) Endoscopy completed. Per your request, we did not schedule the procedure(s) today. Please contact our office at [phone] should you decide to have the procedure completed. You will be scheduled for a pre-visit and procedure at that time.  Thank you for entrusting me with your care and choosing [facility].  Vicie Mutters, PA-C

## 2022-04-17 ENCOUNTER — Telehealth: Payer: Self-pay

## 2022-04-17 NOTE — Telephone Encounter (Signed)
Left message for patient to call back  

## 2022-04-17 NOTE — Telephone Encounter (Signed)
 Vladimir Crofts, PA-C  , Salvadore Dom, RN  Please make OV prior to scheduling, ideally with Dr. Carlean Purl but can be with me.

## 2022-04-18 NOTE — Telephone Encounter (Signed)
Left message for patient to call back  

## 2022-04-19 NOTE — Telephone Encounter (Signed)
Inbound call from patients spouse stating they will call back to schedule OV for late September due to patient not wanting any procedures done until mid November. Patients spouse states please give a call to advise it needed.   Thank you

## 2022-04-19 NOTE — Telephone Encounter (Signed)
Unable to reach patient x 3. Sent patient mychart message.

## 2022-04-20 ENCOUNTER — Telehealth: Payer: Self-pay

## 2022-04-20 ENCOUNTER — Other Ambulatory Visit (HOSPITAL_COMMUNITY): Payer: Self-pay

## 2022-04-20 NOTE — Telephone Encounter (Signed)
Patient Advocate Encounter   Received notification from CoverMyMeds that prior authorization is required for Esomeprazole '40MG'$  (twice daily dosing).  Key [id number] Submitted: 04/21/2023 Status is pending  Clista Bernhardt, CPhT Pharmacy Patient Advocate Specialist Phone: [phone]

## 2022-04-20 NOTE — Telephone Encounter (Signed)
Patient Advocate Encounter  Prior Authorization for Esomeprazole '40MG'$  has been approved.   Effective: 09/25/2021 to 09/24/2022.  Clista Bernhardt, CPhT Rx Patient Advocate Specialist Phone: [phone]

## 2022-07-03 ENCOUNTER — Encounter: Payer: Self-pay | Admitting: Nurse Practitioner

## 2022-07-03 ENCOUNTER — Other Ambulatory Visit: Payer: Self-pay | Admitting: Nurse Practitioner

## 2022-07-03 ENCOUNTER — Ambulatory Visit (INDEPENDENT_AMBULATORY_CARE_PROVIDER_SITE_OTHER): Payer: Medicare HMO | Admitting: Nurse Practitioner

## 2022-07-03 VITALS — BP 128/68 | HR 60 | Temp 96.2°F | Ht 67.0 in | Wt 181.4 lb

## 2022-07-03 DIAGNOSIS — H9312 Tinnitus, left ear: Secondary | ICD-10-CM

## 2022-07-03 DIAGNOSIS — M159 Polyosteoarthritis, unspecified: Secondary | ICD-10-CM | POA: Diagnosis not present

## 2022-07-03 DIAGNOSIS — I10 Essential (primary) hypertension: Secondary | ICD-10-CM

## 2022-07-03 DIAGNOSIS — R69 Illness, unspecified: Secondary | ICD-10-CM | POA: Diagnosis not present

## 2022-07-03 DIAGNOSIS — R519 Headache, unspecified: Secondary | ICD-10-CM

## 2022-07-03 DIAGNOSIS — R413 Other amnesia: Secondary | ICD-10-CM | POA: Diagnosis not present

## 2022-07-03 DIAGNOSIS — Z23 Encounter for immunization: Secondary | ICD-10-CM | POA: Diagnosis not present

## 2022-07-03 DIAGNOSIS — Z8546 Personal history of malignant neoplasm of prostate: Secondary | ICD-10-CM | POA: Diagnosis not present

## 2022-07-03 DIAGNOSIS — F321 Major depressive disorder, single episode, moderate: Secondary | ICD-10-CM

## 2022-07-03 DIAGNOSIS — E538 Deficiency of other specified B group vitamins: Secondary | ICD-10-CM | POA: Diagnosis not present

## 2022-07-03 DIAGNOSIS — G4700 Insomnia, unspecified: Secondary | ICD-10-CM | POA: Diagnosis not present

## 2022-07-03 DIAGNOSIS — K219 Gastro-esophageal reflux disease without esophagitis: Secondary | ICD-10-CM | POA: Diagnosis not present

## 2022-07-03 DIAGNOSIS — R1310 Dysphagia, unspecified: Secondary | ICD-10-CM

## 2022-07-03 DIAGNOSIS — K5900 Constipation, unspecified: Secondary | ICD-10-CM

## 2022-07-03 DIAGNOSIS — G8929 Other chronic pain: Secondary | ICD-10-CM

## 2022-07-03 MED ORDER — DONEPEZIL HCL 5 MG PO TABS: ORAL_TABLET | ORAL | 1 refills | Status: DC

## 2022-07-03 NOTE — Progress Notes (Signed)
Careteam: Patient Care Team: Lauree Chandler, NP as PCP - General (Geriatric Medicine) [name], MD as Consulting Physician (Cardiology) [name], MD as Attending Physician (Urology)  PLACE OF SERVICE:  [place] Directive information    No Known Allergies  Chief Complaint  Patient presents with   Medical Management of Chronic Issues    Patient presents today for a 5 month follow-up   Quality Metric Gaps    TDAP,pneumonia, COVID#4     HPI: Patient is a 86 y.o. male for routine follow up   Continues to have intermittent sudden headaches still. Will take tylenol which helps . CT scan negative for acute findings.  It does show chronic microvascular changes which we discussed could lead to memory loss. States short-term memory is leaving. Discussed adding an agent to slow the progression of the loss of memory.   Has also noted been having vision changes, says it's like seeing a spider web. Has been going on for about a couple of years. But now episodes are getting worse and takes longer for it to go away. He has been trying to make an appointment with eye doctor, but has been having a hard time getting in.    Also reports ringing in the ear that has been ongoing, notes this on left side with vision changes and headache.   Indigestion is better on nexium.   Sleeping is intermittent, sometimes has good nights, sometimes will have bad nights. Remains on melatonin.   Goes to the bathroom about twice a night. Problems starting the flow comes and goes. Due to go back to urology in February. PSA has been rising.  Bowels are moving fine with miralax,  but still strains. Has recently started an OTC stool softener.   Continues to have problems with swallowing, especially with pills. Due to go back to gastroenterologist in November for endoscopy. Recommended crushing his medicine and mixing with applesauce.  States mood is ok, just a decrease in patience.  Depression symptoms are improved.   Saw dermatologist for the spot on his ear. The dermatologist froze it, and he is due to go back at the first of the year.     Review of Systems:  Review of Systems  Constitutional:  Positive for malaise/fatigue. Negative for fever and weight loss.  HENT:  Positive for hearing loss and tinnitus. Negative for sore throat.   Eyes:        Impaired vision, sees "sider webs"  Respiratory:  Negative for cough and shortness of breath.   Cardiovascular:  Negative for chest pain and leg swelling.  Gastrointestinal:  Positive for heartburn. Negative for abdominal pain, constipation, diarrhea and nausea.       Trouble swallowing   Genitourinary:  Positive for frequency. Negative for dysuria, hematuria and urgency.  Musculoskeletal:  Positive for joint pain. Negative for falls.  Skin:  Negative for itching and rash.  Neurological:  Positive for headaches. Negative for dizziness, tingling, tremors, speech change, focal weakness, loss of consciousness and weakness.  Psychiatric/Behavioral:  Positive for memory loss. Negative for depression. The patient has insomnia. The patient is not nervous/anxious.     Past Medical History:  Diagnosis Date   Anxiety disorder, unspecified    Arthritis    Cancer ([place])    prostate   Cataract    Edema    Erectile dysfunction    Glaucoma    History of blood in urine    History of cataract surgery  09/26/2015   Both Eyes by Dr.Hunt.   History of colonoscopy 09/26/2007   Dr.LeBaur   Indigestion    Knee pain    Pain in unspecified hip    Prostate cancer [facility])    Urinary incontinence    Past Surgical History:  Procedure Laterality Date   CATARACT EXTRACTION W/PHACO Right 01/20/2016   Procedure: CATARACT EXTRACTION PHACO AND INTRAOCULAR LENS PLACEMENT (IOC);  Surgeon: Tonny Branch, MD;  Location: [facility];  Service: Ophthalmology;  Laterality: Right;  CDE 15.42   CATARACT EXTRACTION W/PHACO Left 03/06/2016   Procedure: CATARACT  EXTRACTION PHACO AND INTRAOCULAR LENS PLACEMENT LEFT EYE;  CDE:  15.81;  Surgeon: Tonny Branch, MD;  Location: [facility];  Service: Ophthalmology;  Laterality: Left;   CHOLECYSTECTOMY  2011   COLONOSCOPY  07/03/2008   FRACTURE SURGERY  09/25/1966   [place] SURGERY  09/26/2007   Dr.Jenkins    HERNIA REPAIR Right 09/26/1991   Dr.Smith    PROSTATE SURGERY     cancer   SUPRAPUBIC PROSTATECTOMY  09/26/1995   Dr.Wrenn   VASECTOMY  09/25/1972   [facility].    Social History:   reports that he quit smoking about 18 years ago. His smoking use included cigarettes and cigars. He has a 8.00 pack-year smoking history. He has never used smokeless tobacco. He reports current alcohol use of about 1.0 standard drink of alcohol per week. He reports that he does not use drugs.  Family History  Problem Relation Age of Onset   Healthy Mother    Hypertension Mother    Dementia Mother    Heart disease Father    Dementia Father    Heart failure Father    Heart disease Brother    Stroke Brother    Hypertension Son    Stroke Brother     Medications: Patient's Medications  New Prescriptions   No medications on file  Previous Medications   ACETAMINOPHEN (TYLENOL) 325 MG TABLET    Take 650 mg by mouth every 6 (six) hours as needed.   CALCIUM CARBONATE (TUMS EX) 750 MG CHEWABLE TABLET    Chew 1 tablet by mouth as needed for heartburn.   DICLOFENAC SODIUM (VOLTAREN) 1 % GEL    Apply 4 g topically 2 (two) times daily as needed. Apply to right knee and ankle   ESOMEPRAZOLE (NEXIUM) 40 MG CAPSULE    Take 1 capsule (40 mg total) by mouth 2 (two) times daily before a meal.   HOMEOPATHIC PRODUCTS ([employer] HOMEOPATHIC) GEL    1 application. as directed. Apply to genitals 3-4 times daily   MAGNESIUM PO    Take 2 Doses by mouth at bedtime. 250 mg chewable total of 500 mg   MELATONIN 3 MG TABS TABLET    Take 3 mg by mouth at bedtime as needed.   MENTHOL, TOPICAL ANALGESIC,  (BIOFREEZE) 4 % GEL    Apply topically daily as needed.   VITAMIN B-12 (CYANOCOBALAMIN) 1000 MCG TABLET    Take 1,000 mcg by mouth daily.  Modified Medications   No medications on file  Discontinued Medications   MELOXICAM (MOBIC) 7.5 MG TABLET    Take 1 tablet (7.5 mg total) by mouth daily.    Physical Exam:  Vitals:   07/03/22 0841  BP: 128/68  Pulse: 60  Temp: (!) 96.2 F (35.7 C)  SpO2: 98%  Weight: 82.3 kg  Height: '5\' 7"'$  (1.702 m)   Body mass index is 28.41  kg/m. Wt Readings from Last 3 Encounters:  07/03/22 82.3 kg  04/13/22 83.6 kg  01/02/22 84.4 kg    Physical Exam Constitutional:      General: He is not in acute distress. HENT:     Right Ear: Tympanic membrane, ear canal and external ear normal. There is no impacted cerumen.     Left Ear: Tympanic membrane, ear canal and external ear normal. There is no impacted cerumen.     Nose: No rhinorrhea.     Mouth/Throat:     Mouth: Mucous membranes are moist.     Pharynx: Oropharynx is clear.  Eyes:     Pupils: Pupils are equal, round, and reactive to light.  Cardiovascular:     Rate and Rhythm: Normal rate and regular rhythm.     Pulses: Normal pulses.     Heart sounds: Normal heart sounds. No murmur heard. Pulmonary:     Effort: Pulmonary effort is normal. No respiratory distress.     Breath sounds: Normal breath sounds. No wheezing.  Abdominal:     General: Bowel sounds are normal. There is no distension.     Palpations: Abdomen is soft.     Tenderness: There is no abdominal tenderness.  Musculoskeletal:     Right lower leg: No edema.     Left lower leg: No edema.     Comments: Ambulates with cane   Skin:    General: Skin is warm and dry.  Neurological:     General: No focal deficit present.     Mental Status: He is alert and oriented to person, place, and time. Mental status is at baseline.     Motor: No weakness.     Gait: Gait abnormal.     Comments: Ambulates with cane   Psychiatric:        Mood  and Affect: Mood normal.        Behavior: Behavior normal.     Labs reviewed: Basic Metabolic Panel: Recent Labs    01/02/22 1039 04/13/22 1455  NA 138 139  K 4.9 4.6  CL 102 101  CO2 31 30  GLUCOSE 97 89  BUN 21 17  CREATININE 1.14 1.16  CALCIUM 9.4 9.3  TSH 1.46 1.22   Liver Function Tests: Recent Labs    01/02/22 1039 04/13/22 1455  AST 19 21  ALT 18 22  ALKPHOS  --  66  BILITOT 1.1 1.0  PROT 6.8 7.0  ALBUMIN  --  4.5   No results for input(s): "LIPASE", "AMYLASE" in the last 8760 hours. No results for input(s): "AMMONIA" in the last 8760 hours. CBC: Recent Labs    01/02/22 1039 04/13/22 1455  WBC 5.9 6.8  NEUTROABS 3,339 4.3  HGB 16.2 15.2  HCT 47.3 44.5  MCV 87.6 87.8  PLT 198 177.0   Lipid Panel: No results for input(s): "CHOL", "HDL", "LDLCALC", "TRIG", "CHOLHDL", "LDLDIRECT" in the last 8760 hours. TSH: Recent Labs    01/02/22 1039 04/13/22 1455  TSH 1.46 1.22   A1C: No results found for: "HGBA1C"   Assessment/Plan 1. Memory loss - Start Aricept. Discussed that this is a medication that will help to slow down the progression of memory loss but will not prevent it, not will it help improve memory that has already been loss.  - donepezil (ARICEPT) 5 MG tablet; 5 mg by mouth daily at bedtime for 1 month then increase to 2 tablets 10 mg daily at bedtime by mouth.  Dispense: 60 tablet; Refill:  1  2. Need for influenza vaccination - Received in office today  - Flu Vaccine QUAD High Dose(Fluad)  3. Tinnitus of left ear -referral to neurology for further evaluation due to tinnitus, headache, imbalance and visual changes.   4. Chronic nonintractable headache, unspecified headache type - has a cluster of symptoms including tinnitus, headaches, memory loss, impaired vision, gait imbalance.  -continue to use tylenol PRN which helps symptoms.  - Ambulatory referral to Neurology  5. Osteoarthritis of multiple joints, unspecified osteoarthritis  type - Controlled  - Continue Voltaren PRN  6. Dysphagia, unspecified type - follows with GI - discussed eating slowly, crushing medications, sticking to foods that are easy to chew and swallow.   7. B12 Deficiency  - continue B-12 supplement  8. Current moderate episode of major depressive disorder without prior episode ([place]) - stable without medications - no re-occurring episode   9. Insomnia, unspecified type - continue melatonin  10. Gastroesophageal reflux disease without esophagitis - stable -continue nexium and TUMS  11. Essential hypertension - controlled - not on any antihypertensive medications.   12. History of prostate cancer - follows with urology   13. Constipation - encouraged hydration and increasing fiber in diet - continue miralax, can take BID if needed  - continue OTC stool softener   Return in about 6 months (around 01/02/2023) for routine follow up . Will need prevnar 20 at followup- wanted to wait on vaccine until then.   Student- Waunita Schooner, RN I personally was present during the history, physical exam and medical decision-making activities of this service and have verified that the service and findings are accurately documented in the student's note Lief Palmatier K. Iota, [facility] [phone]

## 2022-07-04 ENCOUNTER — Encounter: Payer: Self-pay | Admitting: Neurology

## 2022-07-04 LAB — PSA: PSA: 0.54 ng/mL (ref <=4.00)

## 2022-07-07 ENCOUNTER — Other Ambulatory Visit: Payer: Medicare HMO

## 2022-08-25 ENCOUNTER — Other Ambulatory Visit: Payer: Self-pay | Admitting: Physician Assistant

## 2022-08-25 DIAGNOSIS — K219 Gastro-esophageal reflux disease without esophagitis: Secondary | ICD-10-CM

## 2022-08-25 DIAGNOSIS — R1310 Dysphagia, unspecified: Secondary | ICD-10-CM

## 2022-08-30 ENCOUNTER — Other Ambulatory Visit: Payer: Self-pay | Admitting: Nurse Practitioner

## 2022-08-30 DIAGNOSIS — R413 Other amnesia: Secondary | ICD-10-CM

## 2022-08-30 NOTE — Telephone Encounter (Signed)
Pharmacy requested refill

## 2022-11-22 NOTE — Progress Notes (Signed)
NEUROLOGY CONSULTATION NOTE  [name] MRN: [id number] DOB: [birth year]  Referring provider: Sherrie Mustache, NP Primary care provider: Sherrie Mustache, NP  Reason for consult:  headache  Assessment/Plan:   Mild neurocognitive disorder.  Unclear etiology - may be cerebrovascular or related to low B12 but cannot rule out underlying Alzheimer's Episodic tension type headache, not intractable Episodic visual disturbance - consider ocular migraines   Check MRI of brain without contrast Recheck B12 level For headaches, start gabapentin '100mg'$  at bedtime Would monitor memory for now. Limit use of pain relievers to no more than 2 days out of week to prevent risk of rebound or medication-overuse headache. Follow up 6 months.   Subjective:  Kevin [name] is an 87 year old male with arthritis, anxiety, cataract, memory deficits and history of prostate cancer who presents for headaches.  History supplemented by his accompanying wife and referring provider's note.  He reports short-term memory problems for about a year and progressively getting worse.  He will often walk into a room and forget what he wanted to get.  He may forget recent conversations with people.  Sometimes he may forget the name of people he knows.  He is independent.  He doesn't drive much but only locally and without disorientation.  He pays the bills without difficulty.  He performs all activities of daily living independently.  Denies formed hallucinations.  Denies nightmares but sometimes wakes up irritated by a dream. His father had some memory problems but was never diagnosed with dementia. TSH 1.22, non-reactive RPR, B12 273. He was advised to start OTC B12 1034mg daily which he took for awhile but then stopped.  He was prescribed donepezil but stopped it himself because he thought it interfered with his sleeping.    For several years, he has had headaches.  They are usually left frontal (sometimes spreads  to bilateral) 5/10 pressure pain.  Associated photophobia but no nausea, vomiting, phonophobia, or visual disturbance.  Lasts about an hour with Tylenol.  Occurs 2 to 3 days a week.    For a few years, he also has had recurrent episodes of visual disturbance.  It usually occurs while watching TV.  He will suddenly see a dark pattern (looks like a hand) rotating in the left half of his visual field in both eyes.  Typically lasts up to 30 minutes.  No associated headache.  Occurs once a week.  He has history of cataract surgery.  Has not had an eye exam in several years.      PAST MEDICAL HISTORY: Past Medical History:  Diagnosis Date   Anxiety disorder, unspecified    Arthritis    [place]    [place]   Cataract    Edema    Erectile dysfunction    Glaucoma    History of blood in urine    History of cataract surgery 09/26/2015   Both Eyes by Dr.Hunt.   History of colonoscopy 09/26/2007   Dr.LeBaur   Indigestion    Knee pain    Pain in unspecified hip    Prostate [facility] ([facility]    Urinary incontinence     PAST SURGICAL HISTORY: Past Surgical History:  Procedure Laterality Date   CATARACT EXTRACTION W/PHACO Right 01/20/2016   Procedure: CATARACT EXTRACTION PHACO AND INTRAOCULAR LENS PLACEMENT (IOC);  Surgeon: KTonny Branch MD;  Location: [facility];  Service: Ophthalmology;  Laterality: Right;  CDE 15.42   CATARACT EXTRACTION W/PHACO Left 03/06/2016   Procedure: CATARACT EXTRACTION PHACO  AND INTRAOCULAR LENS PLACEMENT LEFT EYE;  CDE:  15.81;  Surgeon: Tonny Branch, MD;  Location: [facility];  Service: Ophthalmology;  Laterality: Left;   CHOLECYSTECTOMY  2011   COLONOSCOPY  07/03/2008   FRACTURE SURGERY  09/25/1966   [place] SURGERY  09/26/2007   Dr.Jenkins    HERNIA REPAIR Right 09/26/1991   Dr.Smith    PROSTATE SURGERY     cancer   SUPRAPUBIC PROSTATECTOMY  09/26/1995   Dr.Wrenn   VASECTOMY  09/25/1972   [place]: Current  Outpatient Medications on File Prior to Visit  Medication Sig Dispense Refill   acetaminophen (TYLENOL) 325 MG tablet Take 650 mg by mouth every 6 (six) hours as needed.     calcium carbonate (TUMS EX) 750 MG chewable tablet Chew 1 tablet by mouth as needed for heartburn.     diclofenac Sodium (VOLTAREN) 1 % GEL Apply 4 g topically 2 (two) times daily as needed. Apply to right knee and ankle     esomeprazole (NEXIUM) 40 MG capsule Take 1 capsule (40 mg total) by mouth 2 (two) times daily before a meal. 60 capsule 0   MAGNESIUM PO Take 2 Doses by mouth at bedtime. 250 mg chewable total of 500 mg     Menthol, Topical Analgesic, (BIOFREEZE) 4 % GEL Apply topically daily as needed.     vitamin B-12 (CYANOCOBALAMIN) 1000 MCG tablet Take 1,000 mcg by mouth daily.     donepezil (ARICEPT) 10 MG tablet Take one tablet by mouth once daily at bedtime. (Patient not taking: Reported on 11/24/2022) 90 tablet 0   No current facility-administered medications on file prior to visit.     ALLERGIES: No Known Allergies  FAMILY HISTORY: Family History  Problem Relation Age of Onset   Healthy Mother    Hypertension Mother    Dementia Mother    Heart disease Father    Dementia Father    Heart failure Father    Heart disease Brother    Stroke Brother    Hypertension Son    Stroke Brother     Objective:  Blood pressure (!) 144/65, pulse (!) 52, height '5\' 9"'$  (1.753 m), weight 181 lb (82.1 kg), SpO2 99 %. General: No acute distress.  Patient appears well-groomed.   Head:  Normocephalic/atraumatic Eyes:  fundi examined but not visualized Neck: supple, no paraspinal tenderness, full range of motion Heart: regular rate and rhythm Neurological Exam: Mental status: alert and oriented to person, place, and time, speech fluent and not dysarthric    11/24/2022   11:00 AM  Montreal Cognitive Assessment   Visuospatial/ Executive (0/5) 4  Naming (0/3) 1  Attention: Read list of digits (0/2) 2  Attention: Read  list of letters (0/1) 0  Attention: Serial 7 subtraction starting at 100 (0/3) 3  Language: Repeat phrase (0/2) 2  Language : Fluency (0/1) 0  Abstraction (0/2) 0  Delayed Recall (0/5) 0  Orientation (0/6) 4  Total 16  Adjusted Score (based on education) 17   Cranial nerves: CN I: not tested CN II: pupils equal, round and reactive to light, visual fields intact CN III, IV, VI:  full range of motion, no nystagmus, no ptosis CN V: facial sensation intact. CN VII: upper and lower face symmetric CN VIII: hearing intact CN IX, X: gag intact, uvula midline CN XI: sternocleidomastoid and trapezius muscles intact CN XII: tongue midline Bulk & Tone: normal, no fasciculations. Motor:  muscle strength 5/5 throughout Sensation: Temperature and vibratory sensation intact. Deep Tendon Reflexes:  2+ throughout,  toes downgoing.   Finger to nose testing:  Without dysmetria.   Gait:  Cautious gait.  Uses cane.      Thank you for allowing me to take part in the care of this patient.  Metta Clines, DO  CC: Sherrie Mustache, NP

## 2022-11-23 ENCOUNTER — Encounter: Payer: Self-pay | Admitting: Radiology

## 2022-11-24 ENCOUNTER — Other Ambulatory Visit (INDEPENDENT_AMBULATORY_CARE_PROVIDER_SITE_OTHER): Payer: Medicare HMO

## 2022-11-24 ENCOUNTER — Ambulatory Visit: Payer: Medicare HMO | Admitting: Neurology

## 2022-11-24 ENCOUNTER — Encounter: Payer: Self-pay | Admitting: Neurology

## 2022-11-24 VITALS — BP 144/65 | HR 52 | Ht 69.0 in | Wt 181.0 lb

## 2022-11-24 DIAGNOSIS — I679 Cerebrovascular disease, unspecified: Secondary | ICD-10-CM

## 2022-11-24 DIAGNOSIS — G44219 Episodic tension-type headache, not intractable: Secondary | ICD-10-CM

## 2022-11-24 DIAGNOSIS — H539 Unspecified visual disturbance: Secondary | ICD-10-CM | POA: Diagnosis not present

## 2022-11-24 DIAGNOSIS — G3184 Mild cognitive impairment, so stated: Secondary | ICD-10-CM

## 2022-11-24 LAB — VITAMIN B12: Vitamin B-12: 589 pg/mL (ref 211–911)

## 2022-11-24 MED ORDER — GABAPENTIN 100 MG PO CAPS: 100.0000 mg | ORAL_CAPSULE | Freq: Every day | ORAL | 5 refills | Status: DC

## 2022-11-24 NOTE — Patient Instructions (Signed)
For headaches, start gabapentin '100mg'$  at bedtime.  If no improvement in 4 weeks, contact me and we can increase dose Check MRI of brain Check B12 Limit use of pain relievers to no more than 2 days out of week to prevent risk of rebound or medication-overuse headache. Follow up 6 months.

## 2022-11-28 NOTE — Progress Notes (Signed)
LMOVM for patient to call the office back.

## 2022-11-30 NOTE — Progress Notes (Signed)
LMOVM for patient, Please call the office back.

## 2022-12-13 ENCOUNTER — Encounter: Payer: Self-pay | Admitting: Neurology

## 2022-12-24 ENCOUNTER — Ambulatory Visit
Admission: RE | Admit: 2022-12-24 | Discharge: 2022-12-24 | Disposition: A | Discharge status: Home / Self Care | Payer: Medicare HMO | Source: Ambulatory Visit | Attending: Neurology | Admitting: Neurology

## 2022-12-24 DIAGNOSIS — G3184 Mild cognitive impairment, so stated: Secondary | ICD-10-CM

## 2022-12-24 DIAGNOSIS — I679 Cerebrovascular disease, unspecified: Secondary | ICD-10-CM

## 2022-12-24 DIAGNOSIS — R413 Other amnesia: Secondary | ICD-10-CM | POA: Diagnosis not present

## 2022-12-27 NOTE — Progress Notes (Signed)
LMOVM for patient to call back.

## 2023-01-01 ENCOUNTER — Encounter: Payer: Self-pay | Admitting: Nurse Practitioner

## 2023-01-01 ENCOUNTER — Ambulatory Visit (INDEPENDENT_AMBULATORY_CARE_PROVIDER_SITE_OTHER): Payer: Medicare HMO | Admitting: Nurse Practitioner

## 2023-01-01 VITALS — BP 118/70 | HR 48 | Temp 97.6°F | Ht 69.0 in | Wt 182.6 lb

## 2023-01-01 DIAGNOSIS — G8929 Other chronic pain: Secondary | ICD-10-CM

## 2023-01-01 DIAGNOSIS — I10 Essential (primary) hypertension: Secondary | ICD-10-CM | POA: Diagnosis not present

## 2023-01-01 DIAGNOSIS — Z23 Encounter for immunization: Secondary | ICD-10-CM

## 2023-01-01 DIAGNOSIS — M170 Bilateral primary osteoarthritis of knee: Secondary | ICD-10-CM

## 2023-01-01 DIAGNOSIS — Z8546 Personal history of malignant neoplasm of prostate: Secondary | ICD-10-CM

## 2023-01-01 DIAGNOSIS — K5900 Constipation, unspecified: Secondary | ICD-10-CM | POA: Diagnosis not present

## 2023-01-01 DIAGNOSIS — K219 Gastro-esophageal reflux disease without esophagitis: Secondary | ICD-10-CM

## 2023-01-01 DIAGNOSIS — R413 Other amnesia: Secondary | ICD-10-CM | POA: Diagnosis not present

## 2023-01-01 DIAGNOSIS — R001 Bradycardia, unspecified: Secondary | ICD-10-CM | POA: Diagnosis not present

## 2023-01-01 DIAGNOSIS — R519 Headache, unspecified: Secondary | ICD-10-CM

## 2023-01-01 LAB — CBC WITH DIFFERENTIAL/PLATELET
Hemoglobin: 15.2 g/dL (ref 13.2–17.1)
MCH: 30.1 pg (ref 27.0–33.0)
MCHC: 34.2 g/dL (ref 32.0–36.0)
Monocytes Relative: 8.7 %
Neutrophils Relative %: 59 %
RDW: 12.5 % (ref 11.0–15.0)
WBC: 5.9 10*3/uL (ref 3.8–10.8)

## 2023-01-01 LAB — COMPLETE METABOLIC PANEL WITH GFR
AG Ratio: 2 (calc) (ref 1.0–2.5)
Albumin: 4.3 g/dL (ref 3.6–5.1)
BUN: 18 mg/dL (ref 7–25)
Calcium: 8.9 mg/dL (ref 8.6–10.3)
Chloride: 106 mmol/L (ref 98–110)
Glucose, Bld: 92 mg/dL (ref 65–99)
Total Bilirubin: 1 mg/dL (ref 0.2–1.2)

## 2023-01-01 NOTE — Progress Notes (Signed)
Careteam: Patient Care Team: Sharon Seller, NP as PCP - General (Geriatric Medicine) Wendall Stade, MD as Consulting Physician (Cardiology) Bjorn Pippin, MD as Attending Physician (Urology)  PLACE OF SERVICE:  [facility]  Advanced Directive information    No Known Allergies  Chief Complaint  Patient presents with   Medical Management of Chronic Issues    6 month follow-up and EKG. Discuss need for td/tdap, pneumonia, and covid vaccines or pot pone if patient refuses or is not a candidate. [facility] verified.      HPI: Patient is a 87 y.o. male for follow up.   Continues to follow up with neurology for progressive memory loss and headaches.  Wife noted progressive decline in memory Gabapentin does help with the headaches.   Bowels moving better on metamucil.   Hx of bradycardia- not on any medication that could be contributing. No htn.   Constipation- well controlled on increase in fiber.   GERD_ controlled on nexium.   Hx of prostate cancer- followed by urologist. Has a hard time initiating a stream but this has been ongoing.   Having increase in pain to bilateral knees- some instability/tenderness.  Uses voltaren gel and magnesium butter.   Review of Systems:  Review of Systems  Constitutional:  Negative for chills, fever and weight loss.  HENT:  Negative for tinnitus.   Respiratory:  Negative for cough, sputum production and shortness of breath.   Cardiovascular:  Negative for chest pain, palpitations and leg swelling.  Gastrointestinal:  Negative for abdominal pain, constipation, diarrhea and heartburn.  Genitourinary:  Negative for dysuria, frequency and urgency.  Musculoskeletal:  Negative for back pain, falls, joint pain and myalgias.  Skin: Negative.   Neurological:  Negative for dizziness and headaches.  Psychiatric/Behavioral:  Negative for depression and memory loss. The patient does not have insomnia.     Past Medical History:  Diagnosis Date    Anxiety disorder, unspecified    Arthritis    Cancer    prostate   Cataract    Edema    Erectile dysfunction    Glaucoma    History of blood in urine    History of cataract surgery 09/26/2015   Both Eyes by Dr.Hunt.   History of colonoscopy 09/26/2007   Dr.LeBaur   Indigestion    Knee pain    Pain in unspecified hip    Prostate cancer    Urinary incontinence    Past Surgical History:  Procedure Laterality Date   CATARACT EXTRACTION W/PHACO Right 01/20/2016   Procedure: CATARACT EXTRACTION PHACO AND INTRAOCULAR LENS PLACEMENT (IOC);  Surgeon: Gemma Payor, MD;  Location: [facility];  Service: Ophthalmology;  Laterality: Right;  CDE 15.42   CATARACT EXTRACTION W/PHACO Left 03/06/2016   Procedure: CATARACT EXTRACTION PHACO AND INTRAOCULAR LENS PLACEMENT LEFT EYE;  CDE:  15.81;  Surgeon: Gemma Payor, MD;  Location: [facility];  Service: Ophthalmology;  Laterality: Left;   CHOLECYSTECTOMY  2011   COLONOSCOPY  07/03/2008   FRACTURE SURGERY  09/25/1966   [facility].    GALLBLADDER SURGERY  09/26/2007   Dr.Jenkins    HERNIA REPAIR Right 09/26/1991   Dr.Smith    PROSTATE SURGERY     cancer   SUPRAPUBIC PROSTATECTOMY  09/26/1995   Dr.Wrenn   VASECTOMY  09/25/1972   [facility].    Social History:   reports that he quit smoking about 18 years ago. His smoking use included cigarettes and cigars. He has a 8.00 pack-year  smoking history. He has never used smokeless tobacco. He reports current alcohol use of about 1.0 standard drink of alcohol per week. He reports that he does not use drugs.  Family History  Problem Relation Age of Onset   Healthy Mother    Hypertension Mother    Dementia Mother    Heart disease Father    Dementia Father    Heart failure Father    Heart disease Brother    Stroke Brother    Hypertension Son    Stroke Brother     Medications: Patient's Medications  New Prescriptions   No medications on file  Previous Medications   ACETAMINOPHEN  (TYLENOL) 325 MG TABLET    Take 650 mg by mouth every 6 (six) hours as needed.   CALCIUM CARBONATE (TUMS EX) 750 MG CHEWABLE TABLET    Chew 1 tablet by mouth as needed for heartburn.   DICLOFENAC SODIUM (VOLTAREN) 1 % GEL    Apply 4 g topically 2 (two) times daily as needed. Apply to right knee and ankle   ESOMEPRAZOLE (NEXIUM) 40 MG CAPSULE    Take 1 capsule (40 mg total) by mouth 2 (two) times daily before a meal.   GABAPENTIN (NEURONTIN) 100 MG CAPSULE    Take 1 capsule (100 mg total) by mouth at bedtime.   MAGNESIUM PO    Take 2 Doses by mouth at bedtime. 250 mg chewable total of 500 mg   MENTHOL, TOPICAL ANALGESIC, (BIOFREEZE) 4 % GEL    Apply topically daily as needed.   VITAMIN B-12 (CYANOCOBALAMIN) 1000 MCG TABLET    Take 1,000 mcg by mouth daily.  Modified Medications   No medications on file  Discontinued Medications   DONEPEZIL (ARICEPT) 10 MG TABLET    Take one tablet by mouth once daily at bedtime.    Physical Exam:  Vitals:   01/01/23 0816  BP: 118/70  Pulse: (!) 48  Temp: 97.6 F (36.4 C)  TempSrc: Temporal  SpO2: 98%  Weight: 182 lb 9.6 oz (82.8 kg)  Height: 5\' 9"  (1.753 m)   Body mass index is 26.97 kg/m. Wt Readings from Last 3 Encounters:  01/01/23 182 lb 9.6 oz (82.8 kg)  11/24/22 181 lb (82.1 kg)  07/03/22 181 lb 6.4 oz (82.3 kg)    Physical Exam Constitutional:      General: He is not in acute distress.    Appearance: He is well-developed. He is not diaphoretic.  HENT:     Head: Normocephalic and atraumatic.     Right Ear: External ear normal.     Left Ear: External ear normal.     Mouth/Throat:     Pharynx: No oropharyngeal exudate.  Eyes:     Conjunctiva/sclera: Conjunctivae normal.     Pupils: Pupils are equal, round, and reactive to light.  Cardiovascular:     Rate and Rhythm: Normal rate and regular rhythm.     Heart sounds: Normal heart sounds.  Pulmonary:     Effort: Pulmonary effort is normal.     Breath sounds: Normal breath sounds.   Abdominal:     General: Bowel sounds are normal.     Palpations: Abdomen is soft.  Musculoskeletal:        General: No tenderness.     Cervical back: Normal range of motion and neck supple.     Right lower leg: No edema.     Left lower leg: No edema.  Skin:    General: Skin is warm and  dry.  Neurological:     Mental Status: He is alert and oriented to person, place, and time. Mental status is at baseline.  Psychiatric:        Mood and Affect: Mood normal.     Labs reviewed: Basic Metabolic Panel: Recent Labs    01/02/22 1039 04/13/22 1455  NA 138 139  K 4.9 4.6  CL 102 101  CO2 31 30  GLUCOSE 97 89  BUN 21 17  CREATININE 1.14 1.16  CALCIUM 9.4 9.3  TSH 1.46 1.22   Liver Function Tests: Recent Labs    01/02/22 1039 04/13/22 1455  AST 19 21  ALT 18 22  ALKPHOS  --  66  BILITOT 1.1 1.0  PROT 6.8 7.0  ALBUMIN  --  4.5   No results for input(s): "LIPASE", "AMYLASE" in the last 8760 hours. No results for input(s): "AMMONIA" in the last 8760 hours. CBC: Recent Labs    01/02/22 1039 04/13/22 1455  WBC 5.9 6.8  NEUTROABS 3,339 4.3  HGB 16.2 15.2  HCT 47.3 44.5  MCV 87.6 87.8  PLT 198 177.0   Lipid Panel: No results for input(s): "CHOL", "HDL", "LDLCALC", "TRIG", "CHOLHDL", "LDLDIRECT" in the last 8760 hours. TSH: Recent Labs    01/02/22 1039 04/13/22 1455  TSH 1.46 1.22   A1C: No results found for: "HGBA1C"   Assessment/Plan 1. Bradycardia -chronic and stable, HR is low today in the 40s, no symptoms of dizziness, lightheadedness - EKG 12-Lead showing SB - CBC with Differential/Platelet - Complete Metabolic Panel with eGFR - TSH  2. Essential hypertension -Blood pressure well controlled, goal bp <140/90 Continue current medications and dietary modifications follow metabolic panel - CBC with Differential/Platelet - Complete Metabolic Panel with eGFR  3. Chronic nonintractable headache, unspecified headache type -ongoing, followed by  neurology, plan to follow up with ophthalmology. Continues on gabapentin.   4. constipation -improved with metamucil daily  5. Gastroesophageal reflux disease without esophagitis Stable on nexium  6. Primary osteoarthritis of both knees -severe OA to knees. Has seen orthopedics in the past but not recent. Voltaren and OTC topicals have been beneficial.  - Ambulatory referral to Physical Therapy for gait and strength training.   Return in about 6 months (around 07/03/2023) for routine follow up.  Janene HarveyJessica K. Biagio BorgEubanks, AGNP [facility] [phone]

## 2023-01-01 NOTE — Patient Instructions (Signed)
RECOMMENDATIONS FOR ALL PATIENTS WITH MEMORY PROBLEMS: 1. Continue to exercise (Recommend 30 minutes of walking everyday, or 3 hours every week) 2. Increase social interactions - continue going to Church and enjoy social gatherings with friends and family 3. Eat healthy, avoid fried foods and eat more fruits and vegetables 4. Maintain adequate blood pressure, blood sugar, and blood cholesterol level. Reducing the risk of stroke and cardiovascular disease also helps promoting better memory. 5. Avoid stressful situations. Live a simple life and avoid aggravations. 6. Organize your time and prepare for the next day in anticipation. 7. Sleep well, avoid any interruptions of sleep and avoid any distractions in the bedroom that may interfere with adequate sleep quality 8. Avoid sugar, avoid sweets as there is a strong link between excessive sugar intake, diabetes, and cognitive impairment 9. Mediterranean diet, which has been shown to help patients reduce the risk of progressive memory disorders and reduces cardiovascular risk. This includes eating fish, eat fruits and green leafy vegetables, nuts like almonds and hazelnuts, walnuts, and also use olive oil. Avoid fast foods and fried foods as much as possible. Avoid sweets and sugar as sugar use has been linked to worsening of memory function.  

## 2023-01-02 LAB — CBC WITH DIFFERENTIAL/PLATELET
Absolute Monocytes: 513 cells/uL (ref 200–950)
Basophils Absolute: 53 cells/uL (ref 0–200)
Basophils Relative: 0.9 %
Eosinophils Absolute: 83 cells/uL (ref 15–500)
Eosinophils Relative: 1.4 %
HCT: 44.5 % (ref 38.5–50.0)
Lymphs Abs: 1770 cells/uL (ref 850–3900)
MCV: 88.1 fL (ref 80.0–100.0)
MPV: 10 fL (ref 7.5–12.5)
Neutro Abs: 3481 cells/uL (ref 1500–7800)
Platelets: 177 10*3/uL (ref 140–400)
RBC: 5.05 10*6/uL (ref 4.20–5.80)
Total Lymphocyte: 30 %

## 2023-01-02 LAB — COMPLETE METABOLIC PANEL WITH GFR
ALT: 15 U/L (ref 9–46)
AST: 17 U/L (ref 10–35)
Alkaline phosphatase (APISO): 67 U/L (ref 35–144)
CO2: 30 mmol/L (ref 20–32)
Creat: 1.16 mg/dL (ref 0.70–1.22)
Globulin: 2.1 g/dL (calc) (ref 1.9–3.7)
Potassium: 4.6 mmol/L (ref 3.5–5.3)
Sodium: 141 mmol/L (ref 135–146)
Total Protein: 6.4 g/dL (ref 6.1–8.1)
eGFR: 62 mL/min/{1.73_m2} (ref >=60)

## 2023-01-02 LAB — TSH: TSH: 1.18 mIU/L (ref 0.40–4.50)

## 2023-01-05 ENCOUNTER — Other Ambulatory Visit: Payer: Medicare HMO

## 2023-01-05 DIAGNOSIS — Z8546 Personal history of malignant neoplasm of prostate: Secondary | ICD-10-CM | POA: Diagnosis not present

## 2023-01-06 LAB — PSA: Prostate Specific Ag, Serum: 0.7 ng/mL (ref 0.0–4.0)

## 2023-01-11 ENCOUNTER — Other Ambulatory Visit: Payer: Medicare HMO

## 2023-01-11 ENCOUNTER — Ambulatory Visit: Payer: Medicare HMO | Admitting: Urology

## 2023-01-11 ENCOUNTER — Encounter: Payer: Self-pay | Admitting: Urology

## 2023-01-11 VITALS — BP 132/71 | HR 57 | Ht 69.0 in | Wt 183.0 lb

## 2023-01-11 DIAGNOSIS — N393 Stress incontinence (female) (male): Secondary | ICD-10-CM

## 2023-01-11 DIAGNOSIS — R310 Gross hematuria: Secondary | ICD-10-CM | POA: Diagnosis not present

## 2023-01-11 DIAGNOSIS — Z87898 Personal history of other specified conditions: Secondary | ICD-10-CM

## 2023-01-11 DIAGNOSIS — R9721 Rising PSA following treatment for malignant neoplasm of prostate: Secondary | ICD-10-CM

## 2023-01-11 DIAGNOSIS — Z8546 Personal history of malignant neoplasm of prostate: Secondary | ICD-10-CM | POA: Diagnosis not present

## 2023-01-11 DIAGNOSIS — R3915 Urgency of urination: Secondary | ICD-10-CM | POA: Diagnosis not present

## 2023-01-11 DIAGNOSIS — R351 Nocturia: Secondary | ICD-10-CM

## 2023-01-11 NOTE — Progress Notes (Signed)
Subjective: 1. History of prostate cancer   2. Rising PSA following treatment for malignant neoplasm of prostate   3. Male stress incontinence   4. Nocturia   5. Urgency of urination   6. History of gross hematuria     01/11/23: Kevin [name] returns today in f/u for his history of prostate cancer with a slowly rising PSA post prostatectomy in 1997, hematuria and incontinence. His PSA is stable at 0.7 on 01/05/23.  He has a normal CBC and CMP on 01/01/23.  His IPSS is 22 with nocturia x 3 and urgency with intermittency.  He has incontinence and is using 3ppd.  He has had no recurrent hematuria.  His UA is clear.     4/20/23[name] returns today in f/u for his history of prostate cancer and hematuria.  He has incontinence that requires 3ppd. His IPSS is 20 with a reduced stream and nocturia x 3. p His PSA is up further to 0.7 this year. It was 0.6 in 4/22, 0.4 in 2/21 and 0.5 in 12/20.   He has no weight loss or bone pain.    4/21/22[name] returns today in f/u.  He had a negative hematuria w/u last year.  His PSA in 2/21 was stable at 0.4.  He had a radical prostatectomy in 1997.  He has incontinence and is using 3ppd.  He has worse leakage with activity. His IPSS is 17.     01/16/20: Kevin [name] is an 87 yo WM who is sent back in consultation by Dr. Judee Clara for an episode of gross hematuria in November.   He had blood on 3 voids with dysuria.  He has not seen it since.   He has no history of stones or UTI's.  He had some low back pain at that time as well.  He has a history of prostate cancer.  He had a radical prostatectomy in about 1997 and was last seen by me in 2002.   His recent PSA was 0.5.  The last I had recorded was 0.03.   He has had postop SUI and ED.   His UA was clear on 09/04/19 and today.   His Cr was 1.2 on 09/04/19.  He has moderate to severe LUTS with an IPSS of 22.  He wears about 3ppd.   IPSS     Row Name 01/11/23 1400         International Prostate Symptom Score   How often have  you had the sensation of not emptying your bladder? About half the time     How often have you had to urinate less than every two hours? About half the time     How often have you found you stopped and started again several times when you urinated? More than half the time     How often have you found it difficult to postpone urination? More than half the time     How often have you had a weak urinary stream? About half the time     How often have you had to strain to start urination? Less than half the time     How many times did you typically get up at night to urinate? 3 Times     Total IPSS Score 22       Quality of Life due to urinary symptoms   If you were to spend the rest of your life with your urinary condition just the way it is now how would you  feel about that? Unhappy              ROS:  Review of Systems  Constitutional:  Positive for malaise/fatigue.  Respiratory:  Negative for shortness of breath.   Cardiovascular:  Negative for chest pain.  Gastrointestinal:        Occasional LLQ pain.   Psychiatric/Behavioral:  Positive for memory loss.     No Known Allergies  Past Medical History:  Diagnosis Date   Anxiety disorder, unspecified    Arthritis    Cancer    prostate   Cataract    Edema    Erectile dysfunction    Glaucoma    History of blood in urine    History of cataract surgery 09/26/2015   Both Eyes by Dr.Hunt.   History of colonoscopy 09/26/2007   Dr.LeBaur   Indigestion    Knee pain    Pain in unspecified hip    Prostate cancer    Urinary incontinence     Past Surgical History:  Procedure Laterality Date   CATARACT EXTRACTION W/PHACO Right 01/20/2016   Procedure: CATARACT EXTRACTION PHACO AND INTRAOCULAR LENS PLACEMENT (IOC);  Surgeon: Gemma Payor, MD;  Location: [facility];  Service: Ophthalmology;  Laterality: Right;  CDE 15.42   CATARACT EXTRACTION W/PHACO Left 03/06/2016   Procedure: CATARACT EXTRACTION PHACO AND INTRAOCULAR LENS PLACEMENT LEFT  EYE;  CDE:  15.81;  Surgeon: Gemma Payor, MD;  Location: [facility];  Service: Ophthalmology;  Laterality: Left;   CHOLECYSTECTOMY  2011   COLONOSCOPY  07/03/2008   FRACTURE SURGERY  09/25/1966   [facility].    GALLBLADDER SURGERY  09/26/2007   Dr.Jenkins    HERNIA REPAIR Right 09/26/1991   Dr.Smith    PROSTATE SURGERY     cancer   SUPRAPUBIC PROSTATECTOMY  09/26/1995   Dr.Allura Doepke   VASECTOMY  09/25/1972   [facility].     Social History   Socioeconomic History   Marital status: Married    Spouse name: Not on file   Number of children: 3   Years of education: Not on file   Highest education level: Not on file  Occupational History   Occupation: retired  Tobacco Use   Smoking status: Former    Packs/day: 0.50    Years: 16.00    Additional pack years: 0.00    Total pack years: 8.00    Types: Cigarettes, Cigars    Quit date: 01/18/2004    Years since quitting: 18.9   Smokeless tobacco: Never  Vaping Use   Vaping Use: Never used  Substance and Sexual Activity   Alcohol use: Yes    Alcohol/week: 1.0 standard drink of alcohol    Types: 1 Cans of beer per week    Comment: 1 beer week   Drug use: No   Sexual activity: Not Currently    Birth control/protection: None  Other Topics Concern   Not on file  Social History Narrative   Tobacco use, amount per day now: Non smoker for 16 years.   Past tobacco use, amount per day: 1/2 Pack of cigarettes a day or 3 cigars.    How many years did you use tobacco: 15 years.   Alcohol use (drinks per week): 0-2 drinks.   Diet: Normal.    Do you drink/eat things with caffeine: Yes.   Marital status:  Married  What year were you married? 1981   Do you live in a house, apartment, assisted living, condo, trailer, etc.? House.   Is it one or more stories? 2 stories.    How many persons live in your home? 2   Do you have pets in your home?( please list)  Yes 2 dogs, 1 cat.   Highest Level of  education completed: High School GED.   Current or past profession: Estate manager/land agent.    Do you exercise? Not regularly.                                    Type and how often? Working outside on yard.    Do you have a living will? Yes.   Do you have a DNR form?  Yes.                                 If not, do you want to discuss one?   Do you have signed POA/HPOA forms? Yes.                       If so, please bring to you appointment    Do you have any difficulty bathing or dressing yourself? No.    Do you have difficulty preparing food or eating? No.   Do you have difficulty managing your medications? No.   Do you have any difficulty managing your finances? No.    Do you have any difficulty affording your medications? No.       Social Determinants of Health   Financial Resource Strain: Medium Risk (12/30/2022)   Overall Financial Resource Strain (CARDIA)    Difficulty of Paying Living Expenses: Somewhat hard  Food Insecurity: No Food Insecurity (12/30/2022)   Hunger Vital Sign    Worried About Running Out of Food in the Last Year: Never true    Ran Out of Food in the Last Year: Never true  Transportation Needs: No Transportation Needs (12/30/2022)   PRAPARE - Administrator, Civil Service (Medical): No    Lack of Transportation (Non-Medical): No  Physical Activity: Insufficiently Active (12/30/2022)   Exercise Vital Sign    Days of Exercise per Week: 2 days    Minutes of Exercise per Session: 60 min  Stress: No Stress Concern Present (12/30/2022)   [employer] of Occupational Health - Occupational Stress Questionnaire    Feeling of Stress : Only a little  Social Connections: Socially Integrated (12/30/2022)   Social Connection and Isolation Panel [NHANES]    Frequency of Communication with Friends and Family: Twice a week    Frequency of Social Gatherings with Friends and Family: Once a week    Attends Religious Services: More than 4 times per year     Active Member of [employer] or Organizations: Yes    Attends Engineer, structural: Not on file    Marital Status: Married  Catering manager Violence: Not on file    Family History  Problem Relation Age of Onset   Healthy Mother    Hypertension Mother    Dementia Mother    Heart disease Father    Dementia Father    Heart failure Father    Heart disease Brother    Stroke Brother    Hypertension Son    Stroke Brother  Anti-infectives: Anti-infectives (From admission, onward)    None       Current Outpatient Medications  Medication Sig Dispense Refill   acetaminophen (TYLENOL) 325 MG tablet Take 650 mg by mouth every 6 (six) hours as needed.     calcium carbonate (TUMS EX) 750 MG chewable tablet Chew 1 tablet by mouth as needed for heartburn.     diclofenac Sodium (VOLTAREN) 1 % GEL Apply 4 g topically 2 (two) times daily as needed. Apply to right knee and ankle     esomeprazole (NEXIUM) 40 MG capsule Take 1 capsule (40 mg total) by mouth 2 (two) times daily before a meal. 60 capsule 0   gabapentin (NEURONTIN) 100 MG capsule Take 1 capsule (100 mg total) by mouth at bedtime. 30 capsule 5   MAGNESIUM PO Take 2 Doses by mouth at bedtime. 250 mg chewable total of 500 mg     Menthol, Topical Analgesic, (BIOFREEZE) 4 % GEL Apply topically daily as needed.     vitamin B-12 (CYANOCOBALAMIN) 1000 MCG tablet Take 1,000 mcg by mouth daily.     No current facility-administered medications for this visit.     Objective: BP 132/71   Pulse (!) 57   Ht 5\' 9"  (1.753 m)   Wt 183 lb (83 kg)   BMI 27.02 kg/m    Physical Exam Vitals reviewed.  Constitutional:      Appearance: Normal appearance.  Abdominal:     General: Abdomen is flat.     Palpations: Abdomen is soft.     Hernia: No hernia is present.  Neurological:     Mental Status: He is alert.     Lab Results:  Results for orders placed or performed in visit on 01/11/23 (from the past 24 hour(s))  Urinalysis,  Routine w reflex microscopic     Status: None   Collection Time: 01/11/23  2:21 PM  Result Value Ref Range   Specific Gravity, UA 1.020 1.005 - 1.030   pH, UA 5.5 5.0 - 7.5   Color, UA Yellow Yellow   Appearance Ur Clear Clear   Leukocytes,UA Negative Negative   Protein,UA Negative Negative/Trace   Glucose, UA Negative Negative   Ketones, UA Negative Negative   RBC, UA Negative Negative   Bilirubin, UA Negative Negative   Urobilinogen, Ur 1.0 0.2 - 1.0 mg/dL   Nitrite, UA Negative Negative   Microscopic Examination Comment    Narrative   Performed at:  [address] [facility] [place] [address] [place], [place]  [id number] Lab Director: Chinita Pester MT, Phone:  [phone]      Lab Results  Component Value Date   PSA1 0.7 01/05/2023   PSA1 0.7 01/05/2022   PSA1 0.6 01/13/2021    BMET No results for input(s): "NA", "K", "CL", "CO2", "GLUCOSE", "BUN", "CREATININE", "CALCIUM" in the last 72 hours. PT/INR No results for input(s): "LABPROT", "INR" in the last 72 hours. ABG No results for input(s): "PHART", "HCO3" in the last 72 hours.  Invalid input(s): "PCO2", "PO2" UA is clear.  Studies/Results: No results found.   Assessment/Plan: Gross hematuria.    He has had no recurrent bleeding.   Prostate cancer with rising PSA s/p prostatectomy.   His PSA is is stable over the last year.    SUI.  He is content with current management.   LLQ discomfort that is intermittent and mild. No hernia noted.   No orders of the defined types were placed in this encounter.    Orders  Placed This Encounter  Procedures   Urinalysis, Routine w reflex microscopic   PSA    Standing Status:   Future    Standing Expiration Date:   01/11/2024     Return in about 1 year (around 01/11/2024).    CC: Dr. Dorette Grate.      Bjorn Pippin 01/12/2023 [phone]

## 2023-01-12 LAB — URINALYSIS, ROUTINE W REFLEX MICROSCOPIC
Bilirubin, UA: NEGATIVE
Glucose, UA: NEGATIVE
Ketones, UA: NEGATIVE
Leukocytes,UA: NEGATIVE
Nitrite, UA: NEGATIVE
Protein,UA: NEGATIVE
RBC, UA: NEGATIVE
Specific Gravity, UA: 1.02 (ref 1.005–1.030)
Urobilinogen, Ur: 1 mg/dL (ref 0.2–1.0)
pH, UA: 5.5 (ref 5.0–7.5)

## 2023-01-18 ENCOUNTER — Ambulatory Visit: Payer: Medicare HMO | Admitting: Urology

## 2023-01-19 ENCOUNTER — Other Ambulatory Visit: Payer: Self-pay

## 2023-01-19 ENCOUNTER — Ambulatory Visit (HOSPITAL_COMMUNITY): Payer: Medicare HMO | Attending: Nurse Practitioner | Admitting: Physical Therapy

## 2023-01-19 DIAGNOSIS — M25571 Pain in right ankle and joints of right foot: Secondary | ICD-10-CM | POA: Diagnosis not present

## 2023-01-19 DIAGNOSIS — R2681 Unsteadiness on feet: Secondary | ICD-10-CM | POA: Diagnosis not present

## 2023-01-19 DIAGNOSIS — G8929 Other chronic pain: Secondary | ICD-10-CM

## 2023-01-19 DIAGNOSIS — M170 Bilateral primary osteoarthritis of knee: Secondary | ICD-10-CM | POA: Diagnosis not present

## 2023-01-19 DIAGNOSIS — M25561 Pain in right knee: Secondary | ICD-10-CM | POA: Diagnosis not present

## 2023-01-19 NOTE — Therapy (Signed)
OUTPATIENT PHYSICAL THERAPY LOWER EXTREMITY EVALUATION   Patient Name: Kevin [name] MRN: [id number] DOB:[birth year], 87 y.o., male Today's Date: 01/19/2023  END OF SESSION:  PT End of Session - 01/19/23 0925     Visit Number 1    Number of Visits 12    Date for PT Re-Evaluation 03/02/23    Authorization Type [employer]    Progress Note Due on Visit 10    PT Start Time 0817    PT Stop Time 0901    PT Time Calculation (min) 44 min    Activity Tolerance Patient tolerated treatment well    Behavior During Therapy [facility] for tasks assessed/performed             Past Medical History:  Diagnosis Date   Anxiety disorder, unspecified    Arthritis    Cancer (HCC)    prostate   Cataract    Edema    Erectile dysfunction    Glaucoma    History of blood in urine    History of cataract surgery 09/26/2015   Both Eyes by Kevin [name].   History of colonoscopy 09/26/2007   Kevin [name]   Indigestion    Knee pain    Pain in unspecified hip    Prostate cancer Kevin [name])    Urinary incontinence    Past Surgical History:  Procedure Laterality Date   CATARACT EXTRACTION W/PHACO Right 01/20/2016   Procedure: CATARACT EXTRACTION PHACO AND INTRAOCULAR LENS PLACEMENT (IOC);  Surgeon: Kevin [name];  Location: Kevin [name];  Service: Ophthalmology;  Laterality: Right;  CDE 15.42   CATARACT EXTRACTION W/PHACO Left 03/06/2016   Procedure: CATARACT EXTRACTION PHACO AND INTRAOCULAR LENS PLACEMENT LEFT EYE;  CDE:  15.81;  Surgeon: Kevin [name];  Location: Kevin [name];  Service: Ophthalmology;  Laterality: Left;   CHOLECYSTECTOMY  2011   COLONOSCOPY  07/03/2008   FRACTURE SURGERY  09/25/1966   Kevin [name].    GALLBLADDER SURGERY  09/26/2007   Kevin [name]    HERNIA REPAIR Right 09/26/1991   Kevin [name]    PROSTATE SURGERY     cancer   SUPRAPUBIC PROSTATECTOMY  09/26/1995   Kevin [name]   VASECTOMY  09/25/1972   Kevin [name].    Patient Active Problem List   Diagnosis Date Noted   History of prostate  cancer 10/20/2020   Chest pain 09/04/2019   Hematuria 09/04/2019   Dysphagia 09/04/2019   Pain in unspecified hip    Knee pain    Edema    Anxiety disorder, unspecified     PCP: Kevin Seller, NP  REFERRING PROVIDER: Sharon Seller, NP  REFERRING DIAG: M17.0 (ICD-10-CM) - Primary osteoarthritis of both knees  THERAPY DIAG:  Right knee pain Left knee pain Muscle weakness  Rationale for Evaluation and Treatment: Rehabilitation  ONSET DATE: chronic  SUBJECTIVE STATEMENT: Pt states that his Rt knee is significantly worse than his left knee.   He states as soon as he weight bears he has pain.  The pain is all over.  He also has pain in his ankle.    PERTINENT HISTORY: Prostate cancer, OA PAIN:  Are you having pain? Yes: Pain location:2 in Rt ankle 0 in RT knee;  worst for the ankle is a 10/10; worst Rt knee 10/10; Left knee 2/10 Pain description: hurts  Aggravating factors: weight bearing  Relieving factors: sitting   PRECAUTIONS: None  WEIGHT BEARING RESTRICTIONS: No  FALLS:  Has patient fallen in last 6 months? No  LIVING ENVIRONMENT: Lives with: lives  with their family Lives in: House/apartment Stairs: Yes: Internal: 8 steps; on right going up and External: 2 steps; on right going up Has following equipment at home: Single point cane  OCCUPATION: retired  PLOF: Independent  PATIENT GOALS: less pain   NEXT [name] VISIT: 6 months  OBJECTIVE:   DIAGNOSTIC FINDINGS: Impression severe arthritis medial compartment right knee with mild varus   PATIENT SURVEYS:  FOTO 40  COGNITION: Overall cognitive status: Within functional limits for tasks assessed     EDEMA:  Slight    LOWER EXTREMITY ROM:  Active ROM Right eval Left eval  Hip flexion    Hip extension    Hip abduction    Hip adduction    Hip internal rotation    Hip external rotation    Knee flexion 108   Knee extension 6   Ankle dorsiflexion    Ankle plantarflexion    Ankle  inversion    Ankle eversion     (Blank rows = not tested)  LOWER EXTREMITY MMT:  MMT Right eval Left eval  Hip flexion 2+    Hip extension 2   Hip abduction 3   Hip adduction    Hip internal rotation    Hip external rotation    Knee flexion    Knee extension 3+ 4-  Ankle dorsiflexion 5 5  Ankle plantarflexion    Ankle inversion    Ankle eversion     (Blank rows = not tested)    FUNCTIONAL TESTS:  30 seconds chair stand test:  4 ( 6 is poor for pt age and sex ) 2 minute walk test: 226 ft with cane increased knee pain and unsteady gait.   Single leg stance:  Rt: 0 , LT:  8    TODAY'S TREATMENT:                                                                                                                              DATE: 01/09/23 Evaluation Sitting: LAQ x 5 Supine: Quad set x 10 Heel slide x 5 Bent knee lift x 10 (pt is unable to complete a SLR )   PATIENT EDUCATION:  Education details: HEP Person educated: Patient Education method: Programmer, multimedia, Verbal cues, and Handouts Education comprehension: returned demonstration  HOME EXERCISE PROGRAM: Access Code: [email or web address] URL: [email or web address] Date: 01/19/2023 Prepared by: Virgina Organ  Exercises - Seated Knee Extension AROM  - 3 x daily - 7 x weekly - 1 sets - 10 reps - 5" hold - Supine Quadricep Sets  - 3 x daily - 7 x weekly - 1 sets - 10 reps - 5" hold - Supine Heel Slide  - 2 x daily - 7 x weekly - 1 sets - 10 reps - 5" hold - Hooklying Small March  - 2 x daily - 7 x weekly - 1 sets - 10 reps - 5" hold  ASSESSMENT:  CLINICAL IMPRESSION: Patient is a 87 y.o. male  who was seen today for physical therapy evaluation and treatment for bilateral knee pain. PT has severe OA in Rt and recently his LT has began to bother him as well.  Evaluation demonstrates decreased ROM, decreased strength, decreased functional mobility, decreased activity tolerance and increased pain. [name] weill  benefit from skilled PT to address these issues and improve his functional ability.   OBJECTIVE IMPAIRMENTS: decreased activity tolerance, decreased balance, decreased ROM, decreased strength, increased fascial restrictions, impaired flexibility, and pain.   ACTIVITY LIMITATIONS: carrying, lifting, standing, squatting, stairs, and locomotion level  PARTICIPATION LIMITATIONS: cleaning, community activity, and yard work  PERSONAL FACTORS: 1 comorbidity: prostate cancer  are also affecting patient's functional outcome.   REHAB POTENTIAL: Good  CLINICAL DECISION MAKING: Stable/uncomplicated  EVALUATION COMPLEXITY: Low   GOALS: Goals reviewed with patient? No  SHORT TERM GOALS: Target date: 02/09/23 PT to be I in HEP to allow pain to decrease to no greater than an 8/10 Baseline: Goal status: INITIAL  2.  PT strength to increase 1/2 grade to allow pt to be able to be able to come sit to stand from a chair without UE assist.  Baseline:  Goal status: INITIAL  3.  Pt to be able to single leg stance on Rt LE for 5 seconds to improve stability of gait.  Baseline:  Goal status: INITIAL   LONG TERM GOALS: Target date: 03/02/23  PT to be I in advanced  HEP to allow pain to decrease to no greater than a 6/10 Baseline:  Goal status: INITIAL  2.   PT strength to increase 1 grade to allow pt to be able to go up and down 4 steps in a reciprocal manner. Baseline:  Goal status: INITIAL  3.  Pt to be able to be on his feet for 10 minutes before pain increases.  Baseline:  Goal status: INITIAL   PLAN:  PT FREQUENCY: 2x/week  PT DURATION: 12 weeks  PLANNED INTERVENTIONS: Therapeutic exercises, Therapeutic activity, Neuromuscular re-education, Balance training, Gait training, Patient/Family education, Self Care, and Joint mobilization  PLAN FOR NEXT SESSION: continue with improving strength, ROM and balance .  Virgina Organ, PT CLT [phone]  01/19/2023

## 2023-01-24 ENCOUNTER — Ambulatory Visit (HOSPITAL_COMMUNITY): Payer: Medicare HMO | Attending: Nurse Practitioner | Admitting: Physical Therapy

## 2023-01-24 ENCOUNTER — Encounter (HOSPITAL_COMMUNITY): Payer: Self-pay | Admitting: Physical Therapy

## 2023-01-24 DIAGNOSIS — G8929 Other chronic pain: Secondary | ICD-10-CM | POA: Diagnosis not present

## 2023-01-24 DIAGNOSIS — R2681 Unsteadiness on feet: Secondary | ICD-10-CM | POA: Diagnosis not present

## 2023-01-24 DIAGNOSIS — M25561 Pain in right knee: Secondary | ICD-10-CM | POA: Insufficient documentation

## 2023-01-24 DIAGNOSIS — M25571 Pain in right ankle and joints of right foot: Secondary | ICD-10-CM | POA: Insufficient documentation

## 2023-01-24 NOTE — Therapy (Signed)
OUTPATIENT PHYSICAL THERAPY TREATMENT   Patient Name: Kevin [name] MRN: [id number] DOB:1936-08-18, 87 y.o., male Today's Date: 01/24/2023  END OF SESSION:  PT End of Session - 01/24/23 0732     Visit Number 2    Number of Visits 12    Date for PT Re-Evaluation 03/02/23    Authorization Type [employer]    Progress Note Due on Visit 10    PT Start Time 0732    PT Stop Time 0810    PT Time Calculation (min) 38 min    Activity Tolerance Patient tolerated treatment well    Behavior During Therapy [facility] for tasks assessed/performed             Past Medical History:  Diagnosis Date   Anxiety disorder, unspecified    Arthritis    Cancer (HCC)    prostate   Cataract    Edema    Erectile dysfunction    Glaucoma    History of blood in urine    History of cataract surgery 09/26/2015   Both Eyes by Dr.Hunt.   History of colonoscopy 09/26/2007   Dr.LeBaur   Indigestion    Knee pain    Pain in unspecified hip    Prostate cancer [facility])    Urinary incontinence    Past Surgical History:  Procedure Laterality Date   CATARACT EXTRACTION W/PHACO Right 01/20/2016   Procedure: CATARACT EXTRACTION PHACO AND INTRAOCULAR LENS PLACEMENT (IOC);  Surgeon: Gemma Payor, MD;  Location: [facility];  Service: Ophthalmology;  Laterality: Right;  CDE 15.42   CATARACT EXTRACTION W/PHACO Left 03/06/2016   Procedure: CATARACT EXTRACTION PHACO AND INTRAOCULAR LENS PLACEMENT LEFT EYE;  CDE:  15.81;  Surgeon: Gemma Payor, MD;  Location: [facility];  Service: Ophthalmology;  Laterality: Left;   CHOLECYSTECTOMY  2011   COLONOSCOPY  07/03/2008   FRACTURE SURGERY  09/25/1966   [facility].    GALLBLADDER SURGERY  09/26/2007   Dr.Jenkins    HERNIA REPAIR Right 09/26/1991   Dr.Smith    PROSTATE SURGERY     cancer   SUPRAPUBIC PROSTATECTOMY  09/26/1995   Dr.Wrenn   VASECTOMY  09/25/1972   [facility].    Patient Active Problem List   Diagnosis Date Noted   History of prostate cancer 10/20/2020    Chest pain 09/04/2019   Hematuria 09/04/2019   Dysphagia 09/04/2019   Pain in unspecified hip    Knee pain    Edema    Anxiety disorder, unspecified     PCP: Sharon Seller, NP  REFERRING PROVIDER: Sharon Seller, NP  REFERRING DIAG: M17.0 (ICD-10-CM) - Primary osteoarthritis of both knees  THERAPY DIAG:  Right knee pain Left knee pain Muscle weakness  Rationale for Evaluation and Treatment: Rehabilitation  ONSET DATE: chronic  SUBJECTIVE STATEMENT: Pt states he did a lot of walking over the weekend and knee and ankle were sore yesterday, feeling better today.   PERTINENT HISTORY: Prostate cancer, OA PAIN:  Are you having pain? Yes: Pain location:2 in Rt ankle 2 in RT knee;  worst for the ankle is a 10/10; worst Rt knee 10/10; Left knee 2/10 Pain description: hurts  Aggravating factors: weight bearing  Relieving factors: sitting   PRECAUTIONS: None  WEIGHT BEARING RESTRICTIONS: No  FALLS:  Has patient fallen in last 6 months? No  LIVING ENVIRONMENT: Lives with: lives with their family Lives in: House/apartment Stairs: Yes: Internal: 8 steps; on right going up and External: 2 steps; on right going  up Has following equipment at home: Single point cane  OCCUPATION: retired  PLOF: Independent  PATIENT GOALS: less pain   NEXT MD VISIT: 6 months  OBJECTIVE:   DIAGNOSTIC FINDINGS: Impression severe arthritis medial compartment right knee with mild varus   PATIENT SURVEYS:  FOTO 40  COGNITION: Overall cognitive status: Within functional limits for tasks assessed     EDEMA:  Slight    LOWER EXTREMITY ROM:  Active ROM Right eval Left eval  Hip flexion    Hip extension    Hip abduction    Hip adduction    Hip internal rotation    Hip external rotation    Knee flexion 108   Knee extension 6   Ankle dorsiflexion    Ankle plantarflexion    Ankle inversion    Ankle eversion     (Blank rows = not tested)  LOWER EXTREMITY MMT:  MMT  Right eval Left eval  Hip flexion 2+    Hip extension 2   Hip abduction 3   Hip adduction    Hip internal rotation    Hip external rotation    Knee flexion    Knee extension 3+ 4-  Ankle dorsiflexion 5 5  Ankle plantarflexion    Ankle inversion    Ankle eversion     (Blank rows = not tested)    FUNCTIONAL TESTS:  30 seconds chair stand test:  4 ( 6 is poor for pt age and sex ) 2 minute walk test: 226 ft with cane increased knee pain and unsteady gait.   Single leg stance:  Rt: 0 , LT:  8    TODAY'S TREATMENT:                                                                                                                              DATE:  01/24/23 LAQ 10 x 5 second holds Standing HR 1 x 10 Standing TR 1 x 10 Standing hip abduction 1 x 10 bilateral  Standing hip extension 1 x 10 bilateral  Standing hamstring curl 2# 1 x 10 bilateral  Quad set 1 x 10 with 5 second holds bilateral  Bent knee lift x 10   01/09/23 Evaluation Sitting: LAQ x 5 Supine: Quad set x 10 Heel slide x 5 Bent knee lift x 10 (pt is unable to complete a SLR )   PATIENT EDUCATION:  Education details: HEP Person educated: Patient Education method: Programmer, multimedia, Verbal cues, and Handouts Education comprehension: returned demonstration  HOME EXERCISE PROGRAM: Access Code: [email or web address] URL: [email or web address]  Date: 01/19/2023 - Seated Knee Extension AROM  - 3 x daily - 7 x weekly - 1 sets - 10 reps - 5" hold - Supine Quadricep Sets  - 3 x daily - 7 x weekly - 1 sets - 10 reps - 5" hold - Supine Heel Slide  - 2 x daily - 7 x weekly - 1 sets - 10 reps - 5"  hold - Hooklying Small March  - 2 x daily - 7 x weekly - 1 sets - 10 reps - 5" hold  ASSESSMENT:  CLINICAL IMPRESSION: Began standing LE strengthening exercises which are tolerated well. Intermittent cueing for mechanics/posture with good carry over. Moderate fatigue at end of session. Review of previously completed exercises  with cueing required. Patient will continue to benefit from physical therapy in order to improve function and reduce impairment.   OBJECTIVE IMPAIRMENTS: decreased activity tolerance, decreased balance, decreased ROM, decreased strength, increased fascial restrictions, impaired flexibility, and pain.   ACTIVITY LIMITATIONS: carrying, lifting, standing, squatting, stairs, and locomotion level  PARTICIPATION LIMITATIONS: cleaning, community activity, and yard work  PERSONAL FACTORS: 1 comorbidity: prostate cancer  are also affecting patient's functional outcome.   REHAB POTENTIAL: Good  CLINICAL DECISION MAKING: Stable/uncomplicated  EVALUATION COMPLEXITY: Low   GOALS: Goals reviewed with patient? No  SHORT TERM GOALS: Target date: 02/09/23 PT to be I in HEP to allow pain to decrease to no greater than an 8/10 Baseline: Goal status: INITIAL  2.  PT strength to increase 1/2 grade to allow pt to be able to be able to come sit to stand from a chair without UE assist.  Baseline:  Goal status: INITIAL  3.  Pt to be able to single leg stance on Rt LE for 5 seconds to improve stability of gait.  Baseline:  Goal status: INITIAL   LONG TERM GOALS: Target date: 03/02/23  PT to be I in advanced  HEP to allow pain to decrease to no greater than a 6/10 Baseline:  Goal status: INITIAL  2.   PT strength to increase 1 grade to allow pt to be able to go up and down 4 steps in a reciprocal manner. Baseline:  Goal status: INITIAL  3.  Pt to be able to be on his feet for 10 minutes before pain increases.  Baseline:  Goal status: INITIAL   PLAN:  PT FREQUENCY: 2x/week  PT DURATION: 12 weeks  PLANNED INTERVENTIONS: Therapeutic exercises, Therapeutic activity, Neuromuscular re-education, Balance training, Gait training, Patient/Family education, Self Care, and Joint mobilization  PLAN FOR NEXT SESSION: continue with improving strength, ROM and balance .  7:32 AM, 01/24/23 Wyman Songster PT, DPT Physical Therapist at [facility]

## 2023-01-25 ENCOUNTER — Ambulatory Visit (HOSPITAL_COMMUNITY): Payer: Medicare HMO | Admitting: Physical Therapy

## 2023-01-25 DIAGNOSIS — M25561 Pain in right knee: Secondary | ICD-10-CM | POA: Diagnosis not present

## 2023-01-25 DIAGNOSIS — G8929 Other chronic pain: Secondary | ICD-10-CM

## 2023-01-25 DIAGNOSIS — M25571 Pain in right ankle and joints of right foot: Secondary | ICD-10-CM | POA: Diagnosis not present

## 2023-01-25 DIAGNOSIS — R2681 Unsteadiness on feet: Secondary | ICD-10-CM | POA: Diagnosis not present

## 2023-01-25 NOTE — Therapy (Signed)
OUTPATIENT PHYSICAL THERAPY TREATMENT   Patient Name: Kevin [name] MRN: [id number] DOB:01-Jan-1936, 87 y.o., male Today's Date: 01/25/2023  END OF SESSION:  PT End of Session - 01/25/23 1303     Visit Number 3    Number of Visits 12    Date for PT Re-Evaluation 03/02/23    Authorization Type [employer]    Progress Note Due on Visit 10    PT Start Time 1303    PT Stop Time 1345    PT Time Calculation (min) 42 min    Activity Tolerance Patient tolerated treatment well    Behavior During Therapy WFL for tasks assessed/performed             Past Medical History:  Diagnosis Date   Anxiety disorder, unspecified    Arthritis    Cancer (HCC)    prostate   Cataract    Edema    Erectile dysfunction    Glaucoma    History of blood in urine    History of cataract surgery 09/26/2015   Both Eyes by Dr.Hunt.   History of colonoscopy 09/26/2007   Dr.LeBaur   Indigestion    Knee pain    Pain in unspecified hip    Prostate cancer [facility])    Urinary incontinence    Past Surgical History:  Procedure Laterality Date   CATARACT EXTRACTION W/PHACO Right 01/20/2016   Procedure: CATARACT EXTRACTION PHACO AND INTRAOCULAR LENS PLACEMENT (IOC);  Surgeon: Gemma Payor, MD;  Location: [facility];  Service: Ophthalmology;  Laterality: Right;  CDE 15.42   CATARACT EXTRACTION W/PHACO Left 03/06/2016   Procedure: CATARACT EXTRACTION PHACO AND INTRAOCULAR LENS PLACEMENT LEFT EYE;  CDE:  15.81;  Surgeon: Gemma Payor, MD;  Location: [facility];  Service: Ophthalmology;  Laterality: Left;   CHOLECYSTECTOMY  2011   COLONOSCOPY  07/03/2008   FRACTURE SURGERY  09/25/1966   [facility].    GALLBLADDER SURGERY  09/26/2007   Dr.Jenkins    HERNIA REPAIR Right 09/26/1991   Dr.Smith    PROSTATE SURGERY     cancer   SUPRAPUBIC PROSTATECTOMY  09/26/1995   Dr.Wrenn   VASECTOMY  09/25/1972   [facility].    Patient Active Problem List   Diagnosis Date Noted   History of prostate cancer 10/20/2020    Chest pain 09/04/2019   Hematuria 09/04/2019   Dysphagia 09/04/2019   Pain in unspecified hip    Knee pain    Edema    Anxiety disorder, unspecified     PCP: Sharon Seller, NP  REFERRING PROVIDER: Sharon Seller, NP  REFERRING DIAG: M17.0 (ICD-10-CM) - Primary osteoarthritis of both knees  THERAPY DIAG:  Right knee pain Left knee pain Muscle weakness  Rationale for Evaluation and Treatment: Rehabilitation  ONSET DATE: chronic  SUBJECTIVE STATEMENT: Pt states he is doing well today without pain, however he has not done anything today.  States [place] was going [place] and [place]   PERTINENT HISTORY: Prostate cancer, OA PAIN:  Are you having pain? Yes: Pain location:2 in Rt ankle 2 in RT knee;  worst for the ankle is a 10/10; worst Rt knee 10/10; Left knee 2/10 Pain description: hurts  Aggravating factors: weight bearing  Relieving factors: sitting   PRECAUTIONS: None  WEIGHT BEARING RESTRICTIONS: No  FALLS:  Has patient fallen in last 6 months? No  LIVING ENVIRONMENT: Lives with: lives with their family Lives in: House/apartment Stairs:  Yes: Internal: 8 steps; on right going up and External: 2 steps; on right going up Has following equipment at home: Single point cane  OCCUPATION: retired  PLOF: Independent  PATIENT GOALS: less pain   NEXT MD VISIT: 6 months  OBJECTIVE:   DIAGNOSTIC FINDINGS: Impression severe arthritis medial compartment right knee with mild varus   PATIENT SURVEYS:  FOTO 40  COGNITION: Overall cognitive status: Within functional limits for tasks assessed     EDEMA:  Slight    LOWER EXTREMITY ROM:  Active ROM Right eval Left eval  Hip flexion    Hip extension    Hip abduction    Hip adduction    Hip internal rotation    Hip external rotation    Knee flexion 108   Knee extension 6   Ankle dorsiflexion    Ankle plantarflexion    Ankle inversion    Ankle  eversion     (Blank rows = not tested)  LOWER EXTREMITY MMT:  MMT Right eval Left eval  Hip flexion 2+    Hip extension 2   Hip abduction 3   Hip adduction    Hip internal rotation    Hip external rotation    Knee flexion    Knee extension 3+ 4-  Ankle dorsiflexion 5 5  Ankle plantarflexion    Ankle inversion    Ankle eversion     (Blank rows = not tested)    FUNCTIONAL TESTS:  30 seconds chair stand test:  4 ( 6 is poor for pt age and sex ) 2 minute walk test: 226 ft with cane increased knee pain and unsteady gait.   Single leg stance:  Rt: 0 , LT:  8    TODAY'S TREATMENT:                                                                                                                              DATE:  01/25/23 Standing:  heelraises 20X  Toe raises 20X  Hip abduction 10X each LE  Hip extension 10X each LE  Hamstring stretch bil 10X each Sit to stands no UE 5X2 standard chair  LAQ 10X5" each LE Supine:  quad set 10X5"  SLR 10X each  Bridge 10X   01/24/23 LAQ 10 x 5 second holds Standing HR 1 x 10 Standing TR 1 x 10 Standing hip abduction 1 x 10 bilateral  Standing hip extension 1 x 10 bilateral  Standing hamstring curl 2# 1 x 10 bilateral  Quad set 1 x 10 with 5 second holds bilateral  Bent knee lift x 10   01/09/23 Evaluation Sitting: LAQ x 5 Supine: Quad set x 10 Heel slide x 5 Bent knee lift x 10 (pt is unable to complete a SLR )   PATIENT EDUCATION:  Education details: HEP Person educated: Patient Education method: Programmer, multimedia, Verbal cues, and Handouts Education comprehension: returned demonstration  HOME EXERCISE PROGRAM: Access Code: [email or web address] URL: [email or web address]  Date: 01/19/2023 - Seated Knee Extension AROM  - 3 x daily - 7 x weekly - 1 sets - 10 reps - 5" hold - Supine Quadricep Sets  - 3 x daily - 7 x weekly - 1 sets - 10 reps - 5" hold - Supine Heel Slide  - 2 x daily - 7 x weekly - 1 sets - 10 reps - 5" hold -  Hooklying Small March  - 2 x daily - 7 x weekly - 1 sets - 10 reps - 5" hold  ASSESSMENT:  CLINICAL IMPRESSION: Continued with established therex with focus on improving LE strength and stability.  Began repeated sit to stands working on eccentric control with overall good form and no pain.  Intermittent cueing for mechanics/posture with good carry over. No fatigue noted at EOS, however does complete all mobility/activities slowly. No new exercises added to HEP this session.  Patient will continue to benefit from physical therapy in order to improve function and reduce impairment.   OBJECTIVE IMPAIRMENTS: decreased activity tolerance, decreased balance, decreased ROM, decreased strength, increased fascial restrictions, impaired flexibility, and pain.   ACTIVITY LIMITATIONS: carrying, lifting, standing, squatting, stairs, and locomotion level  PARTICIPATION LIMITATIONS: cleaning, community activity, and yard work  PERSONAL FACTORS: 1 comorbidity: prostate cancer  are also affecting patient's functional outcome.   REHAB POTENTIAL: Good  CLINICAL DECISION MAKING: Stable/uncomplicated  EVALUATION COMPLEXITY: Low   GOALS: Goals reviewed with patient? No  SHORT TERM GOALS: Target date: 02/09/23 PT to be I in HEP to allow pain to decrease to no greater than an 8/10 Baseline: Goal status: INITIAL  2.  PT strength to increase 1/2 grade to allow pt to be able to be able to come sit to stand from a chair without UE assist.  Baseline:  Goal status: INITIAL  3.  Pt to be able to single leg stance on Rt LE for 5 seconds to improve stability of gait.  Baseline:  Goal status: INITIAL   LONG TERM GOALS: Target date: 03/02/23  PT to be I in advanced  HEP to allow pain to decrease to no greater than a 6/10 Baseline:  Goal status: INITIAL  2.   PT strength to increase 1 grade to allow pt to be able to go up and down 4 steps in a reciprocal manner. Baseline:  Goal status: INITIAL  3.  Pt to  be able to be on his feet for 10 minutes before pain increases.  Baseline:  Goal status: INITIAL   PLAN:  PT FREQUENCY: 2x/week  PT DURATION: 12 weeks  PLANNED INTERVENTIONS: Therapeutic exercises, Therapeutic activity, Neuromuscular re-education, Balance training, Gait training, Patient/Family education, Self Care, and Joint mobilization  PLAN FOR NEXT SESSION: continue with improving strength, ROM and balance .  1:04 PM, 01/25/23  Lurena Nida, PTA/CLT [facility] Ph: [phone]

## 2023-01-29 ENCOUNTER — Encounter (HOSPITAL_COMMUNITY): Payer: Self-pay | Admitting: Physical Therapy

## 2023-01-29 ENCOUNTER — Ambulatory Visit (HOSPITAL_COMMUNITY): Payer: Medicare HMO | Admitting: Physical Therapy

## 2023-01-29 DIAGNOSIS — M25571 Pain in right ankle and joints of right foot: Secondary | ICD-10-CM

## 2023-01-29 DIAGNOSIS — R2681 Unsteadiness on feet: Secondary | ICD-10-CM | POA: Diagnosis not present

## 2023-01-29 DIAGNOSIS — M25561 Pain in right knee: Secondary | ICD-10-CM | POA: Diagnosis not present

## 2023-01-29 DIAGNOSIS — G8929 Other chronic pain: Secondary | ICD-10-CM | POA: Diagnosis not present

## 2023-01-29 NOTE — Therapy (Signed)
OUTPATIENT PHYSICAL THERAPY TREATMENT   Patient Name: Kevin [name] MRN: [id number] DOB:1935/11/05, 87 y.o., male Today's Date: 01/29/2023  END OF SESSION:  PT End of Session - 01/29/23 0734     Visit Number 4    Number of Visits 12    Date for PT Re-Evaluation 03/02/23    Authorization Type [employer]    Progress Note Due on Visit 10    PT Start Time 0734    PT Stop Time 0812    PT Time Calculation (min) 38 min    Activity Tolerance Patient tolerated treatment well    Behavior During Therapy [facility] for tasks assessed/performed             Past Medical History:  Diagnosis Date   Anxiety disorder, unspecified    Arthritis    Cancer (HCC)    prostate   Cataract    Edema    Erectile dysfunction    Glaucoma    History of blood in urine    History of cataract surgery 09/26/2015   Both Eyes by Dr.Hunt.   History of colonoscopy 09/26/2007   Dr.LeBaur   Indigestion    Knee pain    Pain in unspecified hip    Prostate cancer [facility])    Urinary incontinence    Past Surgical History:  Procedure Laterality Date   CATARACT EXTRACTION W/PHACO Right 01/20/2016   Procedure: CATARACT EXTRACTION PHACO AND INTRAOCULAR LENS PLACEMENT (IOC);  Surgeon: Gemma Payor, MD;  Location: [facility];  Service: Ophthalmology;  Laterality: Right;  CDE 15.42   CATARACT EXTRACTION W/PHACO Left 03/06/2016   Procedure: CATARACT EXTRACTION PHACO AND INTRAOCULAR LENS PLACEMENT LEFT EYE;  CDE:  15.81;  Surgeon: Gemma Payor, MD;  Location: [facility];  Service: Ophthalmology;  Laterality: Left;   CHOLECYSTECTOMY  2011   COLONOSCOPY  07/03/2008   FRACTURE SURGERY  09/25/1966   [facility].    GALLBLADDER SURGERY  09/26/2007   Dr.Jenkins    HERNIA REPAIR Right 09/26/1991   Dr.Smith    PROSTATE SURGERY     cancer   SUPRAPUBIC PROSTATECTOMY  09/26/1995   Dr.Wrenn   VASECTOMY  09/25/1972   [facility].    Patient Active Problem List   Diagnosis Date Noted   History of prostate cancer 10/20/2020    Chest pain 09/04/2019   Hematuria 09/04/2019   Dysphagia 09/04/2019   Pain in unspecified hip    Knee pain    Edema    Anxiety disorder, unspecified     PCP: Sharon Seller, NP  REFERRING PROVIDER: Sharon Seller, NP  REFERRING DIAG: M17.0 (ICD-10-CM) - Primary osteoarthritis of both knees  THERAPY DIAG:  Right knee pain Left knee pain Muscle weakness  Rationale for Evaluation and Treatment: Rehabilitation  ONSET DATE: chronic  SUBJECTIVE STATEMENT: Pt states legs are about the same. HEP going well.   PERTINENT HISTORY: Prostate cancer, OA PAIN:  Are you having pain? Yes: Pain location:2 in Rt ankle 2 in RT knee;  worst for the ankle is a 10/10; worst Rt knee 10/10; Left knee 2/10 Pain description: hurts  Aggravating factors: weight bearing  Relieving factors: sitting   PRECAUTIONS: None  WEIGHT BEARING RESTRICTIONS: No  FALLS:  Has patient fallen in last 6 months? No  LIVING ENVIRONMENT: Lives with: lives with their family Lives in: House/apartment Stairs: Yes: Internal: 8 steps; on right going up and External: 2 steps; on right going up Has following equipment at home: Single point cane  OCCUPATION:  retired  PLOF: Independent  PATIENT GOALS: less pain   NEXT MD VISIT: 6 months  OBJECTIVE:   DIAGNOSTIC FINDINGS: Impression severe arthritis medial compartment right knee with mild varus   PATIENT SURVEYS:  FOTO 40  COGNITION: Overall cognitive status: Within functional limits for tasks assessed     EDEMA:  Slight    LOWER EXTREMITY ROM:  Active ROM Right eval Left eval  Hip flexion    Hip extension    Hip abduction    Hip adduction    Hip internal rotation    Hip external rotation    Knee flexion 108   Knee extension 6   Ankle dorsiflexion    Ankle plantarflexion    Ankle inversion    Ankle eversion     (Blank rows = not tested)  LOWER EXTREMITY MMT:  MMT Right eval Left eval  Hip flexion 2+    Hip extension 2    Hip abduction 3   Hip adduction    Hip internal rotation    Hip external rotation    Knee flexion    Knee extension 3+ 4-  Ankle dorsiflexion 5 5  Ankle plantarflexion    Ankle inversion    Ankle eversion     (Blank rows = not tested)    FUNCTIONAL TESTS:  30 seconds chair stand test:  4 ( 6 is poor for pt age and sex ) 2 minute walk test: 226 ft with cane increased knee pain and unsteady gait.   Single leg stance:  Rt: 0 , LT:  8    TODAY'S TREATMENT:                                                                                                                              DATE:  01/29/23 Standing calf stretch on incline board 3 x 30 second holds Standing HR 1 x 20 Standing TR 1 x 20 Standing hip abduction 1 x 15 bilateral  Standing hip extension 1 x 15 bilateral  Step up 4 inch 1 x 10 bilateral  Lateral step up 4 inch 1 x 10 bilateral  Seated BAPS board L3 - PF/DF, inv/ev, CW/CCW - 1 x 10 each   01/25/23 Standing:  heelraises 20X  Toe raises 20X  Hip abduction 10X each LE  Hip extension 10X each LE  Hamstring stretch bil 10X each Sit to stands no UE 5X2 standard chair  LAQ 10X5" each LE Supine:  quad set 10X5"  SLR 10X each  Bridge 10X   01/24/23 LAQ 10 x 5 second holds Standing HR 1 x 10 Standing TR 1 x 10 Standing hip abduction 1 x 10 bilateral  Standing hip extension 1 x 10 bilateral  Standing hamstring curl 2# 1 x 10 bilateral  Quad set 1 x 10 with 5 second holds bilateral  Bent knee lift x 10   01/09/23 Evaluation Sitting: LAQ x 5 Supine: Quad set x 10 Heel slide  x 5 Bent knee lift x 10 (pt is unable to complete a SLR )   PATIENT EDUCATION:  Education details: HEP Person educated: Patient Education method: Explanation, Verbal cues, and Handouts Education comprehension: returned demonstration  HOME EXERCISE PROGRAM: Access Code: [email or web address] URL: [email or web address]  Date: 01/19/2023 - Seated Knee Extension AROM  - 3 x daily -  7 x weekly - 1 sets - 10 reps - 5" hold - Supine Quadricep Sets  - 3 x daily - 7 x weekly - 1 sets - 10 reps - 5" hold - Supine Heel Slide  - 2 x daily - 7 x weekly - 1 sets - 10 reps - 5" hold - Hooklying Small March  - 2 x daily - 7 x weekly - 1 sets - 10 reps - 5" hold  ASSESSMENT:  CLINICAL IMPRESSION: Patient with calf soreness which decreases with standing gastroc stretch. Continued with LE strengthening exercises which are tolerated well. Began additional functional strengthening which is performed with good mechanics with bilateral UE support. Fatigue rating of 8/10 following. Began BAPS for ankle motor control.  Patient will continue to benefit from physical therapy in order to improve function and reduce impairment.   OBJECTIVE IMPAIRMENTS: decreased activity tolerance, decreased balance, decreased ROM, decreased strength, increased fascial restrictions, impaired flexibility, and pain.   ACTIVITY LIMITATIONS: carrying, lifting, standing, squatting, stairs, and locomotion level  PARTICIPATION LIMITATIONS: cleaning, community activity, and yard work  PERSONAL FACTORS: 1 comorbidity: prostate cancer  are also affecting patient's functional outcome.   REHAB POTENTIAL: Good  CLINICAL DECISION MAKING: Stable/uncomplicated  EVALUATION COMPLEXITY: Low   GOALS: Goals reviewed with patient? No  SHORT TERM GOALS: Target date: 02/09/23 PT to be I in HEP to allow pain to decrease to no greater than an 8/10 Baseline: Goal status: INITIAL  2.  PT strength to increase 1/2 grade to allow pt to be able to be able to come sit to stand from a chair without UE assist.  Baseline:  Goal status: INITIAL  3.  Pt to be able to single leg stance on Rt LE for 5 seconds to improve stability of gait.  Baseline:  Goal status: INITIAL   LONG TERM GOALS: Target date: 03/02/23  PT to be I in advanced  HEP to allow pain to decrease to no greater than a 6/10 Baseline:  Goal status: INITIAL  2.    PT strength to increase 1 grade to allow pt to be able to go up and down 4 steps in a reciprocal manner. Baseline:  Goal status: INITIAL  3.  Pt to be able to be on his feet for 10 minutes before pain increases.  Baseline:  Goal status: INITIAL   PLAN:  PT FREQUENCY: 2x/week  PT DURATION: 12 weeks  PLANNED INTERVENTIONS: Therapeutic exercises, Therapeutic activity, Neuromuscular re-education, Balance training, Gait training, Patient/Family education, Self Care, and Joint mobilization  PLAN FOR NEXT SESSION: continue with improving strength, ROM and balance .  7:34 AM, 01/29/23 Wyman Songster PT, DPT Physical Therapist at [facility]

## 2023-02-01 ENCOUNTER — Encounter (HOSPITAL_COMMUNITY): Payer: Self-pay | Admitting: Physical Therapy

## 2023-02-01 ENCOUNTER — Ambulatory Visit (HOSPITAL_COMMUNITY): Payer: Medicare HMO | Admitting: Physical Therapy

## 2023-02-01 DIAGNOSIS — R2681 Unsteadiness on feet: Secondary | ICD-10-CM

## 2023-02-01 DIAGNOSIS — M25571 Pain in right ankle and joints of right foot: Secondary | ICD-10-CM | POA: Diagnosis not present

## 2023-02-01 DIAGNOSIS — M25561 Pain in right knee: Secondary | ICD-10-CM | POA: Diagnosis not present

## 2023-02-01 DIAGNOSIS — G8929 Other chronic pain: Secondary | ICD-10-CM

## 2023-02-01 NOTE — Therapy (Signed)
OUTPATIENT PHYSICAL THERAPY TREATMENT   Patient Name: Kevin [name] MRN: [id number] DOB:12-31-1935, 87 y.o., male Today's Date: 02/01/2023  END OF SESSION:  PT End of Session - 02/01/23 0733     Visit Number 5    Number of Visits 12    Date for PT Re-Evaluation 03/02/23    Authorization Type [employer]    Progress Note Due on Visit 10    PT Start Time 0733    PT Stop Time 0811    PT Time Calculation (min) 38 min    Activity Tolerance Patient tolerated treatment well    Behavior During Therapy [facility] for tasks assessed/performed             Past Medical History:  Diagnosis Date   Anxiety disorder, unspecified    Arthritis    Cancer (HCC)    prostate   Cataract    Edema    Erectile dysfunction    Glaucoma    History of blood in urine    History of cataract surgery 09/26/2015   Both Eyes by Dr.Hunt.   History of colonoscopy 09/26/2007   Dr.LeBaur   Indigestion    Knee pain    Pain in unspecified hip    Prostate cancer [facility])    Urinary incontinence    Past Surgical History:  Procedure Laterality Date   CATARACT EXTRACTION W/PHACO Right 01/20/2016   Procedure: CATARACT EXTRACTION PHACO AND INTRAOCULAR LENS PLACEMENT (IOC);  Surgeon: Gemma Payor, MD;  Location: [facility];  Service: Ophthalmology;  Laterality: Right;  CDE 15.42   CATARACT EXTRACTION W/PHACO Left 03/06/2016   Procedure: CATARACT EXTRACTION PHACO AND INTRAOCULAR LENS PLACEMENT LEFT EYE;  CDE:  15.81;  Surgeon: Gemma Payor, MD;  Location: [facility];  Service: Ophthalmology;  Laterality: Left;   CHOLECYSTECTOMY  2011   COLONOSCOPY  07/03/2008   FRACTURE SURGERY  09/25/1966   [facility].    GALLBLADDER SURGERY  09/26/2007   Dr.Jenkins    HERNIA REPAIR Right 09/26/1991   Dr.Smith    PROSTATE SURGERY     cancer   SUPRAPUBIC PROSTATECTOMY  09/26/1995   Dr.Wrenn   VASECTOMY  09/25/1972   [facility].    Patient Active Problem List   Diagnosis Date Noted   History of prostate cancer 10/20/2020    Chest pain 09/04/2019   Hematuria 09/04/2019   Dysphagia 09/04/2019   Pain in unspecified hip    Knee pain    Edema    Anxiety disorder, unspecified     PCP: Sharon Seller, NP  REFERRING PROVIDER: Sharon Seller, NP  REFERRING DIAG: M17.0 (ICD-10-CM) - Primary osteoarthritis of both knees  THERAPY DIAG:  Right knee pain Left knee pain Muscle weakness  Rationale for Evaluation and Treatment: Rehabilitation  ONSET DATE: chronic  SUBJECTIVE STATEMENT: Pt states R knee has been hurting and a little swelling.   PERTINENT HISTORY: Prostate cancer, OA PAIN:  Are you having pain? Yes: Pain location:2 in Rt ankle 5 in RT knee;  worst for the ankle is a 10/10; worst Rt knee 10/10; Left knee 2/10 Pain description: hurts  Aggravating factors: weight bearing  Relieving factors: sitting   PRECAUTIONS: None  WEIGHT BEARING RESTRICTIONS: No  FALLS:  Has patient fallen in last 6 months? No  LIVING ENVIRONMENT: Lives with: lives with their family Lives in: House/apartment Stairs: Yes: Internal: 8 steps; on right going up and External: 2 steps; on right going up Has following equipment at home: Single point cane  OCCUPATION: retired  PLOF: Independent  PATIENT GOALS: less pain   NEXT MD VISIT: 6 months  OBJECTIVE:   DIAGNOSTIC FINDINGS: Impression severe arthritis medial compartment right knee with mild varus   PATIENT SURVEYS:  FOTO 40  COGNITION: Overall cognitive status: Within functional limits for tasks assessed     EDEMA:  Slight    LOWER EXTREMITY ROM:  Active ROM Right eval Left eval  Hip flexion    Hip extension    Hip abduction    Hip adduction    Hip internal rotation    Hip external rotation    Knee flexion 108   Knee extension 6   Ankle dorsiflexion    Ankle plantarflexion    Ankle inversion    Ankle eversion     (Blank rows = not tested)  LOWER EXTREMITY MMT:  MMT Right eval Left eval  Hip flexion 2+    Hip  extension 2   Hip abduction 3   Hip adduction    Hip internal rotation    Hip external rotation    Knee flexion    Knee extension 3+ 4-  Ankle dorsiflexion 5 5  Ankle plantarflexion    Ankle inversion    Ankle eversion     (Blank rows = not tested)    FUNCTIONAL TESTS:  30 seconds chair stand test:  4 ( 6 is poor for pt age and sex ) 2 minute walk test: 226 ft with cane increased knee pain and unsteady gait.   Single leg stance:  Rt: 0 , LT:  8    TODAY'S TREATMENT:                                                                                                                              DATE:  02/01/23 Nustep seat 9, level 1 for dynamic warm up and conditioning, cueing for >35 SPM Standing calf stretch on incline board 3 x 30 second holds Standing HR 1 x 20 Standing TR 1 x 20 Tandem stance 2 x 30 second holds bilateral    01/29/23 Standing calf stretch on incline board 3 x 30 second holds Standing HR 1 x 20 Standing TR 1 x 20 Standing hip abduction 1 x 15 bilateral  Standing hip extension 1 x 15 bilateral  Step up 4 inch 1 x 10 bilateral  Lateral step up 4 inch 1 x 10 bilateral  Seated BAPS board L3 - PF/DF, inv/ev, CW/CCW - 1 x 10 each   01/25/23 Standing:  heelraises 20X  Toe raises 20X  Hip abduction 10X each LE  Hip extension 10X each LE  Hamstring stretch bil 10X each Sit to stands no UE 5X2 standard chair  LAQ 10X5" each LE Supine:  quad set 10X5"  SLR 10X each  Bridge 10X   01/24/23 LAQ 10 x 5 second holds Standing HR 1 x 10 Standing TR 1 x 10 Standing hip abduction 1 x 10 bilateral  Standing hip extension 1 x 10 bilateral  Standing hamstring curl 2# 1 x 10 bilateral  Quad set 1 x 10 with 5 second holds bilateral  Bent knee lift x 10     PATIENT EDUCATION:  Education details: HEP Person educated: Patient Education method: Programmer, multimedia, Verbal cues, and Handouts Education comprehension: returned demonstration  HOME EXERCISE PROGRAM: Access  Code: [email or web address] URL: [email or web address]  Date: 01/19/2023 - Seated Knee Extension AROM  - 3 x daily - 7 x weekly - 1 sets - 10 reps - 5" hold - Supine Quadricep Sets  - 3 x daily - 7 x weekly - 1 sets - 10 reps - 5" hold - Supine Heel Slide  - 2 x daily - 7 x weekly - 1 sets - 10 reps - 5" hold - Hooklying Small March  - 2 x daily - 7 x weekly - 1 sets - 10 reps - 5" hold  ASSESSMENT:  CLINICAL IMPRESSION: Patient with increase in knee pain and edema today, began session on nustep for dynamic warm up and conditioning. Continued with LE strengthening exercises which are tolerated well. Frequent HHA required required with tandem stance. C/o dizziness following tandem stance, BP 132/83, HR 51 BPM. Patient will continue to benefit from physical therapy in order to improve function and reduce impairment.   OBJECTIVE IMPAIRMENTS: decreased activity tolerance, decreased balance, decreased ROM, decreased strength, increased fascial restrictions, impaired flexibility, and pain.   ACTIVITY LIMITATIONS: carrying, lifting, standing, squatting, stairs, and locomotion level  PARTICIPATION LIMITATIONS: cleaning, community activity, and yard work  PERSONAL FACTORS: 1 comorbidity: prostate cancer  are also affecting patient's functional outcome.   REHAB POTENTIAL: Good  CLINICAL DECISION MAKING: Stable/uncomplicated  EVALUATION COMPLEXITY: Low   GOALS: Goals reviewed with patient? No  SHORT TERM GOALS: Target date: 02/09/23 PT to be I in HEP to allow pain to decrease to no greater than an 8/10 Baseline: Goal status: INITIAL  2.  PT strength to increase 1/2 grade to allow pt to be able to be able to come sit to stand from a chair without UE assist.  Baseline:  Goal status: INITIAL  3.  Pt to be able to single leg stance on Rt LE for 5 seconds to improve stability of gait.  Baseline:  Goal status: INITIAL   LONG TERM GOALS: Target date: 03/02/23  PT to be I in advanced  HEP  to allow pain to decrease to no greater than a 6/10 Baseline:  Goal status: INITIAL  2.   PT strength to increase 1 grade to allow pt to be able to go up and down 4 steps in a reciprocal manner. Baseline:  Goal status: INITIAL  3.  Pt to be able to be on his feet for 10 minutes before pain increases.  Baseline:  Goal status: INITIAL   PLAN:  PT FREQUENCY: 2x/week  PT DURATION: 12 weeks  PLANNED INTERVENTIONS: Therapeutic exercises, Therapeutic activity, Neuromuscular re-education, Balance training, Gait training, Patient/Family education, Self Care, and Joint mobilization  PLAN FOR NEXT SESSION: continue with improving strength, ROM and balance .  7:34 AM, 02/01/23 Wyman Songster PT, DPT Physical Therapist at [facility]

## 2023-02-05 ENCOUNTER — Encounter (HOSPITAL_COMMUNITY): Payer: Self-pay | Admitting: Physical Therapy

## 2023-02-05 ENCOUNTER — Ambulatory Visit (HOSPITAL_COMMUNITY): Payer: Medicare HMO | Admitting: Physical Therapy

## 2023-02-05 DIAGNOSIS — M25571 Pain in right ankle and joints of right foot: Secondary | ICD-10-CM

## 2023-02-05 DIAGNOSIS — M25561 Pain in right knee: Secondary | ICD-10-CM | POA: Diagnosis not present

## 2023-02-05 DIAGNOSIS — G8929 Other chronic pain: Secondary | ICD-10-CM | POA: Diagnosis not present

## 2023-02-05 DIAGNOSIS — R2681 Unsteadiness on feet: Secondary | ICD-10-CM

## 2023-02-05 NOTE — Therapy (Signed)
OUTPATIENT PHYSICAL THERAPY TREATMENT   Patient Name: Kevin [name] MRN: [id number] DOB:Feb 02, 1936, 87 y.o., male Today's Date: 02/05/2023  END OF SESSION:  PT End of Session - 02/05/23 0733     Visit Number 6    Number of Visits 12    Date for PT Re-Evaluation 03/02/23    Authorization Type [employer]    Progress Note Due on Visit 10    PT Start Time 0732    PT Stop Time 0810    PT Time Calculation (min) 38 min    Activity Tolerance Patient tolerated treatment well    Behavior During Therapy [facility] for tasks assessed/performed             Past Medical History:  Diagnosis Date   Anxiety disorder, unspecified    Arthritis    Cancer (HCC)    prostate   Cataract    Edema    Erectile dysfunction    Glaucoma    History of blood in urine    History of cataract surgery 09/26/2015   Both Eyes by Dr.Hunt.   History of colonoscopy 09/26/2007   Dr.LeBaur   Indigestion    Knee pain    Pain in unspecified hip    Prostate cancer [facility])    Urinary incontinence    Past Surgical History:  Procedure Laterality Date   CATARACT EXTRACTION W/PHACO Right 01/20/2016   Procedure: CATARACT EXTRACTION PHACO AND INTRAOCULAR LENS PLACEMENT (IOC);  Surgeon: Gemma Payor, MD;  Location: [facility];  Service: Ophthalmology;  Laterality: Right;  CDE 15.42   CATARACT EXTRACTION W/PHACO Left 03/06/2016   Procedure: CATARACT EXTRACTION PHACO AND INTRAOCULAR LENS PLACEMENT LEFT EYE;  CDE:  15.81;  Surgeon: Gemma Payor, MD;  Location: [facility];  Service: Ophthalmology;  Laterality: Left;   CHOLECYSTECTOMY  2011   COLONOSCOPY  07/03/2008   FRACTURE SURGERY  09/25/1966   [facility].    GALLBLADDER SURGERY  09/26/2007   Dr.Jenkins    HERNIA REPAIR Right 09/26/1991   Dr.Smith    PROSTATE SURGERY     cancer   SUPRAPUBIC PROSTATECTOMY  09/26/1995   Dr.Wrenn   VASECTOMY  09/25/1972   [facility].    Patient Active Problem List   Diagnosis Date Noted   History of prostate cancer  10/20/2020   Chest pain 09/04/2019   Hematuria 09/04/2019   Dysphagia 09/04/2019   Pain in unspecified hip    Knee pain    Edema    Anxiety disorder, unspecified     PCP: Sharon Seller, NP  REFERRING PROVIDER: Sharon Seller, NP  REFERRING DIAG: M17.0 (ICD-10-CM) - Primary osteoarthritis of both knees  THERAPY DIAG:  Right knee pain Left knee pain Muscle weakness  Rationale for Evaluation and Treatment: Rehabilitation  ONSET DATE: chronic  SUBJECTIVE STATEMENT: Pt states knee has been a little uncomfortable. Still having some dizziness that began last week.  PERTINENT HISTORY: Prostate cancer, OA PAIN:  Are you having pain? Yes: Pain location:0 in Rt ankle 0 in RT knee;  worst for the ankle is a 10/10; worst Rt knee 10/10; Left knee 2/10 Pain description: hurts  Aggravating factors: weight bearing  Relieving factors: sitting   PRECAUTIONS: None  WEIGHT BEARING RESTRICTIONS: No  FALLS:  Has patient fallen in last 6 months? No  LIVING ENVIRONMENT: Lives with: lives with their family Lives in: House/apartment Stairs: Yes: Internal: 8 steps; on right going up and External: 2 steps; on right going up Has following equipment at home:  Single point cane  OCCUPATION: retired  PLOF: Independent  PATIENT GOALS: less pain   NEXT MD VISIT: 6 months  OBJECTIVE:   DIAGNOSTIC FINDINGS: Impression severe arthritis medial compartment right knee with mild varus   PATIENT SURVEYS:  FOTO 40  COGNITION: Overall cognitive status: Within functional limits for tasks assessed     EDEMA:  Slight    LOWER EXTREMITY ROM:  Active ROM Right eval Left eval  Hip flexion    Hip extension    Hip abduction    Hip adduction    Hip internal rotation    Hip external rotation    Knee flexion 108   Knee extension 6   Ankle dorsiflexion    Ankle plantarflexion    Ankle inversion    Ankle eversion     (Blank rows = not tested)  LOWER EXTREMITY MMT:  MMT  Right eval Left eval  Hip flexion 2+    Hip extension 2   Hip abduction 3   Hip adduction    Hip internal rotation    Hip external rotation    Knee flexion    Knee extension 3+ 4-  Ankle dorsiflexion 5 5  Ankle plantarflexion    Ankle inversion    Ankle eversion     (Blank rows = not tested)    FUNCTIONAL TESTS:  30 seconds chair stand test:  4 ( 6 is poor for pt age and sex ) 2 minute walk test: 226 ft with cane increased knee pain and unsteady gait.   Single leg stance:  Rt: 0 , LT:  8    TODAY'S TREATMENT:                                                                                                                              DATE:  02/05/23 Nustep seat 9, level 1 for dynamic warm up and conditioning, cueing for >35 SPM Standing calf stretch on incline board 3 x 30 second holds Standing hip abduction 1 x 15 bilateral  Standing hip extension 1 x 15 bilateral  Mini squat 1 x 10  Step up 4 inch 1 x 10 bilateral  Tandem stance 2 x 30 second holds bilateral Hamstring curl #2 2 x 10 bilateral   02/01/23 Nustep seat 9, level 1 for dynamic warm up and conditioning, cueing for >35 SPM Standing calf stretch on incline board 3 x 30 second holds Standing HR 1 x 20 Standing TR 1 x 20 Tandem stance 2 x 30 second holds bilateral   01/29/23 Standing calf stretch on incline board 3 x 30 second holds Standing HR 1 x 20 Standing TR 1 x 20 Standing hip abduction 1 x 15 bilateral  Standing hip extension 1 x 15 bilateral  Step up 4 inch 1 x 10 bilateral  Lateral step up 4 inch 1 x 10 bilateral  Seated BAPS board L3 - PF/DF, inv/ev, CW/CCW - 1 x 10 each  01/25/23 Standing:  heelraises 20X  Toe raises 20X  Hip abduction 10X each LE  Hip extension 10X each LE  Hamstring stretch bil 10X each Sit to stands no UE 5X2 standard chair  LAQ 10X5" each LE Supine:  quad set 10X5"  SLR 10X each  Bridge 10X   01/24/23 LAQ 10 x 5 second holds Standing HR 1 x 10 Standing TR 1 x  10 Standing hip abduction 1 x 10 bilateral  Standing hip extension 1 x 10 bilateral  Standing hamstring curl 2# 1 x 10 bilateral  Quad set 1 x 10 with 5 second holds bilateral  Bent knee lift x 10     PATIENT EDUCATION:  Education details: HEP Person educated: Patient Education method: Programmer, multimedia, Verbal cues, and Handouts Education comprehension: returned demonstration  HOME EXERCISE PROGRAM: Access Code: [email or web address] URL: [email or web address]  Date: 01/19/2023 - Seated Knee Extension AROM  - 3 x daily - 7 x weekly - 1 sets - 10 reps - 5" hold - Supine Quadricep Sets  - 3 x daily - 7 x weekly - 1 sets - 10 reps - 5" hold - Supine Heel Slide  - 2 x daily - 7 x weekly - 1 sets - 10 reps - 5" hold - Hooklying Small March  - 2 x daily - 7 x weekly - 1 sets - 10 reps - 5" hold  ASSESSMENT:  CLINICAL IMPRESSION: Began session on nustep for dynamic warm up and conditioning. Educated on follow up with PCP for continued dizziness symptoms. Continued with LE strengthening exercises which are tolerated well. Patient requires intermittent cueing for exercise mechanics with good carry over. Patient will continue to benefit from physical therapy in order to improve function and reduce impairment.   OBJECTIVE IMPAIRMENTS: decreased activity tolerance, decreased balance, decreased ROM, decreased strength, increased fascial restrictions, impaired flexibility, and pain.   ACTIVITY LIMITATIONS: carrying, lifting, standing, squatting, stairs, and locomotion level  PARTICIPATION LIMITATIONS: cleaning, community activity, and yard work  PERSONAL FACTORS: 1 comorbidity: prostate cancer  are also affecting patient's functional outcome.   REHAB POTENTIAL: Good  CLINICAL DECISION MAKING: Stable/uncomplicated  EVALUATION COMPLEXITY: Low   GOALS: Goals reviewed with patient? No  SHORT TERM GOALS: Target date: 02/09/23 PT to be I in HEP to allow pain to decrease to no greater than an  8/10 Baseline: Goal status: INITIAL  2.  PT strength to increase 1/2 grade to allow pt to be able to be able to come sit to stand from a chair without UE assist.  Baseline:  Goal status: INITIAL  3.  Pt to be able to single leg stance on Rt LE for 5 seconds to improve stability of gait.  Baseline:  Goal status: INITIAL   LONG TERM GOALS: Target date: 03/02/23  PT to be I in advanced  HEP to allow pain to decrease to no greater than a 6/10 Baseline:  Goal status: INITIAL  2.   PT strength to increase 1 grade to allow pt to be able to go up and down 4 steps in a reciprocal manner. Baseline:  Goal status: INITIAL  3.  Pt to be able to be on his feet for 10 minutes before pain increases.  Baseline:  Goal status: INITIAL   PLAN:  PT FREQUENCY: 2x/week  PT DURATION: 12 weeks  PLANNED INTERVENTIONS: Therapeutic exercises, Therapeutic activity, Neuromuscular re-education, Balance training, Gait training, Patient/Family education, Self Care, and Joint mobilization  PLAN FOR NEXT SESSION: continue with improving strength, ROM and  balance .  7:33 AM, 02/05/23 Wyman Songster PT, DPT Physical Therapist at [facility]

## 2023-02-06 ENCOUNTER — Ambulatory Visit (HOSPITAL_COMMUNITY): Payer: Medicare HMO | Admitting: Physical Therapy

## 2023-02-06 ENCOUNTER — Encounter (HOSPITAL_COMMUNITY): Payer: Self-pay | Admitting: Physical Therapy

## 2023-02-06 DIAGNOSIS — R2681 Unsteadiness on feet: Secondary | ICD-10-CM

## 2023-02-06 DIAGNOSIS — M25571 Pain in right ankle and joints of right foot: Secondary | ICD-10-CM | POA: Diagnosis not present

## 2023-02-06 DIAGNOSIS — M25561 Pain in right knee: Secondary | ICD-10-CM | POA: Diagnosis not present

## 2023-02-06 DIAGNOSIS — G8929 Other chronic pain: Secondary | ICD-10-CM | POA: Diagnosis not present

## 2023-02-06 NOTE — Therapy (Signed)
OUTPATIENT PHYSICAL THERAPY TREATMENT   Patient Name: Kevin [name] MRN: [id number] DOB:[birth year], 87 y.o., male Today's Date: 02/06/2023  END OF SESSION:  PT End of Session - 02/06/23 0736     Visit Number 7    Number of Visits 12    Date for PT Re-Evaluation 03/02/23    Authorization Type [employer]    Progress Note Due on Visit 10    PT Start Time 0735    PT Stop Time 0813    PT Time Calculation (min) 38 min    Activity Tolerance Patient tolerated treatment well    Behavior During Therapy [facility] for tasks assessed/performed             Past Medical History:  Diagnosis Date   Anxiety disorder, unspecified    Arthritis    Cancer (HCC)    prostate   Cataract    Edema    Erectile dysfunction    Glaucoma    History of blood in urine    History of cataract surgery 09/26/2015   Both Eyes by Dr.Hunt.   History of colonoscopy 09/26/2007   Dr.LeBaur   Indigestion    Knee pain    Pain in unspecified hip    Prostate cancer [facility])    Urinary incontinence    Past Surgical History:  Procedure Laterality Date   CATARACT EXTRACTION W/PHACO Right 01/20/2016   Procedure: CATARACT EXTRACTION PHACO AND INTRAOCULAR LENS PLACEMENT (IOC);  Surgeon: Gemma Payor, MD;  Location: [facility];  Service: Ophthalmology;  Laterality: Right;  CDE 15.42   CATARACT EXTRACTION W/PHACO Left 03/06/2016   Procedure: CATARACT EXTRACTION PHACO AND INTRAOCULAR LENS PLACEMENT LEFT EYE;  CDE:  15.81;  Surgeon: Gemma Payor, MD;  Location: [facility];  Service: Ophthalmology;  Laterality: Left;   CHOLECYSTECTOMY  2011   COLONOSCOPY  07/03/2008   FRACTURE SURGERY  09/25/1966   [facility].    GALLBLADDER SURGERY  09/26/2007   Dr.Jenkins    HERNIA REPAIR Right 09/26/1991   Dr.Smith    PROSTATE SURGERY     cancer   SUPRAPUBIC PROSTATECTOMY  09/26/1995   Dr.Wrenn   VASECTOMY  09/25/1972   [facility].    Patient Active Problem List   Diagnosis Date Noted   History of prostate cancer  10/20/2020   Chest pain 09/04/2019   Hematuria 09/04/2019   Dysphagia 09/04/2019   Pain in unspecified hip    Knee pain    Edema    Anxiety disorder, unspecified     PCP: Sharon Seller, NP  REFERRING PROVIDER: Sharon Seller, NP  REFERRING DIAG: M17.0 (ICD-10-CM) - Primary osteoarthritis of both knees  THERAPY DIAG:  Right knee pain Left knee pain Muscle weakness  Rationale for Evaluation and Treatment: Rehabilitation  ONSET DATE: chronic  SUBJECTIVE STATEMENT: Pt states feeling tired today. Knee gave him a fit yesterday and hurt. Dizziness better today so far.   PERTINENT HISTORY: Prostate cancer, OA PAIN:  Are you having pain? Yes: Pain location:0 in Rt ankle 5 in RT knee;  worst for the ankle is a 10/10; worst Rt knee 10/10; Left knee 2/10 Pain description: hurts  Aggravating factors: weight bearing  Relieving factors: sitting   PRECAUTIONS: None  WEIGHT BEARING RESTRICTIONS: No  FALLS:  Has patient fallen in last 6 months? No  LIVING ENVIRONMENT: Lives with: lives with their family Lives in: House/apartment Stairs: Yes: Internal: 8 steps; on right going up and External: 2 steps; on right going up Has following  equipment at home: Single point cane  OCCUPATION: retired  PLOF: Independent  PATIENT GOALS: less pain   NEXT MD VISIT: 6 months  OBJECTIVE:   DIAGNOSTIC FINDINGS: Impression severe arthritis medial compartment right knee with mild varus   PATIENT SURVEYS:  FOTO 40  COGNITION: Overall cognitive status: Within functional limits for tasks assessed     EDEMA:  Slight    LOWER EXTREMITY ROM:  Active ROM Right eval Left eval  Hip flexion    Hip extension    Hip abduction    Hip adduction    Hip internal rotation    Hip external rotation    Knee flexion 108   Knee extension 6   Ankle dorsiflexion    Ankle plantarflexion    Ankle inversion    Ankle eversion     (Blank rows = not tested)  LOWER EXTREMITY MMT:  MMT  Right eval Left eval  Hip flexion 2+    Hip extension 2   Hip abduction 3   Hip adduction    Hip internal rotation    Hip external rotation    Knee flexion    Knee extension 3+ 4-  Ankle dorsiflexion 5 5  Ankle plantarflexion    Ankle inversion    Ankle eversion     (Blank rows = not tested)    FUNCTIONAL TESTS:  30 seconds chair stand test:  4 ( 6 is poor for pt age and sex ) 2 minute walk test: 226 ft with cane increased knee pain and unsteady gait.   Single leg stance:  Rt: 0 , LT:  8    TODAY'S TREATMENT:                                                                                                                              DATE:  02/06/23 Nustep seat 9, level 1 for dynamic warm up and conditioning, cueing for >35 SPM Step taps 4 inch 2x 10 bilateral Standing hip abduction RTB at knees 1 x 10 bilateral  Standing hip extension RTB at knees 1 x 10 bilateral  Seated clam RTB 2 x 10  STS with blue foam on seat 1 x 10  Hamstring curl 3# 2 x 10 bilateral   02/05/23 Nustep seat 9, level 1 for dynamic warm up and conditioning, cueing for >35 SPM Standing calf stretch on incline board 3 x 30 second holds Standing hip abduction 1 x 15 bilateral  Standing hip extension 1 x 15 bilateral  Mini squat 1 x 10  Step up 4 inch 1 x 10 bilateral  Tandem stance 2 x 30 second holds bilateral Hamstring curl #2 2 x 10 bilateral   02/01/23 Nustep seat 9, level 1 for dynamic warm up and conditioning, cueing for >35 SPM Standing calf stretch on incline board 3 x 30 second holds Standing HR 1 x 20 Standing TR 1 x 20 Tandem stance 2 x 30 second holds bilateral  01/29/23 Standing calf stretch on incline board 3 x 30 second holds Standing HR 1 x 20 Standing TR 1 x 20 Standing hip abduction 1 x 15 bilateral  Standing hip extension 1 x 15 bilateral  Step up 4 inch 1 x 10 bilateral  Lateral step up 4 inch 1 x 10 bilateral  Seated BAPS board L3 - PF/DF, inv/ev, CW/CCW - 1 x 10  each   01/25/23 Standing:  heelraises 20X  Toe raises 20X  Hip abduction 10X each LE  Hip extension 10X each LE  Hamstring stretch bil 10X each Sit to stands no UE 5X2 standard chair  LAQ 10X5" each LE Supine:  quad set 10X5"  SLR 10X each  Bridge 10X   01/24/23 LAQ 10 x 5 second holds Standing HR 1 x 10 Standing TR 1 x 10 Standing hip abduction 1 x 10 bilateral  Standing hip extension 1 x 10 bilateral  Standing hamstring curl 2# 1 x 10 bilateral  Quad set 1 x 10 with 5 second holds bilateral  Bent knee lift x 10     PATIENT EDUCATION:  Education details: HEP Person educated: Patient Education method: Programmer, multimedia, Verbal cues, and Handouts Education comprehension: returned demonstration  HOME EXERCISE PROGRAM: Access Code: [email or web address] URL: [email or web address]  02/06/23- Heel Raises with Counter Support  - 1 x daily - 7 x weekly - 2 sets - 10 reps - Toe Raises with Counter Support  - 1 x daily - 7 x weekly - 2 sets - 10 reps - Standing Hip Abduction with Resistance at Ankles and Counter Support  - 1 x daily - 7 x weekly - 2 sets - 10 reps - Standing Hip Extension with Resistance at Ankles and Counter Support  - 1 x daily - 7 x weekly - 2 sets - 10 reps  Date: 01/19/2023 - Seated Knee Extension AROM  - 3 x daily - 7 x weekly - 1 sets - 10 reps - 5" hold - Supine Quadricep Sets  - 3 x daily - 7 x weekly - 1 sets - 10 reps - 5" hold - Supine Heel Slide  - 2 x daily - 7 x weekly - 1 sets - 10 reps - 5" hold - Hooklying Small March  - 2 x daily - 7 x weekly - 1 sets - 10 reps - 5" hold  ASSESSMENT:  CLINICAL IMPRESSION: Patient ambulating with cane too short and in R hand despite R knee pain, has previously done it but brought it to his attention today. Educated on proper [facility] use and patient performs with improving gait mechanics. Began session on nustep for dynamic warm up and conditioning.  Continued with LE strengthening exercises and balance training which  are tolerated well. Patient requires intermittent cueing for exercise mechanics with good carry over. Blue foam on seat required with STS due to BLE weakness. Patient will continue to benefit from physical therapy in order to improve function and reduce impairment.   OBJECTIVE IMPAIRMENTS: decreased activity tolerance, decreased balance, decreased ROM, decreased strength, increased fascial restrictions, impaired flexibility, and pain.   ACTIVITY LIMITATIONS: carrying, lifting, standing, squatting, stairs, and locomotion level  PARTICIPATION LIMITATIONS: cleaning, community activity, and yard work  PERSONAL FACTORS: 1 comorbidity: prostate cancer  are also affecting patient's functional outcome.   REHAB POTENTIAL: Good  CLINICAL DECISION MAKING: Stable/uncomplicated  EVALUATION COMPLEXITY: Low   GOALS: Goals reviewed with patient? No  SHORT TERM GOALS: Target date: 02/09/23 PT to be I in HEP to  allow pain to decrease to no greater than an 8/10 Baseline: Goal status: INITIAL  2.  PT strength to increase 1/2 grade to allow pt to be able to be able to come sit to stand from a chair without UE assist.  Baseline:  Goal status: INITIAL  3.  Pt to be able to single leg stance on Rt LE for 5 seconds to improve stability of gait.  Baseline:  Goal status: INITIAL   LONG TERM GOALS: Target date: 03/02/23  PT to be I in advanced  HEP to allow pain to decrease to no greater than a 6/10 Baseline:  Goal status: INITIAL  2.   PT strength to increase 1 grade to allow pt to be able to go up and down 4 steps in a reciprocal manner. Baseline:  Goal status: INITIAL  3.  Pt to be able to be on his feet for 10 minutes before pain increases.  Baseline:  Goal status: INITIAL   PLAN:  PT FREQUENCY: 2x/week  PT DURATION: 12 weeks  PLANNED INTERVENTIONS: Therapeutic exercises, Therapeutic activity, Neuromuscular re-education, Balance training, Gait training, Patient/Family education, Self  Care, and Joint mobilization  PLAN FOR NEXT SESSION: continue with improving strength, ROM and balance .  7:37 AM, 02/06/23 Wyman Songster PT, DPT Physical Therapist at [facility]

## 2023-02-12 ENCOUNTER — Encounter (HOSPITAL_COMMUNITY): Payer: Self-pay | Admitting: Physical Therapy

## 2023-02-12 ENCOUNTER — Ambulatory Visit (HOSPITAL_COMMUNITY): Payer: Medicare HMO | Admitting: Physical Therapy

## 2023-02-12 DIAGNOSIS — M25561 Pain in right knee: Secondary | ICD-10-CM | POA: Diagnosis not present

## 2023-02-12 DIAGNOSIS — M25571 Pain in right ankle and joints of right foot: Secondary | ICD-10-CM | POA: Diagnosis not present

## 2023-02-12 DIAGNOSIS — R2681 Unsteadiness on feet: Secondary | ICD-10-CM | POA: Diagnosis not present

## 2023-02-12 DIAGNOSIS — G8929 Other chronic pain: Secondary | ICD-10-CM

## 2023-02-12 NOTE — Therapy (Signed)
OUTPATIENT PHYSICAL THERAPY TREATMENT   Patient Name: Kevin [name] MRN: [id number] DOB:06-24-1936, 87 y.o., male Today's Date: 02/12/2023  END OF SESSION:  PT End of Session - 02/12/23 0736     Visit Number 8    Number of Visits 12    Date for PT Re-Evaluation 03/02/23    Authorization Type [employer]    Progress Note Due on Visit 10    PT Start Time 0735    PT Stop Time 0813    PT Time Calculation (min) 38 min    Activity Tolerance Patient tolerated treatment well    Behavior During Therapy [facility] for tasks assessed/performed             Past Medical History:  Diagnosis Date   Anxiety disorder, unspecified    Arthritis    Cancer (HCC)    prostate   Cataract    Edema    Erectile dysfunction    Glaucoma    History of blood in urine    History of cataract surgery 09/26/2015   Both Eyes by Dr.Hunt.   History of colonoscopy 09/26/2007   Dr.LeBaur   Indigestion    Knee pain    Pain in unspecified hip    Prostate cancer [facility])    Urinary incontinence    Past Surgical History:  Procedure Laterality Date   CATARACT EXTRACTION W/PHACO Right 01/20/2016   Procedure: CATARACT EXTRACTION PHACO AND INTRAOCULAR LENS PLACEMENT (IOC);  Surgeon: Gemma Payor, MD;  Location: [facility];  Service: Ophthalmology;  Laterality: Right;  CDE 15.42   CATARACT EXTRACTION W/PHACO Left 03/06/2016   Procedure: CATARACT EXTRACTION PHACO AND INTRAOCULAR LENS PLACEMENT LEFT EYE;  CDE:  15.81;  Surgeon: Gemma Payor, MD;  Location: [facility];  Service: Ophthalmology;  Laterality: Left;   CHOLECYSTECTOMY  2011   COLONOSCOPY  07/03/2008   FRACTURE SURGERY  09/25/1966   [facility].    GALLBLADDER SURGERY  09/26/2007   Dr.Jenkins    HERNIA REPAIR Right 09/26/1991   Dr.Smith    PROSTATE SURGERY     cancer   SUPRAPUBIC PROSTATECTOMY  09/26/1995   Dr.Wrenn   VASECTOMY  09/25/1972   [facility].    Patient Active Problem List   Diagnosis Date Noted   History of prostate cancer  10/20/2020   Chest pain 09/04/2019   Hematuria 09/04/2019   Dysphagia 09/04/2019   Pain in unspecified hip    Knee pain    Edema    Anxiety disorder, unspecified     PCP: Sharon Seller, NP  REFERRING PROVIDER: Sharon Seller, NP  REFERRING DIAG: M17.0 (ICD-10-CM) - Primary osteoarthritis of both knees  THERAPY DIAG:  Right knee pain Left knee pain Muscle weakness  Rationale for Evaluation and Treatment: Rehabilitation  ONSET DATE: chronic  SUBJECTIVE STATEMENT: Pt states cane feels better in R hand vs left. Feeling pretty good but hasn't been doing a whole lot.    PERTINENT HISTORY: Prostate cancer, OA PAIN:  Are you having pain? Yes: Pain location:0 in Rt ankle 0 in RT knee;  worst for the ankle is a 10/10; worst Rt knee 10/10; Left knee 2/10 Pain description: hurts  Aggravating factors: weight bearing  Relieving factors: sitting   PRECAUTIONS: None  WEIGHT BEARING RESTRICTIONS: No  FALLS:  Has patient fallen in last 6 months? No  LIVING ENVIRONMENT: Lives with: lives with their family Lives in: House/apartment Stairs: Yes: Internal: 8 steps; on right going up and External: 2 steps; on right going  up Has following equipment at home: Single point cane  OCCUPATION: retired  PLOF: Independent  PATIENT GOALS: less pain   NEXT MD VISIT: 6 months  OBJECTIVE:   DIAGNOSTIC FINDINGS: Impression severe arthritis medial compartment right knee with mild varus   PATIENT SURVEYS:  FOTO 40  COGNITION: Overall cognitive status: Within functional limits for tasks assessed     EDEMA:  Slight    LOWER EXTREMITY ROM:  Active ROM Right eval Left eval  Hip flexion    Hip extension    Hip abduction    Hip adduction    Hip internal rotation    Hip external rotation    Knee flexion 108   Knee extension 6   Ankle dorsiflexion    Ankle plantarflexion    Ankle inversion    Ankle eversion     (Blank rows = not tested)  LOWER EXTREMITY  MMT:  MMT Right eval Left eval  Hip flexion 2+    Hip extension 2   Hip abduction 3   Hip adduction    Hip internal rotation    Hip external rotation    Knee flexion    Knee extension 3+ 4-  Ankle dorsiflexion 5 5  Ankle plantarflexion    Ankle inversion    Ankle eversion     (Blank rows = not tested)    FUNCTIONAL TESTS:  30 seconds chair stand test:  4 ( 6 is poor for pt age and sex ) 2 minute walk test: 226 ft with cane increased knee pain and unsteady gait.   Single leg stance:  Rt: 0 , LT:  8    TODAY'S TREATMENT:                                                                                                                              DATE:  02/12/23 Nustep seat 9, level 2 for dynamic warm up and conditioning, cueing for >45 SPM Step taps 4 inch 2x 10 bilateral Step up 4 inch 1 x 10 bilateral  Lateral step up 4 inch 1 x 10 bilateral  Hamstring curl 3# 2 x 10 bilateral  STS with blue foam on seat 2 x 10  Lateral stepping 2 plates 1 x 5 bilateral  LAQ 3# 1 x 10 with 5 second holds  02/06/23 Nustep seat 9, level 1 for dynamic warm up and conditioning, cueing for >35 SPM Step taps 4 inch 2x 10 bilateral Standing hip abduction RTB at knees 1 x 10 bilateral  Standing hip extension RTB at knees 1 x 10 bilateral  Seated clam RTB 2 x 10  STS with blue foam on seat 1 x 10  Hamstring curl 3# 2 x 10 bilateral   02/05/23 Nustep seat 9, level 1 for dynamic warm up and conditioning, cueing for >35 SPM Standing calf stretch on incline board 3 x 30 second holds Standing hip abduction 1 x 15 bilateral  Standing hip extension  1 x 15 bilateral  Mini squat 1 x 10  Step up 4 inch 1 x 10 bilateral  Tandem stance 2 x 30 second holds bilateral Hamstring curl #2 2 x 10 bilateral   02/01/23 Nustep seat 9, level 1 for dynamic warm up and conditioning, cueing for >35 SPM Standing calf stretch on incline board 3 x 30 second holds Standing HR 1 x 20 Standing TR 1 x 20 Tandem stance  2 x 30 second holds bilateral   01/29/23 Standing calf stretch on incline board 3 x 30 second holds Standing HR 1 x 20 Standing TR 1 x 20 Standing hip abduction 1 x 15 bilateral  Standing hip extension 1 x 15 bilateral  Step up 4 inch 1 x 10 bilateral  Lateral step up 4 inch 1 x 10 bilateral  Seated BAPS board L3 - PF/DF, inv/ev, CW/CCW - 1 x 10 each     PATIENT EDUCATION:  Education details: HEP Person educated: Patient Education method: Programmer, multimedia, Verbal cues, and Handouts Education comprehension: returned demonstration  HOME EXERCISE PROGRAM: Access Code: [email or web address] URL: [email or web address]  02/06/23- Heel Raises with Counter Support  - 1 x daily - 7 x weekly - 2 sets - 10 reps - Toe Raises with Counter Support  - 1 x daily - 7 x weekly - 2 sets - 10 reps - Standing Hip Abduction with Resistance at Ankles and Counter Support  - 1 x daily - 7 x weekly - 2 sets - 10 reps - Standing Hip Extension with Resistance at Ankles and Counter Support  - 1 x daily - 7 x weekly - 2 sets - 10 reps  Date: 01/19/2023 - Seated Knee Extension AROM  - 3 x daily - 7 x weekly - 1 sets - 10 reps - 5" hold - Supine Quadricep Sets  - 3 x daily - 7 x weekly - 1 sets - 10 reps - 5" hold - Supine Heel Slide  - 2 x daily - 7 x weekly - 1 sets - 10 reps - 5" hold - Hooklying Small March  - 2 x daily - 7 x weekly - 1 sets - 10 reps - 5" hold  ASSESSMENT:  CLINICAL IMPRESSION: Began session on nustep for dynamic warm up and conditioning.  Continued with LE strengthening exercises and balance training which are tolerated well. Improving mechanics with step up exercise. Able to perform additional reps of STS with improving functional strength and endurance. Patient will continue to benefit from physical therapy in order to improve function and reduce impairment.   OBJECTIVE IMPAIRMENTS: decreased activity tolerance, decreased balance, decreased ROM, decreased strength, increased fascial  restrictions, impaired flexibility, and pain.   ACTIVITY LIMITATIONS: carrying, lifting, standing, squatting, stairs, and locomotion level  PARTICIPATION LIMITATIONS: cleaning, community activity, and yard work  PERSONAL FACTORS: 1 comorbidity: prostate cancer  are also affecting patient's functional outcome.   REHAB POTENTIAL: Good  CLINICAL DECISION MAKING: Stable/uncomplicated  EVALUATION COMPLEXITY: Low   GOALS: Goals reviewed with patient? No  SHORT TERM GOALS: Target date: 02/09/23 PT to be I in HEP to allow pain to decrease to no greater than an 8/10 Baseline: Goal status: INITIAL  2.  PT strength to increase 1/2 grade to allow pt to be able to be able to come sit to stand from a chair without UE assist.  Baseline:  Goal status: INITIAL  3.  Pt to be able to single leg stance on Rt LE for 5 seconds to improve stability of  gait.  Baseline:  Goal status: INITIAL   LONG TERM GOALS: Target date: 03/02/23  PT to be I in advanced  HEP to allow pain to decrease to no greater than a 6/10 Baseline:  Goal status: INITIAL  2.   PT strength to increase 1 grade to allow pt to be able to go up and down 4 steps in a reciprocal manner. Baseline:  Goal status: INITIAL  3.  Pt to be able to be on his feet for 10 minutes before pain increases.  Baseline:  Goal status: INITIAL   PLAN:  PT FREQUENCY: 2x/week  PT DURATION: 12 weeks  PLANNED INTERVENTIONS: Therapeutic exercises, Therapeutic activity, Neuromuscular re-education, Balance training, Gait training, Patient/Family education, Self Care, and Joint mobilization  PLAN FOR NEXT SESSION: continue with improving strength, ROM and balance .  7:37 AM, 02/12/23 Wyman Songster PT, DPT Physical Therapist at [facility]

## 2023-02-14 ENCOUNTER — Encounter (HOSPITAL_COMMUNITY): Payer: Self-pay | Admitting: Physical Therapy

## 2023-02-14 ENCOUNTER — Ambulatory Visit (HOSPITAL_COMMUNITY): Payer: Medicare HMO | Admitting: Physical Therapy

## 2023-02-14 DIAGNOSIS — G8929 Other chronic pain: Secondary | ICD-10-CM | POA: Diagnosis not present

## 2023-02-14 DIAGNOSIS — M25571 Pain in right ankle and joints of right foot: Secondary | ICD-10-CM | POA: Diagnosis not present

## 2023-02-14 DIAGNOSIS — M25561 Pain in right knee: Secondary | ICD-10-CM | POA: Diagnosis not present

## 2023-02-14 DIAGNOSIS — R2681 Unsteadiness on feet: Secondary | ICD-10-CM

## 2023-02-14 NOTE — Therapy (Signed)
OUTPATIENT PHYSICAL THERAPY TREATMENT   Patient Name: Kevin [name] MRN: [id number] DOB:10/14/1935, 87 y.o., male Today's Date: 02/14/2023  END OF SESSION:  PT End of Session - 02/14/23 0730     Visit Number 9    Number of Visits 12    Date for PT Re-Evaluation 03/02/23    Authorization Type [employer]et[employer]    Progress Note Due on Visit 10    PT Start Time 0730    PT Stop Time 0808    PT Time Calculation (min) 38 min    Activity Tolerance Patient tolerated treatment well    Behavior During Therapy [facility] for tasks assessed/performed             Past Medical History:  Diagnosis Date   Anxiety disorder, unspecified    Arthritis    Cancer (HCC)    prostate   Cataract    Edema    Erectile dysfunction    Glaucoma    History of blood in urine    History of cataract surgery 09/26/2015   Both Eyes by Dr.Hunt.   History of colonoscopy 09/26/2007   Dr.LeBaur   Indigestion    Knee pain    Pain in unspecified hip    Prostate cancer [facility])    Urinary incontinence    Past Surgical History:  Procedure Laterality Date   CATARACT EXTRACTION W/PHACO Right 01/20/2016   Procedure: CATARACT EXTRACTION PHACO AND INTRAOCULAR LENS PLACEMENT (IOC);  Surgeon: Gemma Payor, MD;  Location: [facility];  Service: Ophthalmology;  Laterality: Right;  CDE 15.42   CATARACT EXTRACTION W/PHACO Left 03/06/2016   Procedure: CATARACT EXTRACTION PHACO AND INTRAOCULAR LENS PLACEMENT LEFT EYE;  CDE:  15.81;  Surgeon: Gemma Payor, MD;  Location: [facility];  Service: Ophthalmology;  Laterality: Left;   CHOLECYSTECTOMY  2011   COLONOSCOPY  07/03/2008   FRACTURE SURGERY  09/25/1966   [facility].    GALLBLADDER SURGERY  09/26/2007   Dr.Jenkins    HERNIA REPAIR Right 09/26/1991   Dr.Smith    PROSTATE SURGERY     cancer   SUPRAPUBIC PROSTATECTOMY  09/26/1995   Dr.Wrenn   VASECTOMY  09/25/1972   [facility].    Patient Active Problem List   Diagnosis Date Noted   History of prostate cancer  10/20/2020   Chest pain 09/04/2019   Hematuria 09/04/2019   Dysphagia 09/04/2019   Pain in unspecified hip    Knee pain    Edema    Anxiety disorder, unspecified     PCP: Sharon Seller, NP  REFERRING PROVIDER: Sharon Seller, NP  REFERRING DIAG: M17.0 (ICD-10-CM) - Primary osteoarthritis of both knees  THERAPY DIAG:  Right knee pain Left knee pain Muscle weakness  Rationale for Evaluation and Treatment: Rehabilitation  ONSET DATE: chronic  SUBJECTIVE STATEMENT: Pt states knees feeling pretty good until he had to get up. Whole knee is hurting today. Pain goes and comes, it doesn't bother him as much laying in bed as it used to.   PERTINENT HISTORY: Prostate cancer, OA PAIN:  Are you having pain? Yes: Pain location:0 in Rt ankle 6 in RT knee;  worst for the ankle is a 10/10; worst Rt knee 10/10; Left knee 2/10 Pain description: hurts  Aggravating factors: weight bearing  Relieving factors: sitting   PRECAUTIONS: None  WEIGHT BEARING RESTRICTIONS: No  FALLS:  Has patient fallen in last 6 months? No  LIVING ENVIRONMENT: Lives with: lives with their family Lives in: House/apartment Stairs: Yes: Internal:  8 steps; on right going up and External: 2 steps; on right going up Has following equipment at home: Single point cane  OCCUPATION: retired  PLOF: Independent  PATIENT GOALS: less pain   NEXT MD VISIT: 6 months  OBJECTIVE:   DIAGNOSTIC FINDINGS: Impression severe arthritis medial compartment right knee with mild varus   PATIENT SURVEYS:  FOTO 40  COGNITION: Overall cognitive status: Within functional limits for tasks assessed     EDEMA:  Slight    LOWER EXTREMITY ROM:  Active ROM Right eval Left eval  Hip flexion    Hip extension    Hip abduction    Hip adduction    Hip internal rotation    Hip external rotation    Knee flexion 108   Knee extension 6   Ankle dorsiflexion    Ankle plantarflexion    Ankle inversion    Ankle  eversion     (Blank rows = not tested)  LOWER EXTREMITY MMT:  MMT Right eval Left eval  Hip flexion 2+    Hip extension 2   Hip abduction 3   Hip adduction    Hip internal rotation    Hip external rotation    Knee flexion    Knee extension 3+ 4-  Ankle dorsiflexion 5 5  Ankle plantarflexion    Ankle inversion    Ankle eversion     (Blank rows = not tested)    FUNCTIONAL TESTS:  30 seconds chair stand test:  4 ( 6 is poor for pt age and sex ) 2 minute walk test: 226 ft with cane increased knee pain and unsteady gait.   Single leg stance:  Rt: 0 , LT:  8    TODAY'S TREATMENT:                                                                                                                              DATE:  02/14/23 Nustep seat 9, level 2 for dynamic warm up and conditioning, cueing for >50 SPM Step taps 4 inch 2x 10 bilateral Step up 4 inch 1 x 10 bilateral  Lateral step up 4 inch 1 x 10 bilateral  STS with blue foam on seat 2 x 10  Seated hamstring isometric 1 x 10 with 5 second holds LAQ 3# 1 x 10 with 5 second holds  02/12/23 Nustep seat 9, level 2 for dynamic warm up and conditioning, cueing for >45 SPM Step taps 4 inch 2x 10 bilateral Step up 4 inch 1 x 10 bilateral  Lateral step up 4 inch 1 x 10 bilateral  Hamstring curl 3# 2 x 10 bilateral  STS with blue foam on seat 2 x 10  Lateral stepping 2 plates 1 x 5 bilateral  LAQ 3# 1 x 10 with 5 second holds  02/06/23 Nustep seat 9, level 1 for dynamic warm up and conditioning, cueing for >35 SPM Step taps 4 inch 2x 10 bilateral Standing  hip abduction RTB at knees 1 x 10 bilateral  Standing hip extension RTB at knees 1 x 10 bilateral  Seated clam RTB 2 x 10  STS with blue foam on seat 1 x 10  Hamstring curl 3# 2 x 10 bilateral   02/05/23 Nustep seat 9, level 1 for dynamic warm up and conditioning, cueing for >35 SPM Standing calf stretch on incline board 3 x 30 second holds Standing hip abduction 1 x 15  bilateral  Standing hip extension 1 x 15 bilateral  Mini squat 1 x 10  Step up 4 inch 1 x 10 bilateral  Tandem stance 2 x 30 second holds bilateral Hamstring curl #2 2 x 10 bilateral      PATIENT EDUCATION:  Education details: HEP Person educated: Patient Education method: Programmer, multimedia, Verbal cues, and Handouts Education comprehension: returned demonstration  HOME EXERCISE PROGRAM: Access Code: [email or web address] URL: [email or web address]  02/06/23- Heel Raises with Counter Support  - 1 x daily - 7 x weekly - 2 sets - 10 reps - Toe Raises with Counter Support  - 1 x daily - 7 x weekly - 2 sets - 10 reps - Standing Hip Abduction with Resistance at Ankles and Counter Support  - 1 x daily - 7 x weekly - 2 sets - 10 reps - Standing Hip Extension with Resistance at Ankles and Counter Support  - 1 x daily - 7 x weekly - 2 sets - 10 reps  Date: 01/19/2023 - Seated Knee Extension AROM  - 3 x daily - 7 x weekly - 1 sets - 10 reps - 5" hold - Supine Quadricep Sets  - 3 x daily - 7 x weekly - 1 sets - 10 reps - 5" hold - Supine Heel Slide  - 2 x daily - 7 x weekly - 1 sets - 10 reps - 5" hold - Hooklying Small March  - 2 x daily - 7 x weekly - 1 sets - 10 reps - 5" hold  ASSESSMENT:  CLINICAL IMPRESSION: Began session on nustep for dynamic warm up and conditioning and patient able to complete increased step rate.  Continued with LE strengthening exercises and balance training which are tolerated well. Intermittent rest breaks for fatigue. Ended session with seated exercises due to high fatigue of 8/10. Patient will continue to benefit from physical therapy in order to improve function and reduce impairment.   OBJECTIVE IMPAIRMENTS: decreased activity tolerance, decreased balance, decreased ROM, decreased strength, increased fascial restrictions, impaired flexibility, and pain.   ACTIVITY LIMITATIONS: carrying, lifting, standing, squatting, stairs, and locomotion level  PARTICIPATION  LIMITATIONS: cleaning, community activity, and yard work  PERSONAL FACTORS: 1 comorbidity: prostate cancer  are also affecting patient's functional outcome.   REHAB POTENTIAL: Good  CLINICAL DECISION MAKING: Stable/uncomplicated  EVALUATION COMPLEXITY: Low   GOALS: Goals reviewed with patient? No  SHORT TERM GOALS: Target date: 02/09/23 PT to be I in HEP to allow pain to decrease to no greater than an 8/10 Baseline: Goal status: INITIAL  2.  PT strength to increase 1/2 grade to allow pt to be able to be able to come sit to stand from a chair without UE assist.  Baseline:  Goal status: INITIAL  3.  Pt to be able to single leg stance on Rt LE for 5 seconds to improve stability of gait.  Baseline:  Goal status: INITIAL   LONG TERM GOALS: Target date: 03/02/23  PT to be I in advanced  HEP to allow pain to  decrease to no greater than a 6/10 Baseline:  Goal status: INITIAL  2.   PT strength to increase 1 grade to allow pt to be able to go up and down 4 steps in a reciprocal manner. Baseline:  Goal status: INITIAL  3.  Pt to be able to be on his feet for 10 minutes before pain increases.  Baseline:  Goal status: INITIAL   PLAN:  PT FREQUENCY: 2x/week  PT DURATION: 12 weeks  PLANNED INTERVENTIONS: Therapeutic exercises, Therapeutic activity, Neuromuscular re-education, Balance training, Gait training, Patient/Family education, Self Care, and Joint mobilization  PLAN FOR NEXT SESSION: continue with improving strength, ROM and balance .  7:31 AM, 02/14/23 Wyman Songster PT, DPT Physical Therapist at [facility]

## 2023-02-20 ENCOUNTER — Encounter (HOSPITAL_COMMUNITY): Payer: Self-pay | Admitting: Physical Therapy

## 2023-02-20 ENCOUNTER — Ambulatory Visit (HOSPITAL_COMMUNITY): Payer: Medicare HMO | Admitting: Physical Therapy

## 2023-02-20 DIAGNOSIS — M25571 Pain in right ankle and joints of right foot: Secondary | ICD-10-CM

## 2023-02-20 DIAGNOSIS — R2681 Unsteadiness on feet: Secondary | ICD-10-CM | POA: Diagnosis not present

## 2023-02-20 DIAGNOSIS — G8929 Other chronic pain: Secondary | ICD-10-CM | POA: Diagnosis not present

## 2023-02-20 DIAGNOSIS — M25561 Pain in right knee: Secondary | ICD-10-CM | POA: Diagnosis not present

## 2023-02-20 NOTE — Therapy (Signed)
OUTPATIENT PHYSICAL THERAPY TREATMENT   Patient Name: Kevin [name] MRN: [id number] DOB:1935/12/12, 87 y.o., male Today's Date: 02/20/2023  Progress Note   Reporting Period 01/19/23 to 02/20/23   See note below for Objective Data and Assessment of Progress/Goals   END OF SESSION:  PT End of Session - 02/20/23 0737     Visit Number 10    Number of Visits 12    Date for PT Re-Evaluation 03/02/23    Authorization Type [employer]    Progress Note Due on Visit 20    PT Start Time 0736    PT Stop Time 0814    PT Time Calculation (min) 38 min    Activity Tolerance Patient tolerated treatment well    Behavior During Therapy [facility] for tasks assessed/performed             Past Medical History:  Diagnosis Date   Anxiety disorder, unspecified    Arthritis    Cancer (HCC)    prostate   Cataract    Edema    Erectile dysfunction    Glaucoma    History of blood in urine    History of cataract surgery 09/26/2015   Both Eyes by Dr.Hunt.   History of colonoscopy 09/26/2007   Dr.LeBaur   Indigestion    Knee pain    Pain in unspecified hip    Prostate cancer [facility])    Urinary incontinence    Past Surgical History:  Procedure Laterality Date   CATARACT EXTRACTION W/PHACO Right 01/20/2016   Procedure: CATARACT EXTRACTION PHACO AND INTRAOCULAR LENS PLACEMENT (IOC);  Surgeon: Gemma Payor, MD;  Location: [facility];  Service: Ophthalmology;  Laterality: Right;  CDE 15.42   CATARACT EXTRACTION W/PHACO Left 03/06/2016   Procedure: CATARACT EXTRACTION PHACO AND INTRAOCULAR LENS PLACEMENT LEFT EYE;  CDE:  15.81;  Surgeon: Gemma Payor, MD;  Location: [facility];  Service: Ophthalmology;  Laterality: Left;   CHOLECYSTECTOMY  2011   COLONOSCOPY  07/03/2008   FRACTURE SURGERY  09/25/1966   [facility].    GALLBLADDER SURGERY  09/26/2007   Dr.Jenkins    HERNIA REPAIR Right 09/26/1991   Dr.Smith    PROSTATE SURGERY     cancer   SUPRAPUBIC PROSTATECTOMY  09/26/1995   Dr.Wrenn   VASECTOMY   09/25/1972   [facility].    Patient Active Problem List   Diagnosis Date Noted   History of prostate cancer 10/20/2020   Chest pain 09/04/2019   Hematuria 09/04/2019   Dysphagia 09/04/2019   Pain in unspecified hip    Knee pain    Edema    Anxiety disorder, unspecified     PCP: Sharon Seller, NP  REFERRING PROVIDER: Sharon Seller, NP  REFERRING DIAG: M17.0 (ICD-10-CM) - Primary osteoarthritis of both knees  THERAPY DIAG:  Right knee pain Left knee pain Muscle weakness  Rationale for Evaluation and Treatment: Rehabilitation  ONSET DATE: chronic  SUBJECTIVE STATEMENT: Pt states he has been working on HEP. Has been using ladder at home. Knee hurt when he first got up. Knee isn't as painful during the night as it was. Patient states 50% improvement since beginning PT. Symptoms come and go. Pain at worst 5/10 over last few days. Patient states ability to stand/walk for about 15 minutes before limited by symptoms.   PERTINENT HISTORY: Prostate cancer, OA PAIN:  Are you having pain? Yes: Pain location:0 in Rt ankle 1-2/10 in RT knee;  worst for the ankle is a 10/10; worst Rt  knee 10/10; Left knee 2/10 Pain description: hurts  Aggravating factors: weight bearing  Relieving factors: sitting   PRECAUTIONS: None  WEIGHT BEARING RESTRICTIONS: No  FALLS:  Has patient fallen in last 6 months? No  LIVING ENVIRONMENT: Lives with: lives with their family Lives in: House/apartment Stairs: Yes: Internal: 8 steps; on right going up and External: 2 steps; on right going up Has following equipment at home: Single point cane  OCCUPATION: retired  PLOF: Independent  PATIENT GOALS: less pain   NEXT MD VISIT: 6 months  OBJECTIVE:   DIAGNOSTIC FINDINGS: Impression severe arthritis medial compartment right knee with mild varus   PATIENT SURVEYS:  FOTO 40  COGNITION: Overall cognitive status: Within functional limits for tasks assessed     EDEMA:   Slight    LOWER EXTREMITY ROM:  Active ROM Right eval Left eval  Hip flexion    Hip extension    Hip abduction    Hip adduction    Hip internal rotation    Hip external rotation    Knee flexion 108   Knee extension 6   Ankle dorsiflexion    Ankle plantarflexion    Ankle inversion    Ankle eversion     (Blank rows = not tested)  LOWER EXTREMITY MMT:  MMT Right eval Left eval Right 02/20/23 Left 02/20/23  Hip flexion 2+   4- 4  Hip extension 2  4 4   Hip abduction 3  4+ 4+  Hip adduction      Hip internal rotation      Hip external rotation      Knee flexion      Knee extension 3+ 4- 4 5  Ankle dorsiflexion 5 5 5 5   Ankle plantarflexion      Ankle inversion      Ankle eversion       (Blank rows = not tested)    FUNCTIONAL TESTS:  30 seconds chair stand test:  4 ( 6 is poor for pt age and sex ) 2 minute walk test: 226 ft with cane increased knee pain and unsteady gait.   Single leg stance:  Rt: 0 , LT:  8  Reassessment 02/20/23 SLS: R - 4 seconds, L - 7 seconds  TODAY'S TREATMENT:                                                                                                                              DATE:  02/20/23 Recumbent bike 5 minutes rocking for dynamic warm up Reassessment SLS: R - 4 seconds, L - 7 seconds Stairs: 7 inch, step too pattern using LLE; able to complete with alternating pattern with heavy UE support and c/o R knee pain Step up 4 inch 2 x 10 bilateral  Lateral step up 4 inch 2 x 10 bilateral   02/14/23 Nustep seat 9, level 2 for dynamic warm up and conditioning, cueing for >50 SPM Step taps 4 inch 2x 10  bilateral Step up 4 inch 1 x 10 bilateral  Lateral step up 4 inch 1 x 10 bilateral  STS with blue foam on seat 2 x 10  Seated hamstring isometric 1 x 10 with 5 second holds LAQ 3# 1 x 10 with 5 second holds  02/12/23 Nustep seat 9, level 2 for dynamic warm up and conditioning, cueing for >45 SPM Step taps 4 inch 2x 10  bilateral Step up 4 inch 1 x 10 bilateral  Lateral step up 4 inch 1 x 10 bilateral  Hamstring curl 3# 2 x 10 bilateral  STS with blue foam on seat 2 x 10  Lateral stepping 2 plates 1 x 5 bilateral  LAQ 3# 1 x 10 with 5 second holds  02/06/23 Nustep seat 9, level 1 for dynamic warm up and conditioning, cueing for >35 SPM Step taps 4 inch 2x 10 bilateral Standing hip abduction RTB at knees 1 x 10 bilateral  Standing hip extension RTB at knees 1 x 10 bilateral  Seated clam RTB 2 x 10  STS with blue foam on seat 1 x 10  Hamstring curl 3# 2 x 10 bilateral      PATIENT EDUCATION:  Education details: HEP Person educated: Patient Education method: Programmer, multimedia, Verbal cues, and Handouts Education comprehension: returned demonstration  HOME EXERCISE PROGRAM: Access Code: [email or web address] URL: [email or web address]  02/06/23- Heel Raises with Counter Support  - 1 x daily - 7 x weekly - 2 sets - 10 reps - Toe Raises with Counter Support  - 1 x daily - 7 x weekly - 2 sets - 10 reps - Standing Hip Abduction with Resistance at Ankles and Counter Support  - 1 x daily - 7 x weekly - 2 sets - 10 reps - Standing Hip Extension with Resistance at Ankles and Counter Support  - 1 x daily - 7 x weekly - 2 sets - 10 reps  Date: 01/19/2023 - Seated Knee Extension AROM  - 3 x daily - 7 x weekly - 1 sets - 10 reps - 5" hold - Supine Quadricep Sets  - 3 x daily - 7 x weekly - 1 sets - 10 reps - 5" hold - Supine Heel Slide  - 2 x daily - 7 x weekly - 1 sets - 10 reps - 5" hold - Hooklying Small March  - 2 x daily - 7 x weekly - 1 sets - 10 reps - 5" hold  ASSESSMENT:  CLINICAL IMPRESSION: Began session on nustep for dynamic warm up and conditioning. Patient has met 2/3 short term goals and 2/3 long term goals with ability to complete HEP and improvement in symptoms, strength,  activity tolerance, gait, balance, and functional mobility. Remaining goals not met due to continued deficits in symptoms,  strength and balance. Patient has made good progress toward remaining goals. Continued with functional strength and patient demonstrating improving mechanics and strength. Discussed POC and patient will likely d/c at end of current POC. Patient will continue to benefit from skilled physical therapy in order to improve function and reduce impairment.     OBJECTIVE IMPAIRMENTS: decreased activity tolerance, decreased balance, decreased ROM, decreased strength, increased fascial restrictions, impaired flexibility, and pain.   ACTIVITY LIMITATIONS: carrying, lifting, standing, squatting, stairs, and locomotion level  PARTICIPATION LIMITATIONS: cleaning, community activity, and yard work  PERSONAL FACTORS: 1 comorbidity: prostate cancer  are also affecting patient's functional outcome.   REHAB POTENTIAL: Good  CLINICAL DECISION MAKING: Stable/uncomplicated  EVALUATION COMPLEXITY: Low  GOALS: Goals reviewed with patient? No  SHORT TERM GOALS: Target date: 02/09/23 PT to be I in HEP to allow pain to decrease to no greater than an 8/10 Baseline: Goal status: MET  2.  PT strength to increase 1/2 grade to allow pt to be able to be able to come sit to stand from a chair without UE assist.  Baseline:  Goal status: MET  3.  Pt to be able to single leg stance on Rt LE for 5 seconds to improve stability of gait.  Baseline:  Goal status: INITIAL   LONG TERM GOALS: Target date: 03/02/23  PT to be I in advanced  HEP to allow pain to decrease to no greater than a 6/10 Baseline:  Goal status: MET  2.   PT strength to increase 1 grade to allow pt to be able to go up and down 4 steps in a reciprocal manner. Baseline:  Goal status: INITIAL  3.  Pt to be able to be on his feet for 10 minutes before pain increases.  Baseline:  Goal status: MET   PLAN:  PT FREQUENCY: 2x/week  PT DURATION: 6 weeks  PLANNED INTERVENTIONS: Therapeutic exercises, Therapeutic activity, Neuromuscular  re-education, Balance training, Gait training, Patient/Family education, Self Care, and Joint mobilization  PLAN FOR NEXT SESSION: continue with improving strength, ROM and balance .  7:37 AM, 02/20/23 Wyman Songster PT, DPT Physical Therapist at [facility]

## 2023-02-22 ENCOUNTER — Ambulatory Visit (HOSPITAL_COMMUNITY): Payer: Medicare HMO | Admitting: Physical Therapy

## 2023-02-22 ENCOUNTER — Encounter (HOSPITAL_COMMUNITY): Payer: Self-pay | Admitting: Physical Therapy

## 2023-02-22 DIAGNOSIS — G8929 Other chronic pain: Secondary | ICD-10-CM

## 2023-02-22 DIAGNOSIS — M25571 Pain in right ankle and joints of right foot: Secondary | ICD-10-CM | POA: Diagnosis not present

## 2023-02-22 DIAGNOSIS — R2681 Unsteadiness on feet: Secondary | ICD-10-CM | POA: Diagnosis not present

## 2023-02-22 DIAGNOSIS — M25561 Pain in right knee: Secondary | ICD-10-CM | POA: Diagnosis not present

## 2023-02-22 NOTE — Therapy (Signed)
OUTPATIENT PHYSICAL THERAPY TREATMENT   Patient Name: [name] MRN: [id number] DOB:1935/12/18, 87 y.o., male Today's Date: 02/22/2023    END OF SESSION:  PT End of Session - 02/22/23 0732     Visit Number 11    Number of Visits 12    Date for PT Re-Evaluation 03/02/23    Authorization Type [employer]    Progress Note Due on Visit 20    PT Start Time 0732    PT Stop Time 0810    PT Time Calculation (min) 38 min    Activity Tolerance Patient tolerated treatment well    Behavior During Therapy [facility] for tasks assessed/performed             Past Medical History:  Diagnosis Date   Anxiety disorder, unspecified    Arthritis    Cancer (HCC)    prostate   Cataract    Edema    Erectile dysfunction    Glaucoma    History of blood in urine    History of cataract surgery 09/26/2015   Both Eyes by Kevin [name].   History of colonoscopy 09/26/2007   Kevin [name]   Indigestion    Knee pain    Pain in unspecified hip    Prostate cancer [facility])    Urinary incontinence    Past Surgical History:  Procedure Laterality Date   CATARACT EXTRACTION W/PHACO Right 01/20/2016   Procedure: CATARACT EXTRACTION PHACO AND INTRAOCULAR LENS PLACEMENT (IOC);  Surgeon: Kevin Payor, MD;  Location: [facility];  Service: Ophthalmology;  Laterality: Right;  CDE 15.42   CATARACT EXTRACTION W/PHACO Left 03/06/2016   Procedure: CATARACT EXTRACTION PHACO AND INTRAOCULAR LENS PLACEMENT LEFT EYE;  CDE:  15.81;  Surgeon: Kevin Payor, MD;  Location: [facility];  Service: Ophthalmology;  Laterality: Left;   CHOLECYSTECTOMY  2011   COLONOSCOPY  07/03/2008   FRACTURE SURGERY  09/25/1966   [facility].    GALLBLADDER SURGERY  09/26/2007   Kevin [name]    HERNIA REPAIR Right 09/26/1991   Kevin [name]    PROSTATE SURGERY     cancer   SUPRAPUBIC PROSTATECTOMY  09/26/1995   Kevin [name]   VASECTOMY  09/25/1972   [facility].    Patient Active Problem List   Diagnosis Date Noted   History of prostate cancer  10/20/2020   Chest pain 09/04/2019   Hematuria 09/04/2019   Dysphagia 09/04/2019   Pain in unspecified hip    Knee pain    Edema    Anxiety disorder, unspecified     PCP: Kevin Seller, NP  REFERRING PROVIDER: Sharon Seller, NP  REFERRING DIAG: M17.0 (ICD-10-CM) - Primary osteoarthritis of both knees  THERAPY DIAG:  Right knee pain Left knee pain Muscle weakness  Rationale for Evaluation and Treatment: Rehabilitation  ONSET DATE: chronic  SUBJECTIVE STATEMENT: Pt states knee is feeling alright this morning, just got up though.   PERTINENT HISTORY: Prostate cancer, OA PAIN:  Are you having pain? Yes: Pain location:0 in Rt ankle 3/10 in RT knee;  worst for the ankle is a 10/10; worst Rt knee 10/10; Left knee 2/10 Pain description: hurts  Aggravating factors: weight bearing  Relieving factors: sitting   PRECAUTIONS: None  WEIGHT BEARING RESTRICTIONS: No  FALLS:  Has patient fallen in last 6 months? No  LIVING ENVIRONMENT: Lives with: lives with their family Lives in: House/apartment Stairs: Yes: Internal: 8 steps; on right going up and External: 2 steps; on right going up Has following equipment at home: Single  point cane  OCCUPATION: retired  PLOF: Independent  PATIENT GOALS: less pain   NEXT MD VISIT: 6 months  OBJECTIVE:   DIAGNOSTIC FINDINGS: Impression severe arthritis medial compartment right knee with mild varus   PATIENT SURVEYS:  FOTO 40  COGNITION: Overall cognitive status: Within functional limits for tasks assessed     EDEMA:  Slight    LOWER EXTREMITY ROM:  Active ROM Right eval Left eval  Hip flexion    Hip extension    Hip abduction    Hip adduction    Hip internal rotation    Hip external rotation    Knee flexion 108   Knee extension 6   Ankle dorsiflexion    Ankle plantarflexion    Ankle inversion    Ankle eversion     (Blank rows = not tested)  LOWER EXTREMITY MMT:  MMT Right eval Left eval  Right 02/20/23 Left 02/20/23  Hip flexion 2+   4- 4  Hip extension 2  4 4   Hip abduction 3  4+ 4+  Hip adduction      Hip internal rotation      Hip external rotation      Knee flexion      Knee extension 3+ 4- 4 5  Ankle dorsiflexion 5 5 5 5   Ankle plantarflexion      Ankle inversion      Ankle eversion       (Blank rows = not tested)    FUNCTIONAL TESTS:  30 seconds chair stand test:  4 ( 6 is poor for pt age and sex ) 2 minute walk test: 226 ft with cane increased knee pain and unsteady gait.   Single leg stance:  Rt: 0 , LT:  8  Reassessment 02/20/23 SLS: R - 4 seconds, L - 7 seconds  TODAY'S TREATMENT:                                                                                                                              DATE:  02/22/23 Nustep seat 9, level 2 for dynamic warm up and conditioning, cueing for >50 SPM SLS with vectors 5 x 5 second holds bilateral  Step up 6 inch 2 x 10 bilateral  Lateral step up 6 inch 1 x 10 bilateral  STS with blue foam on seat 2 x 10  Standing calf stretch on incline board 3 x 30 second holds Standing HR on incline 1 x 15  02/20/23 Recumbent bike 5 minutes rocking for dynamic warm up Reassessment SLS: R - 4 seconds, L - 7 seconds Stairs: 7 inch, step too pattern using LLE; able to complete with alternating pattern with heavy UE support and c/o R knee pain Step up 4 inch 2 x 10 bilateral  Lateral step up 4 inch 2 x 10 bilateral   02/14/23 Nustep seat 9, level 2 for dynamic warm up and conditioning, cueing for >50 SPM Step taps 4 inch  2x 10 bilateral Step up 4 inch 1 x 10 bilateral  Lateral step up 4 inch 1 x 10 bilateral  STS with blue foam on seat 2 x 10  Seated hamstring isometric 1 x 10 with 5 second holds LAQ 3# 1 x 10 with 5 second holds  02/12/23 Nustep seat 9, level 2 for dynamic warm up and conditioning, cueing for >45 SPM Step taps 4 inch 2x 10 bilateral Step up 4 inch 1 x 10 bilateral  Lateral step up 4 inch 1 x  10 bilateral  Hamstring curl 3# 2 x 10 bilateral  STS with blue foam on seat 2 x 10  Lateral stepping 2 plates 1 x 5 bilateral  LAQ 3# 1 x 10 with 5 second holds  02/06/23 Nustep seat 9, level 1 for dynamic warm up and conditioning, cueing for >35 SPM Step taps 4 inch 2x 10 bilateral Standing hip abduction RTB at knees 1 x 10 bilateral  Standing hip extension RTB at knees 1 x 10 bilateral  Seated clam RTB 2 x 10  STS with blue foam on seat 1 x 10  Hamstring curl 3# 2 x 10 bilateral      PATIENT EDUCATION:  Education details: HEP Person educated: Patient Education method: Programmer, multimedia, Verbal cues, and Handouts Education comprehension: returned demonstration  HOME EXERCISE PROGRAM: Access Code: [email or web address] URL: [email or web address]  02/22/23- Step Up (Mirrored)  - 1 x daily - 7 x weekly - 2 sets - 10 reps - Sit to Stand  - 1 x daily - 7 x weekly - 2 sets - 10 reps  02/06/23- Heel Raises with Counter Support  - 1 x daily - 7 x weekly - 2 sets - 10 reps - Toe Raises with Counter Support  - 1 x daily - 7 x weekly - 2 sets - 10 reps - Standing Hip Abduction with Resistance at Ankles and Counter Support  - 1 x daily - 7 x weekly - 2 sets - 10 reps - Standing Hip Extension with Resistance at Ankles and Counter Support  - 1 x daily - 7 x weekly - 2 sets - 10 reps  Date: 01/19/2023 - Seated Knee Extension AROM  - 3 x daily - 7 x weekly - 1 sets - 10 reps - 5" hold - Supine Quadricep Sets  - 3 x daily - 7 x weekly - 1 sets - 10 reps - 5" hold - Supine Heel Slide  - 2 x daily - 7 x weekly - 1 sets - 10 reps - 5" hold - Hooklying Small March  - 2 x daily - 7 x weekly - 1 sets - 10 reps - 5" hold  ASSESSMENT:  CLINICAL IMPRESSION: Began session on nustep for dynamic warm up and conditioning. Added vectors for single leg balance and muscular endurance which are tolerated well with unilateral HHA. Patient able to complete step ups on increased height step but requires bilateral  UE support for quad weakness. Added functional strengthening exercises to HEP. Moderate fatigue at end of session. Patient will continue to benefit from skilled physical therapy in order to improve function and reduce impairment.     OBJECTIVE IMPAIRMENTS: decreased activity tolerance, decreased balance, decreased ROM, decreased strength, increased fascial restrictions, impaired flexibility, and pain.   ACTIVITY LIMITATIONS: carrying, lifting, standing, squatting, stairs, and locomotion level  PARTICIPATION LIMITATIONS: cleaning, community activity, and yard work  PERSONAL FACTORS: 1 comorbidity: prostate cancer  are also affecting patient's functional outcome.   REHAB  POTENTIAL: Good  CLINICAL DECISION MAKING: Stable/uncomplicated  EVALUATION COMPLEXITY: Low   GOALS: Goals reviewed with patient? No  SHORT TERM GOALS: Target date: 02/09/23 PT to be I in HEP to allow pain to decrease to no greater than an 8/10 Baseline: Goal status: MET  2.  PT strength to increase 1/2 grade to allow pt to be able to be able to come sit to stand from a chair without UE assist.  Baseline:  Goal status: MET  3.  Pt to be able to single leg stance on Rt LE for 5 seconds to improve stability of gait.  Baseline:  Goal status: INITIAL   LONG TERM GOALS: Target date: 03/02/23  PT to be I in advanced  HEP to allow pain to decrease to no greater than a 6/10 Baseline:  Goal status: MET  2.   PT strength to increase 1 grade to allow pt to be able to go up and down 4 steps in a reciprocal manner. Baseline:  Goal status: INITIAL  3.  Pt to be able to be on his feet for 10 minutes before pain increases.  Baseline:  Goal status: MET   PLAN:  PT FREQUENCY: 2x/week  PT DURATION: 6 weeks  PLANNED INTERVENTIONS: Therapeutic exercises, Therapeutic activity, Neuromuscular re-education, Balance training, Gait training, Patient/Family education, Self Care, and Joint mobilization  PLAN FOR NEXT SESSION:  continue with improving strength, ROM and balance .  7:32 AM, 02/22/23 Wyman Songster PT, DPT Physical Therapist at [facility]

## 2023-02-26 ENCOUNTER — Encounter (HOSPITAL_COMMUNITY): Payer: Self-pay | Admitting: Physical Therapy

## 2023-02-26 ENCOUNTER — Ambulatory Visit (HOSPITAL_COMMUNITY): Payer: Medicare HMO | Attending: Nurse Practitioner | Admitting: Physical Therapy

## 2023-02-26 DIAGNOSIS — M25561 Pain in right knee: Secondary | ICD-10-CM | POA: Diagnosis not present

## 2023-02-26 DIAGNOSIS — M25571 Pain in right ankle and joints of right foot: Secondary | ICD-10-CM | POA: Diagnosis not present

## 2023-02-26 DIAGNOSIS — G8929 Other chronic pain: Secondary | ICD-10-CM | POA: Insufficient documentation

## 2023-02-26 DIAGNOSIS — R2681 Unsteadiness on feet: Secondary | ICD-10-CM | POA: Insufficient documentation

## 2023-02-26 NOTE — Therapy (Signed)
OUTPATIENT PHYSICAL THERAPY TREATMENT   Patient Name: Kevin [name] MRN: [id number] DOB:[birth year], 87 y.o., male Today's Date: 02/26/2023  PHYSICAL THERAPY DISCHARGE SUMMARY  Visits from Start of Care: 12  Current functional level related to goals / functional outcomes: See below   Remaining deficits: See below   Education / Equipment: See below   Patient agrees to discharge. Patient goals were met. Patient is being discharged due to being pleased with the current functional level.   END OF SESSION:  PT End of Session - 02/26/23 0732     Visit Number 12    Number of Visits 12    Date for PT Re-Evaluation 03/02/23    Authorization Type [employer]    Progress Note Due on Visit 20    PT Start Time 0732    PT Stop Time 0800    PT Time Calculation (min) 28 min    Activity Tolerance Patient tolerated treatment well    Behavior During Therapy WFL for tasks assessed/performed             Past Medical History:  Diagnosis Date   Anxiety disorder, unspecified    Arthritis    Cancer (HCC)    prostate   Cataract    Edema    Erectile dysfunction    Glaucoma    History of blood in urine    History of cataract surgery 09/26/2015   Both Eyes by Dr.Hunt.   History of colonoscopy 09/26/2007   Dr.LeBaur   Indigestion    Knee pain    Pain in unspecified hip    Prostate cancer [facility])    Urinary incontinence    Past Surgical History:  Procedure Laterality Date   CATARACT EXTRACTION W/PHACO Right 01/20/2016   Procedure: CATARACT EXTRACTION PHACO AND INTRAOCULAR LENS PLACEMENT (IOC);  Surgeon: Gemma Payor, MD;  Location: [facility];  Service: Ophthalmology;  Laterality: Right;  CDE 15.42   CATARACT EXTRACTION W/PHACO Left 03/06/2016   Procedure: CATARACT EXTRACTION PHACO AND INTRAOCULAR LENS PLACEMENT LEFT EYE;  CDE:  15.81;  Surgeon: Gemma Payor, MD;  Location: [facility];  Service: Ophthalmology;  Laterality: Left;   CHOLECYSTECTOMY  2011   COLONOSCOPY  07/03/2008   FRACTURE  SURGERY  09/25/1966   [facility].    GALLBLADDER SURGERY  09/26/2007   Dr.Jenkins    HERNIA REPAIR Right 09/26/1991   Dr.Smith    PROSTATE SURGERY     cancer   SUPRAPUBIC PROSTATECTOMY  09/26/1995   Dr.Wrenn   VASECTOMY  09/25/1972   [facility].    Patient Active Problem List   Diagnosis Date Noted   History of prostate cancer 10/20/2020   Chest pain 09/04/2019   Hematuria 09/04/2019   Dysphagia 09/04/2019   Pain in unspecified hip    Knee pain    Edema    Anxiety disorder, unspecified     PCP: Sharon Seller, NP  REFERRING PROVIDER: Sharon Seller, NP  REFERRING DIAG: M17.0 (ICD-10-CM) - Primary osteoarthritis of both knees  THERAPY DIAG:  Right knee pain Left knee pain Muscle weakness  Rationale for Evaluation and Treatment: Rehabilitation  ONSET DATE: chronic  SUBJECTIVE STATEMENT: Pt states has been doing a lot of walking in the yard. Knee has really hurt this week for some reason on the inner part mostly. Patient states 50% improvement since beginning PT. Knee does feel better though. Able to sleep better since beginning PT.   PERTINENT HISTORY: Prostate cancer, OA PAIN:  Are you having pain?  Yes: Pain location:0 in Rt ankle 0/10 in RT knee Pain description: hurts  Aggravating factors: weight bearing  Relieving factors: sitting   PRECAUTIONS: None  WEIGHT BEARING RESTRICTIONS: No  FALLS:  Has patient fallen in last 6 months? No  LIVING ENVIRONMENT: Lives with: lives with their family Lives in: House/apartment Stairs: Yes: Internal: 8 steps; on right going up and External: 2 steps; on right going up Has following equipment at home: Single point cane  OCCUPATION: retired  PLOF: Independent  PATIENT GOALS: less pain   NEXT MD VISIT: 6 months  OBJECTIVE:   DIAGNOSTIC FINDINGS: Impression severe arthritis medial compartment right knee with mild varus   PATIENT SURVEYS:  FOTO 40  02/26/23 55%  function  COGNITION: Overall cognitive status: Within functional limits for tasks assessed     EDEMA:  Slight    LOWER EXTREMITY ROM:  Active ROM Right eval Left eval  Hip flexion    Hip extension    Hip abduction    Hip adduction    Hip internal rotation    Hip external rotation    Knee flexion 108   Knee extension 6   Ankle dorsiflexion    Ankle plantarflexion    Ankle inversion    Ankle eversion     (Blank rows = not tested)  LOWER EXTREMITY MMT:  MMT Right eval Left eval Right 02/20/23 Left 02/20/23 Right 02/26/23 Left 02/26/23  Hip flexion 2+   4- 4 4 4   Hip extension 2  4 4 4 4   Hip abduction 3  4+ 4+ 4+ 4+  Hip adduction        Hip internal rotation        Hip external rotation        Knee flexion        Knee extension 3+ 4- 4 5 4 5   Ankle dorsiflexion 5 5 5 5 5 5   Ankle plantarflexion        Ankle inversion        Ankle eversion         (Blank rows = not tested)    FUNCTIONAL TESTS:  30 seconds chair stand test:  4 ( 6 is poor for pt age and sex ) 2 minute walk test: 226 ft with cane increased knee pain and unsteady gait.   Single leg stance:  Rt: 0 , LT:  8  Reassessment 02/20/23 SLS: R - 4 seconds, L - 7 seconds  Reassessment 02/26/23 SLS: R - 5 seconds, L - 5 seconds  TODAY'S TREATMENT:                                                                                                                              DATE:  02/26/23 Nustep seat 9, level 2 for dynamic warm up and conditioning, cueing for >50 SPM Reassessment  SLS: R - 5 seconds, L - 5 seconds   02/22/23 Nustep seat 9, level 2 for  dynamic warm up and conditioning, cueing for >50 SPM SLS with vectors 5 x 5 second holds bilateral  Step up 6 inch 2 x 10 bilateral  Lateral step up 6 inch 1 x 10 bilateral  STS with blue foam on seat 2 x 10  Standing calf stretch on incline board 3 x 30 second holds Standing HR on incline 1 x 15  02/20/23 Recumbent bike 5 minutes rocking for dynamic  warm up Reassessment SLS: R - 4 seconds, L - 7 seconds Stairs: 7 inch, step too pattern using LLE; able to complete with alternating pattern with heavy UE support and c/o R knee pain Step up 4 inch 2 x 10 bilateral  Lateral step up 4 inch 2 x 10 bilateral   02/14/23 Nustep seat 9, level 2 for dynamic warm up and conditioning, cueing for >50 SPM Step taps 4 inch 2x 10 bilateral Step up 4 inch 1 x 10 bilateral  Lateral step up 4 inch 1 x 10 bilateral  STS with blue foam on seat 2 x 10  Seated hamstring isometric 1 x 10 with 5 second holds LAQ 3# 1 x 10 with 5 second holds  02/12/23 Nustep seat 9, level 2 for dynamic warm up and conditioning, cueing for >45 SPM Step taps 4 inch 2x 10 bilateral Step up 4 inch 1 x 10 bilateral  Lateral step up 4 inch 1 x 10 bilateral  Hamstring curl 3# 2 x 10 bilateral  STS with blue foam on seat 2 x 10  Lateral stepping 2 plates 1 x 5 bilateral  LAQ 3# 1 x 10 with 5 second holds  02/06/23 Nustep seat 9, level 1 for dynamic warm up and conditioning, cueing for >35 SPM Step taps 4 inch 2x 10 bilateral Standing hip abduction RTB at knees 1 x 10 bilateral  Standing hip extension RTB at knees 1 x 10 bilateral  Seated clam RTB 2 x 10  STS with blue foam on seat 1 x 10  Hamstring curl 3# 2 x 10 bilateral      PATIENT EDUCATION:  Education details: reassessment findings, HEP, returning to PT if needed Person educated: Patient Education method: Explanation, Verbal cues, and Handouts Education comprehension: returned demonstration  HOME EXERCISE PROGRAM: Access Code: [email or web address] URL: [email or web address]  02/22/23- Step Up (Mirrored)  - 1 x daily - 7 x weekly - 2 sets - 10 reps - Sit to Stand  - 1 x daily - 7 x weekly - 2 sets - 10 reps  02/06/23- Heel Raises with Counter Support  - 1 x daily - 7 x weekly - 2 sets - 10 reps - Toe Raises with Counter Support  - 1 x daily - 7 x weekly - 2 sets - 10 reps - Standing Hip Abduction with  Resistance at Ankles and Counter Support  - 1 x daily - 7 x weekly - 2 sets - 10 reps - Standing Hip Extension with Resistance at Ankles and Counter Support  - 1 x daily - 7 x weekly - 2 sets - 10 reps  Date: 01/19/2023 - Seated Knee Extension AROM  - 3 x daily - 7 x weekly - 1 sets - 10 reps - 5" hold - Supine Quadricep Sets  - 3 x daily - 7 x weekly - 1 sets - 10 reps - 5" hold - Supine Heel Slide  - 2 x daily - 7 x weekly - 1 sets - 10 reps - 5" hold - Hooklying Small  March  - 2 x daily - 7 x weekly - 1 sets - 10 reps - 5" hold  ASSESSMENT:  CLINICAL IMPRESSION: Began session on nustep for dynamic warm up and conditioning. Patient has met 3/3 short term goals and 3/3 long term goals with ability to complete HEP and improvement in symptoms, strength,  activity tolerance, gait, balance, and functional mobility. Patient with continued intermittent knee pain although it has improved. Continued R knee weakness and general balance deficits limiting mobility. Patient discharged from physical therapy at this time.   OBJECTIVE IMPAIRMENTS: decreased activity tolerance, decreased balance, decreased ROM, decreased strength, increased fascial restrictions, impaired flexibility, and pain.   ACTIVITY LIMITATIONS: carrying, lifting, standing, squatting, stairs, and locomotion level  PARTICIPATION LIMITATIONS: cleaning, community activity, and yard work  PERSONAL FACTORS: 1 comorbidity: prostate cancer  are also affecting patient's functional outcome.   REHAB POTENTIAL: Good  CLINICAL DECISION MAKING: Stable/uncomplicated  EVALUATION COMPLEXITY: Low   GOALS: Goals reviewed with patient? No  SHORT TERM GOALS: Target date: 02/09/23 PT to be I in HEP to allow pain to decrease to no greater than an 8/10 Baseline: Goal status: MET  2.  PT strength to increase 1/2 grade to allow pt to be able to be able to come sit to stand from a chair without UE assist.  Baseline:  Goal status: MET  3.  Pt to  be able to single leg stance on Rt LE for 5 seconds to improve stability of gait.  Baseline:  Goal status: MET   LONG TERM GOALS: Target date: 03/02/23  PT to be I in advanced  HEP to allow pain to decrease to no greater than a 6/10 Baseline:  Goal status: MET  2.   PT strength to increase 1 grade to allow pt to be able to go up and down 4 steps in a reciprocal manner. Baseline:  Goal status: MET  3.  Pt to be able to be on his feet for 10 minutes before pain increases.  Baseline:  Goal status: MET   PLAN:  PT FREQUENCY: 2x/week  PT DURATION: 6 weeks  PLANNED INTERVENTIONS: Therapeutic exercises, Therapeutic activity, Neuromuscular re-education, Balance training, Gait training, Patient/Family education, Self Care, and Joint mobilization  PLAN FOR NEXT SESSION: n/a  8:02 AM, 02/26/23 Wyman Songster PT, DPT Physical Therapist at [facility]

## 2023-02-28 ENCOUNTER — Encounter (HOSPITAL_COMMUNITY): Payer: Medicare HMO | Admitting: Physical Therapy

## 2023-03-01 DIAGNOSIS — D1801 Hemangioma of skin and subcutaneous tissue: Secondary | ICD-10-CM | POA: Diagnosis not present

## 2023-03-01 DIAGNOSIS — D225 Melanocytic nevi of trunk: Secondary | ICD-10-CM | POA: Diagnosis not present

## 2023-03-01 DIAGNOSIS — L821 Other seborrheic keratosis: Secondary | ICD-10-CM | POA: Diagnosis not present

## 2023-03-05 ENCOUNTER — Encounter (HOSPITAL_COMMUNITY): Payer: Medicare HMO | Admitting: Physical Therapy

## 2023-03-06 ENCOUNTER — Encounter: Payer: Self-pay | Admitting: Nurse Practitioner

## 2023-03-06 ENCOUNTER — Telehealth: Payer: Self-pay | Admitting: *Deleted

## 2023-03-06 ENCOUNTER — Ambulatory Visit (INDEPENDENT_AMBULATORY_CARE_PROVIDER_SITE_OTHER): Payer: Medicare HMO | Admitting: Nurse Practitioner

## 2023-03-06 DIAGNOSIS — Z0000 Encounter for general adult medical examination without abnormal findings: Secondary | ICD-10-CM

## 2023-03-06 NOTE — Patient Instructions (Signed)
  Mr. Kevin [name] , Thank you for taking time to come for your Medicare Wellness Visit. I appreciate your ongoing commitment to your health goals. Please review the following plan we discussed and let me know if I can assist you in the future.    This is a list of the screening recommended for you and due dates:  Health Maintenance  Topic Date Due   DTaP/Tdap/Td vaccine (1 - Tdap) Never done   COVID-19 Vaccine (6 - 2023-24 season) 05/26/2022   Flu Shot  04/26/2023   Medicare Annual Wellness Visit  03/05/2024   Pneumonia Vaccine  Completed   Zoster (Shingles) Vaccine  Completed   HPV Vaccine  Aged Out

## 2023-03-06 NOTE — Telephone Encounter (Signed)
Mr. Kevin [name], Kevin [name] are scheduled for a virtual visit with your provider today.    Just as we do with appointments in the office, we must obtain your consent to participate.  Your consent will be active for this visit and any virtual visit you [name] have with one of our providers in the next 365 days.    If you have a MyChart account, I can also send a copy of this consent to you electronically.  All virtual visits are billed to your insurance company just like a traditional visit in the office.  As this is a virtual visit, video technology does not allow for your provider to perform a traditional examination.  This Chanee Henrickson limit your provider's ability to fully assess your condition.  If your provider identifies any concerns that need to be evaluated in person or the need to arrange testing such as labs, EKG, etc, we will make arrangements to do so.    Although advances in technology are sophisticated, we cannot ensure that it will always work on either your end or our end.  If the connection with a video visit is poor, we Delaney Perona have to switch to a telephone visit.  With either a video or telephone visit, we are not always able to ensure that we have a secure connection.   I need to obtain your verbal consent now.   Are you willing to proceed with your visit today?   Pierce Barocio Swann has provided verbal consent on 03/06/2023 for a virtual visit (video or telephone).   MayBeckey Downing, [place] 03/06/2023  10:48 AM

## 2023-03-06 NOTE — Progress Notes (Signed)
This service is provided via telemedicine  No vital signs collected/recorded due to the encounter was a telemedicine visit.   Location of patient (ex: home, work):  Home  Patient consents to a telephone visit:  Yes  Location of the provider (ex: office, home):  Office [place].   Name of any referring provider:  na  Names of all persons participating in the telemedicine service and their role in the encounter:  Lanae Boast, Patient, Nelda Severe, CMA, Abbey Chatters, NP  Time spent on call:  7:17   Subjective:   Kevin [name] is a 87 y.o. male who presents for Medicare Annual/Subsequent preventive examination.  Review of Systems     Cardiac Risk Factors include: sedentary lifestyle;advanced age (>110men, >43 women);male gender     Objective:    Today's Vitals   03/06/23 1055  PainSc: 5    There is no height or weight on file to calculate BMI.     03/06/2023   10:42 AM 01/19/2023    8:27 AM 03/02/2022    9:51 AM 01/02/2022    9:25 AM 12/07/2021    1:02 PM 09/12/2021    2:13 PM 03/18/2021   11:26 AM  Advanced Directives  Does Patient Have a Medical Advance Directive? Yes Yes Yes Yes Yes Yes Yes  Type of Estate agent of [place];Living will Healthcare Power of [place];Living will Living will Living will Living will Living will Living will  Does patient want to make changes to medical advance directive? No - Patient declined Yes (MAU/Ambulatory/Procedural Areas - Information given) No - Patient declined No - Patient declined No - Patient declined No - Patient declined No - Patient declined  Copy of Healthcare Power of Attorney in Chart? No - copy requested Yes - validated most recent copy scanned in chart (See row information)         Current Medications (verified) Outpatient Encounter Medications as of 03/06/2023  Medication Sig   acetaminophen (TYLENOL) 325 MG tablet Take 650 mg by mouth every 6 (six) hours as needed.   calcium carbonate (TUMS  EX) 750 MG chewable tablet Chew 1 tablet by mouth as needed for heartburn.   diclofenac Sodium (VOLTAREN) 1 % GEL Apply 4 g topically 2 (two) times daily as needed. Apply to right knee and ankle   esomeprazole (NEXIUM) 40 MG capsule Take 1 capsule (40 mg total) by mouth 2 (two) times daily before a meal.   gabapentin (NEURONTIN) 100 MG capsule Take 1 capsule (100 mg total) by mouth at bedtime.   MAGNESIUM PO Take 2 Doses by mouth at bedtime. 250 mg chewable total of 500 mg   Menthol, Topical Analgesic, (BIOFREEZE) 4 % GEL Apply topically daily as needed.   vitamin B-12 (CYANOCOBALAMIN) 1000 MCG tablet Take 1,000 mcg by mouth daily.   No facility-administered encounter medications on file as of 03/06/2023.    Allergies (verified) Patient has no known allergies.   History: Past Medical History:  Diagnosis Date   Anxiety disorder, unspecified    Arthritis    Cancer (HCC)    prostate   Cataract    Edema    Erectile dysfunction    Glaucoma    History of blood in urine    History of cataract surgery 09/26/2015   Both Eyes by Dr.Hunt.   History of colonoscopy 09/26/2007   Dr.LeBaur   Indigestion    Knee pain    Pain in unspecified hip    Prostate cancer (HCC)  Urinary incontinence    Past Surgical History:  Procedure Laterality Date   CATARACT EXTRACTION W/PHACO Right 01/20/2016   Procedure: CATARACT EXTRACTION PHACO AND INTRAOCULAR LENS PLACEMENT (IOC);  Surgeon: Gemma Payor, MD;  Location: [facility];  Service: Ophthalmology;  Laterality: Right;  CDE 15.42   CATARACT EXTRACTION W/PHACO Left 03/06/2016   Procedure: CATARACT EXTRACTION PHACO AND INTRAOCULAR LENS PLACEMENT LEFT EYE;  CDE:  15.81;  Surgeon: Gemma Payor, MD;  Location: [facility];  Service: Ophthalmology;  Laterality: Left;   CHOLECYSTECTOMY  2011   COLONOSCOPY  07/03/2008   FRACTURE SURGERY  09/25/1966   [facility]    GALLBLADDER SURGERY  09/26/2007   Dr.Jenkins    HERNIA REPAIR Right 09/26/1991   Dr.Smith     PROSTATE SURGERY     cancer   SUPRAPUBIC PROSTATECTOMY  09/26/1995   Dr.Wrenn   VASECTOMY  09/25/1972   [facility]    Family History  Problem Relation Age of Onset   Healthy Mother    Hypertension Mother    Dementia Mother    Heart disease Father    Dementia Father    Heart failure Father    Heart disease Brother    Stroke Brother    Hypertension Son    Stroke Brother    Social History   Socioeconomic History   Marital status: Married    Spouse name: Not on file   Number of children: 3   Years of education: Not on file   Highest education level: Not on file  Occupational History   Occupation: retired  Tobacco Use   Smoking status: Former    Packs/day: 0.50    Years: 16.00    Additional pack years: 0.00    Total pack years: 8.00    Types: Cigarettes, Cigars    Quit date: 01/18/2004    Years since quitting: 19.1   Smokeless tobacco: Never  Vaping Use   Vaping Use: Never used  Substance and Sexual Activity   Alcohol use: Yes    Alcohol/week: 1.0 standard drink of alcohol    Types: 1 Cans of beer per week    Comment: 1 beer week   Drug use: No   Sexual activity: Not Currently    Birth control/protection: None  Other Topics Concern   Not on file  Social History Narrative   Tobacco use, amount per day now: Non smoker for 16 years.   Past tobacco use, amount per day: 1/2 Pack of cigarettes a day or 3 cigars.    How many years did you use tobacco: 15 years.   Alcohol use (drinks per week): 0-2 drinks.   Diet: Normal.    Do you drink/eat things with caffeine: Yes.   Marital status:  Married                                What year were you married? 1981   Do you live in a house, apartment, assisted living, condo, trailer, etc.? House.   Is it one or more stories? 2 stories.    How many persons live in your home? 2   Do you have pets in your home?( please list)  Yes 2 dogs, 1 cat.   Highest Level of education completed: High School GED.   Current or  past profession: Estate manager/land agent.    Do you exercise? Not regularly.  Type and how often? Working outside on yard.    Do you have a living will? Yes.   Do you have a DNR form?  Yes.                                 If not, do you want to discuss one?   Do you have signed POA/HPOA forms? Yes.                       If so, please bring to you appointment    Do you have any difficulty bathing or dressing yourself? No.    Do you have difficulty preparing food or eating? No.   Do you have difficulty managing your medications? No.   Do you have any difficulty managing your finances? No.    Do you have any difficulty affording your medications? No.       Social Determinants of Health   Financial Resource Strain: Medium Risk (12/30/2022)   Overall Financial Resource Strain (CARDIA)    Difficulty of Paying Living Expenses: Somewhat hard  Food Insecurity: No Food Insecurity (12/30/2022)   Hunger Vital Sign    Worried About Running Out of Food in the Last Year: Never true    Ran Out of Food in the Last Year: Never true  Transportation Needs: No Transportation Needs (12/30/2022)   PRAPARE - Administrator, Civil Service (Medical): No    Lack of Transportation (Non-Medical): No  Physical Activity: Insufficiently Active (12/30/2022)   Exercise Vital Sign    Days of Exercise per Week: 2 days    Minutes of Exercise per Session: 60 min  Stress: No Stress Concern Present (12/30/2022)   [employer] of Occupational Health - Occupational Stress Questionnaire    Feeling of Stress : Only a little  Social Connections: Socially Integrated (12/30/2022)   Social Connection and Isolation Panel [NHANES]    Frequency of Communication with Friends and Family: Twice a week    Frequency of Social Gatherings with Friends and Family: Once a week    Attends Religious Services: More than 4 times per year    Active Member of [employer] or Organizations: Yes     Attends Engineer, structural: Not on file    Marital Status: Married    Tobacco Counseling Counseling given: Not Answered   Clinical Intake:  Pre-visit preparation completed: Yes  Pain : 0-10 Pain Score: 5  Pain Type: Acute pain Pain Location: Head Pain Orientation: Other (Comment) (surgical wound)     BMI - recorded: 27 Nutritional Status: BMI 25 -29 Overweight  How often do you need to have someone help you when you read instructions, pamphlets, or other written materials from your doctor or pharmacy?: 5 - Always  Diabetic?no         Activities of Daily Living    03/06/2023   10:44 AM 03/02/2023   10:04 AM  In your present state of health, do you have any difficulty performing the following activities:  Hearing? 1 0  Vision? 0 0  Difficulty concentrating or making decisions? 1 1  Walking or climbing stairs? 1 1  Dressing or bathing? 0 0  Doing errands, shopping? 1 1  Preparing Food and eating ? N N  Using the Toilet? N N  In the past six months, have you accidently leaked urine? Y Y  Do you have problems with loss  of bowel control? N Y  Managing your Medications? Y Y  Managing your Finances? N N  Housekeeping or managing your Housekeeping? N N    Patient Care Team: Sharon Seller, NP as PCP - General (Geriatric Medicine) Wendall Stade, MD as Consulting Physician (Cardiology) Bjorn Pippin, MD as Attending Physician (Urology)  Indicate any recent Medical Services you may have received from other than [facility] providers in the past year (date may be approximate).     Assessment:   This is a routine wellness examination for Ryszard.  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 Has been about 5 years since last exam  Dietary issues and exercise activities discussed: Current Exercise Habits: The patient does not participate in regular exercise at present   Goals Addressed   None    Depression Screen    03/06/2023    10:43 AM 01/01/2023    8:15 AM 03/02/2022   10:48 AM 01/30/2022    9:58 AM 02/28/2021    9:51 AM 02/25/2020   10:24 AM 02/13/2020    1:12 PM  PHQ 2/9 Scores  PHQ - 2 Score 0 0 0 4 4 0 0  PHQ- 9 Score  0 0 12 16      Fall Risk    03/06/2023   10:43 AM 03/02/2023   10:04 AM 01/01/2023    8:15 AM 07/03/2022    8:40 AM 03/02/2022   10:43 AM  Fall Risk   Falls in the past year? 0 0 0 0 0  Number falls in past yr: 0 0 0 0 0  Injury with Fall? 0 0 0 0 0  Risk for fall due to :    No Fall Risks No Fall Risks  Follow up     Falls evaluation completed    FALL RISK PREVENTION PERTAINING TO THE HOME:  Any stairs in or around the home? Yes  If so, are there any without handrails? No  Home free of loose throw rugs in walkways, pet beds, electrical cords, etc? Yes  Adequate lighting in your home to reduce risk of falls? Yes   ASSISTIVE DEVICES UTILIZED TO PREVENT FALLS:  Life alert? No  Use of a cane, walker or w/c? Yes  Grab bars in the bathroom? Yes  Shower chair or bench in shower? No  Elevated toilet seat or a handicapped toilet? Yes   TIMED UP AND GO:  Was the test performed? No .    Cognitive Function:      11/24/2022   11:00 AM  Montreal Cognitive Assessment   Visuospatial/ Executive (0/5) 4  Naming (0/3) 1  Attention: Read list of digits (0/2) 2  Attention: Read list of letters (0/1) 0  Attention: Serial 7 subtraction starting at 100 (0/3) 3  Language: Repeat phrase (0/2) 2  Language : Fluency (0/1) 0  Abstraction (0/2) 0  Delayed Recall (0/5) 0  Orientation (0/6) 4  Total 16  Adjusted Score (based on education) 17      03/06/2023   10:45 AM 03/02/2022   10:48 AM 02/28/2021    9:59 AM 02/25/2020   10:09 AM  6CIT Screen  What Year? 0 points 0 points 0 points 0 points  What month? 0 points 0 points 0 points 0 points  What time? 0 points 0 points 0 points 0 points  Count back from 20 0 points 0 points 0 points 0 points  Months in reverse 0 points 0 points 0 points 0 points  Repeat phrase 4 points 2 points 8 points 0 points  Total Score 4 points 2 points 8 points 0 points    Immunizations Immunization History  Administered Date(s) Administered   Fluad Quad(high Dose 65+) 07/03/2022   Influenza Split 07/06/2016, 07/03/2017, 05/27/2019   Influenza, High Dose Seasonal PF 07/20/2020   Influenza-Unspecified 09/25/2018   Moderna Covid-19 Vaccine Bivalent Booster 75yrs & up 11/05/2021   Moderna SARS-COV2 Booster Vaccination 07/22/2020, 05/09/2021   Moderna Sars-Covid-2 Vaccination 10/31/2019, 11/29/2019   PNEUMOCOCCAL CONJUGATE-20 01/01/2023   Pneumococcal Polysaccharide-23 01/02/2017   Zoster Recombinat (Shingrix) 03/21/2021, 12/21/2021    TDAP status: Due, Education has been provided regarding the importance of this vaccine. Advised may receive this vaccine at local pharmacy or Health Dept. Aware to provide a copy of the vaccination record if obtained from local pharmacy or Health Dept. Verbalized acceptance and understanding.  Flu Vaccine status: Up to date  Pneumococcal vaccine status: Up to date  Covid-19 vaccine status: Information provided on how to obtain vaccines.   Qualifies for Shingles Vaccine? Yes   Zostavax completed No   Shingrix Completed?: Yes  Screening Tests Health Maintenance  Topic Date Due   DTaP/Tdap/Td (1 - Tdap) Never done   COVID-19 Vaccine (6 - 2023-24 season) 05/26/2022   INFLUENZA VACCINE  04/26/2023   Medicare Annual Wellness (AWV)  03/05/2024   Pneumonia Vaccine 43+ Years old  Completed   Zoster Vaccines- Shingrix  Completed   HPV VACCINES  Aged Out    Health Maintenance  Health Maintenance Due  Topic Date Due   DTaP/Tdap/Td (1 - Tdap) Never done   COVID-19 Vaccine (6 - 2023-24 season) 05/26/2022    Colorectal cancer screening: No longer required.   Lung Cancer Screening: (Low Dose CT Chest recommended if Age 52-80 years, 30 pack-year currently smoking OR have quit w/in 15years.) does not qualify.   Lung  Cancer Screening Referral: na  Additional Screening:  Hepatitis C Screening: does not qualify  Vision Screening: Recommended annual ophthalmology exams for early detection of glaucoma and other disorders of the eye. Is the patient up to date with their annual eye exam?  Yes  Who is the provider or what is the name of the office in which the patient attends annual eye exams? Needs new one but plans to check around  If pt is not established with a provider, would they like to be referred to a provider to establish care? No .   Dental Screening: Recommended annual dental exams for proper oral hygiene  Community Resource Referral / Chronic Care Management: CRR required this visit?  No   CCM required this visit?  No      Plan:     I have personally reviewed and noted the following in the patient's chart:   Medical and social history Use of alcohol, tobacco or illicit drugs  Current medications and supplements including opioid prescriptions. Patient is not currently taking opioid prescriptions. Functional ability and status Nutritional status Physical activity Advanced directives List of other physicians Hospitalizations, surgeries, and ER visits in previous 12 months Vitals Screenings to include cognitive, depression, and falls Referrals and appointments  In addition, I have reviewed and discussed with patient certain preventive protocols, quality metrics, and best practice recommendations. A written personalized care plan for preventive services as well as general preventive health recommendations were provided to patient.     Sharon Seller, NP   03/06/2023   Virtual Visit via Video Note  I connected with [place]  on 03/06/23 at 11:00 AM EDT by a video enabled telemedicine application and verified that I am speaking with the correct person using two identifiers.  Location: Patient: home Provider: [place]    I discussed the limitations of evaluation and  management by telemedicine and the availability of in person appointments. The patient expressed understanding and agreed to proceed.    I discussed the assessment and treatment plan with the patient. The patient was provided an opportunity to ask questions and all were answered. The patient agreed with the plan and demonstrated an understanding of the instructions.   The patient was advised to call back or seek an in-person evaluation if the symptoms worsen or if the condition fails to improve as anticipated.  I provided 15 minutes of non-face-to-face time during this encounter.  Janene Harvey. Janyth Contes, AGNP Avs printed and mailed.

## 2023-03-07 ENCOUNTER — Encounter (HOSPITAL_COMMUNITY): Payer: Medicare HMO | Admitting: Physical Therapy

## 2023-03-26 DIAGNOSIS — H5203 Hypermetropia, bilateral: Secondary | ICD-10-CM | POA: Diagnosis not present

## 2023-03-26 DIAGNOSIS — H40053 Ocular hypertension, bilateral: Secondary | ICD-10-CM | POA: Diagnosis not present

## 2023-03-26 DIAGNOSIS — Z961 Presence of intraocular lens: Secondary | ICD-10-CM | POA: Diagnosis not present

## 2023-03-26 DIAGNOSIS — H52223 Regular astigmatism, bilateral: Secondary | ICD-10-CM | POA: Diagnosis not present

## 2023-03-26 DIAGNOSIS — H524 Presbyopia: Secondary | ICD-10-CM | POA: Diagnosis not present

## 2023-04-03 NOTE — Progress Notes (Unsigned)
NEUROLOGY FOLLOW UP OFFICE NOTE  Kevin [name] [id number]  Assessment/Plan:   Mild neurocognitive disorder.  Unclear etiology - may be cerebrovascular or related to low B12 but cannot rule out underlying Alzheimer's Episodic tension type headache, not intractable Episodic visual disturbance - consider ocular migraines     For headaches, gabapentin 100mg  at bedtime *** Would monitor memory for now. Limit use of pain relievers to no more than 2 days out of week to prevent risk of rebound or medication-overuse headache. Follow up ***     Subjective:  Kevin [name] is an 87 year old male with arthritis, anxiety, cataract, memory deficits and history of prostate cancer who follows up for headaches and MCI.    UPDATE: B12 from 11/24/2022 was 589.  TSH from 01/01/2023 was 1.18.  MRI of brain without contrast on 12/24/2022 personally reviewed revealed age-related volume loss and minimal chronic small vessel ischemic changes in the cerebral hemispheres but overall unremarkable.    Last visit, started gabapentin 100mg  at bedtime for headaches.  ***   HISTORY: He reports short-term memory problems for about a year and progressively getting worse.  He will often walk into a room and forget what he wanted to get.  He may forget recent conversations with people.  Sometimes he may forget the name of people he knows.  He is independent.  He doesn't drive much but only locally and without disorientation.  He pays the bills without difficulty.  He performs all activities of daily living independently.  Denies formed hallucinations.  Denies nightmares but sometimes wakes up irritated by a dream. His father had some memory problems but was never diagnosed with dementia. TSH 1.22, non-reactive RPR, B12 273. He was advised to start OTC B12 daily which he took for awhile but then stopped.  He was prescribed donepezil but stopped it himself because he thought it interfered with his sleeping.      For several years, he has had headaches.  They are usually left frontal (sometimes spreads to bilateral) 5/10 pressure pain.  Associated photophobia but no nausea, vomiting, phonophobia, or visual disturbance.  Lasts about an hour with Tylenol.  Occurs 2 to 3 days a week.     For a few years, he also has had recurrent episodes of visual disturbance.  It usually occurs while watching TV.  He will suddenly see a dark pattern (looks like a hand) rotating in the left half of his visual field in both eyes.  Typically lasts up to 30 minutes.  No associated headache.  Occurs once a week.  He has history of cataract surgery.  Has not had an eye exam in several years.    PAST MEDICAL HISTORY: Past Medical History:  Diagnosis Date   Anxiety disorder, unspecified    Arthritis    Cancer (HCC)    prostate   Cataract    Edema    Erectile dysfunction    Glaucoma    History of blood in urine    History of cataract surgery 09/26/2015   Both Eyes by Dr.Hunt.   History of colonoscopy 09/26/2007   Dr.LeBaur   Indigestion    Knee pain    Pain in unspecified hip    Prostate cancer [facility])    Urinary incontinence     MEDICATIONS: Current Outpatient Medications on File Prior to Visit  Medication Sig Dispense Refill   acetaminophen (TYLENOL) 325 MG tablet Take 650 mg by mouth every 6 (six) hours as needed.  calcium carbonate (TUMS EX) 750 MG chewable tablet Chew 1 tablet by mouth as needed for heartburn.     diclofenac Sodium (VOLTAREN) 1 % GEL Apply 4 g topically 2 (two) times daily as needed. Apply to right knee and ankle     esomeprazole (NEXIUM) 40 MG capsule Take 1 capsule (40 mg total) by mouth 2 (two) times daily before a meal. 60 capsule 0   gabapentin (NEURONTIN) 100 MG capsule Take 1 capsule (100 mg total) by mouth at bedtime. 30 capsule 5   MAGNESIUM PO Take 2 Doses by mouth at bedtime. 250 mg chewable total of 500 mg     Menthol, Topical Analgesic, (BIOFREEZE) 4 % GEL Apply topically daily  as needed.     vitamin B-12 (CYANOCOBALAMIN) 1000 MCG tablet Take 1,000 mcg by mouth daily.     No current facility-administered medications on file prior to visit.    ALLERGIES: No Known Allergies  FAMILY HISTORY: Family History  Problem Relation Age of Onset   Healthy Mother    Hypertension Mother    Dementia Mother    Heart disease Father    Dementia Father    Heart failure Father    Heart disease Brother    Stroke Brother    Hypertension Son    Stroke Brother       Objective:  *** General: No acute distress.  Patient appears ***-groomed.   Head:  Normocephalic/atraumatic Eyes:  Fundi examined but not visualized Neck: supple, no paraspinal tenderness, full range of motion Heart:  Regular rate and rhythm Lungs:  Clear to auscultation bilaterally Back: No paraspinal tenderness Neurological Exam: alert and oriented.  Speech fluent and not dysarthric, language intact.  CN II-XII intact. Bulk and tone normal, muscle strength 5/5 throughout.  Sensation to light touch intact.  Deep tendon reflexes 2+ throughout, toes downgoing.  Finger to nose testing intact.  Gait normal, Romberg negative.   Shon Millet, DO  CC: ***

## 2023-04-04 ENCOUNTER — Encounter: Payer: Self-pay | Admitting: Neurology

## 2023-04-04 ENCOUNTER — Ambulatory Visit: Payer: Medicare HMO | Admitting: Neurology

## 2023-04-04 VITALS — BP 131/75 | HR 51 | Ht 68.0 in | Wt 181.0 lb

## 2023-04-04 DIAGNOSIS — G44219 Episodic tension-type headache, not intractable: Secondary | ICD-10-CM

## 2023-04-04 DIAGNOSIS — G3184 Mild cognitive impairment, so stated: Secondary | ICD-10-CM

## 2023-04-04 MED ORDER — GABAPENTIN 100 MG PO CAPS: 200.0000 mg | ORAL_CAPSULE | Freq: Every day | ORAL | 5 refills | Status: DC

## 2023-04-04 NOTE — Patient Instructions (Signed)
Increase gabapentin to 2 pills at bedtime.  If headaches not adequately controlled in 2 months, contact me Limit use of pain relievers to no more than 2 days out of week to prevent risk of rebound or medication-overuse headache. Keep headache diarly Retest memory at follow up Follow up in 6 months.

## 2023-04-17 ENCOUNTER — Other Ambulatory Visit: Payer: Self-pay | Admitting: Physician Assistant

## 2023-04-17 DIAGNOSIS — K219 Gastro-esophageal reflux disease without esophagitis: Secondary | ICD-10-CM

## 2023-04-17 DIAGNOSIS — R1310 Dysphagia, unspecified: Secondary | ICD-10-CM

## 2023-07-02 ENCOUNTER — Ambulatory Visit (INDEPENDENT_AMBULATORY_CARE_PROVIDER_SITE_OTHER): Payer: Medicare HMO | Admitting: Nurse Practitioner

## 2023-07-02 ENCOUNTER — Encounter: Payer: Self-pay | Admitting: Nurse Practitioner

## 2023-07-02 VITALS — BP 110/70 | HR 45 | Temp 98.1°F | Ht 68.03 in | Wt 180.4 lb

## 2023-07-02 DIAGNOSIS — K219 Gastro-esophageal reflux disease without esophagitis: Secondary | ICD-10-CM

## 2023-07-02 DIAGNOSIS — K5900 Constipation, unspecified: Secondary | ICD-10-CM | POA: Diagnosis not present

## 2023-07-02 DIAGNOSIS — R001 Bradycardia, unspecified: Secondary | ICD-10-CM | POA: Diagnosis not present

## 2023-07-02 DIAGNOSIS — Z23 Encounter for immunization: Secondary | ICD-10-CM

## 2023-07-02 DIAGNOSIS — I10 Essential (primary) hypertension: Secondary | ICD-10-CM

## 2023-07-02 DIAGNOSIS — R519 Headache, unspecified: Secondary | ICD-10-CM

## 2023-07-02 DIAGNOSIS — R413 Other amnesia: Secondary | ICD-10-CM

## 2023-07-02 DIAGNOSIS — Z8546 Personal history of malignant neoplasm of prostate: Secondary | ICD-10-CM | POA: Diagnosis not present

## 2023-07-02 DIAGNOSIS — M170 Bilateral primary osteoarthritis of knee: Secondary | ICD-10-CM

## 2023-07-02 DIAGNOSIS — G8929 Other chronic pain: Secondary | ICD-10-CM

## 2023-07-02 DIAGNOSIS — R1310 Dysphagia, unspecified: Secondary | ICD-10-CM

## 2023-07-02 NOTE — Patient Instructions (Addendum)
To add miralax 17 gm daily to help with constipation   If too much- take every other day  Taking pills with pudding or applesauce

## 2023-07-02 NOTE — Progress Notes (Signed)
Careteam: Patient Care Team: Sharon Seller, NP as PCP - General (Geriatric Medicine) Wendall Stade, MD as Consulting Physician (Cardiology) Bjorn Pippin, MD as Attending Physician (Urology)  PLACE OF SERVICE:  [facility]  Advanced Directive information Does Patient Have a Medical Advance Directive?: Yes, Type of Advance Directive: Living will, Does patient want to make changes to medical advance directive?: No - Patient declined  No Known Allergies  Chief Complaint  Patient presents with   Medical Management of Chronic Issues    6 month follow-up. Discuss need for td/tdap and covid booster. Flu vaccine today. Patient has a headache.      HPI: Patient is a 87 y.o. male presents for a 51-month follow-up.  Having headaches frequently, neurologist following, taking gabapentin every night and PRN tylenol, triggered by loud noises and lack of sleep.  Feels like knee pain getting worse but using topical treatments and completed PT sessions.  GERD- stable; trying to get an appointment with Dr. Christella Hartigan, due to ongoing issues with  swallowing with pills. Denies trouble swallowing food.  Reports constipation and having to strain to have a bowel movement, states he drinks plenty of water and is taking metamucil.  Sees urologist every 6 months, h/o prostate CA, >20 years. Feels like he has occasional trouble emptying bladder. Denies dysuria.   Review of Systems:  Review of Systems  Constitutional:  Negative for chills, fever and malaise/fatigue.  Respiratory:  Negative for cough and shortness of breath.   Cardiovascular:  Negative for chest pain and palpitations.  Gastrointestinal:  Positive for constipation. Negative for diarrhea, nausea and vomiting.  Genitourinary:  Negative for dysuria, frequency and urgency.       Occasional trouble emptying  Musculoskeletal:  Positive for joint pain (knee pain).  Neurological:  Positive for headaches. Negative for dizziness and weakness.   Psychiatric/Behavioral:  Positive for memory loss. Negative for depression. The patient is not nervous/anxious and does not have insomnia.     Past Medical History:  Diagnosis Date   Anxiety disorder, unspecified    Arthritis    Cancer (HCC)    prostate   Cataract    Edema    Erectile dysfunction    Glaucoma    History of blood in urine    History of cataract surgery 09/26/2015   Both Eyes by Dr.Hunt.   History of colonoscopy 09/26/2007   Dr.LeBaur   Indigestion    Knee pain    Pain in unspecified hip    Prostate cancer [facility])    Urinary incontinence    Past Surgical History:  Procedure Laterality Date   CATARACT EXTRACTION W/PHACO Right 01/20/2016   Procedure: CATARACT EXTRACTION PHACO AND INTRAOCULAR LENS PLACEMENT (IOC);  Surgeon: Gemma Payor, MD;  Location: [facility];  Service: Ophthalmology;  Laterality: Right;  CDE 15.42   CATARACT EXTRACTION W/PHACO Left 03/06/2016   Procedure: CATARACT EXTRACTION PHACO AND INTRAOCULAR LENS PLACEMENT LEFT EYE;  CDE:  15.81;  Surgeon: Gemma Payor, MD;  Location: [facility];  Service: Ophthalmology;  Laterality: Left;   CHOLECYSTECTOMY  2011   COLONOSCOPY  07/03/2008   FRACTURE SURGERY  09/25/1966   [facility].    GALLBLADDER SURGERY  09/26/2007   Dr.Jenkins    HERNIA REPAIR Right 09/26/1991   Dr.Smith    PROSTATE SURGERY     cancer   SUPRAPUBIC PROSTATECTOMY  09/26/1995   Dr.Wrenn   VASECTOMY  09/25/1972   [facility]    Social History:   reports  that he quit smoking about 19 years ago. His smoking use included cigarettes and cigars. He started smoking about 35 years ago. He has a 8 pack-year smoking history. He has never used smokeless tobacco. He reports current alcohol use of about 1.0 standard drink of alcohol per week. He reports that he does not use drugs.  Family History  Problem Relation Age of Onset   Healthy Mother    Hypertension Mother    Dementia Mother    Heart disease Father    Dementia Father     Heart failure Father    Heart disease Brother    Stroke Brother    Hypertension Son    Stroke Brother     Medications: Patient's Medications  New Prescriptions   No medications on file  Previous Medications   ACETAMINOPHEN (TYLENOL) 325 MG TABLET    Take 650 mg by mouth every 6 (six) hours as needed.   CALCIUM CARBONATE (TUMS EX) 750 MG CHEWABLE TABLET    Chew 1 tablet by mouth as needed for heartburn.   DICLOFENAC SODIUM (VOLTAREN) 1 % GEL    Apply 4 g topically 2 (two) times daily as needed. Apply to right knee and ankle   ESOMEPRAZOLE (NEXIUM) 40 MG CAPSULE    Take 1 capsule (40 mg total) by mouth 2 (two) times daily before a meal.   GABAPENTIN (NEURONTIN) 100 MG CAPSULE    Take 2 capsules (200 mg total) by mouth at bedtime.   MAGNESIUM PO    Take 2 Doses by mouth at bedtime. 250 mg chewable total of 500 mg   MENTHOL, TOPICAL ANALGESIC, (BIOFREEZE) 4 % GEL    Apply topically daily as needed.   VITAMIN B-12 (CYANOCOBALAMIN) 1000 MCG TABLET    Take 1,000 mcg by mouth daily.  Modified Medications   No medications on file  Discontinued Medications   No medications on file    Physical Exam:  Vitals:   07/02/23 0816  BP: 110/70  Pulse: (!) 45  Temp: 98.1 F (36.7 C)  TempSrc: Temporal  SpO2: 99%  Weight: 81.8 kg  Height: 5' 8.03" (1.728 m)   Body mass index is 27.41 kg/m. Wt Readings from Last 3 Encounters:  07/02/23 81.8 kg  04/04/23 82.1 kg  01/11/23 83 kg    Physical Exam Constitutional:      Appearance: Normal appearance.  Cardiovascular:     Rate and Rhythm: Regular rhythm. Bradycardia present.     Pulses: Normal pulses.     Heart sounds: Normal heart sounds.  Pulmonary:     Effort: Pulmonary effort is normal.     Breath sounds: Normal breath sounds.  Abdominal:     General: Bowel sounds are normal.     Palpations: Abdomen is soft.  Skin:    General: Skin is warm and dry.  Neurological:     Mental Status: He is alert. Mental status is at baseline.      Gait: Gait abnormal (uses cane).  Psychiatric:        Mood and Affect: Mood normal.     Labs reviewed: Basic Metabolic Panel: Recent Labs    01/01/23 0928  NA 141  K 4.6  CL 106  CO2 30  GLUCOSE 92  BUN 18  CREATININE 1.16  CALCIUM 8.9  TSH 1.18   Liver Function Tests: Recent Labs    01/01/23 0928  AST 17  ALT 15  BILITOT 1.0  PROT 6.4   No results for input(s): "LIPASE", "AMYLASE"  in the last 8760 hours. No results for input(s): "AMMONIA" in the last 8760 hours. CBC: Recent Labs    01/01/23 0928  WBC 5.9  NEUTROABS 3,481  HGB 15.2  HCT 44.5  MCV 88.1  PLT 177   Lipid Panel: No results for input(s): "CHOL", "HDL", "LDLCALC", "TRIG", "CHOLHDL", "LDLDIRECT" in the last 8760 hours. TSH: Recent Labs    01/01/23 0928  TSH 1.18   A1C: No results found for: "HGBA1C"   Assessment/Plan 1. Essential hypertension -Controlled, at goal, <140/90 -Not on medication -Encouraged dietary modifications and physical activity as tolerated  2. Bradycardia Ongoing and stable.  - COMPLETE METABOLIC PANEL WITH GFR - CBC with Differential/Platelet -EKG early this year, bradycardia without abnormal findings -Not on medications that may worsen; not symptomatic -Avoid straining  3. Primary osteoarthritis of both knees -Continue topical treatments -Completed PT sessions 6/3  4. Gastroesophageal reflux disease without esophagitis -Stable, continue omeprazole -Encouraged dietary modifications and physical activity as tolerated  5. Constipation, unspecified constipation type -Add miralax 17gm daily or every other day if too many loose stools -Encouraged proper hydration and added fiber in diet, physical activity as tolerated  6. Chronic nonintractable headache, unspecified headache type -Continue care with neurologist, recent visit 7/10, who recommended increasing gabapentin -Limit PRN tylenol to prevent rebound headache  7. Memory loss -Continue care with  neurologist -No acute changes Continue supportive care  8. History of prostate cancer -Continue care with urologist -Stable, reports some occasional trouble emptying; avoid constipation  9. Dysphagia, unspecified type -Encouraged taking pills with applesauce or pudding; denies dysphagia with meals, no coughing/choking  -Per wife, trying to get appointment with GI  9. Need for influenza vaccination - Flu Vaccine Trivalent High Dose (Fluad)   Return in about 6 months (around 12/31/2023) for routine follow up .  Rollen Sox, FNP-MSN Student -I personally was present during the history, physical exam and medical decision-making activities of this service and have verified that the service and findings are accurately documented in the student's note  Abbey Chatters, NP

## 2023-07-03 LAB — CBC WITH DIFFERENTIAL/PLATELET
Absolute Monocytes: 612 {cells}/uL (ref 200–950)
Basophils Absolute: 72 {cells}/uL (ref 0–200)
Basophils Relative: 1.2 %
Eosinophils Absolute: 78 {cells}/uL (ref 15–500)
Eosinophils Relative: 1.3 %
HCT: 48 % (ref 38.5–50.0)
Hemoglobin: 15.8 g/dL (ref 13.2–17.1)
Lymphs Abs: 2028 {cells}/uL (ref 850–3900)
MCH: 29.9 pg (ref 27.0–33.0)
MCHC: 32.9 g/dL (ref 32.0–36.0)
MCV: 90.7 fL (ref 80.0–100.0)
MPV: 10.1 fL (ref 7.5–12.5)
Monocytes Relative: 10.2 %
Neutro Abs: 3210 {cells}/uL (ref 1500–7800)
Neutrophils Relative %: 53.5 %
Platelets: 179 10*3/uL (ref 140–400)
RBC: 5.29 10*6/uL (ref 4.20–5.80)
RDW: 12.3 % (ref 11.0–15.0)
Total Lymphocyte: 33.8 %
WBC: 6 10*3/uL (ref 3.8–10.8)

## 2023-07-03 LAB — COMPLETE METABOLIC PANEL WITH GFR
AG Ratio: 1.6 (calc) (ref 1.0–2.5)
ALT: 15 U/L (ref 9–46)
AST: 17 U/L (ref 10–35)
Albumin: 4.2 g/dL (ref 3.6–5.1)
Alkaline phosphatase (APISO): 65 U/L (ref 35–144)
BUN/Creatinine Ratio: 12 (calc) (ref 6–22)
BUN: 15 mg/dL (ref 7–25)
CO2: 27 mmol/L (ref 20–32)
Calcium: 9.9 mg/dL (ref 8.6–10.3)
Chloride: 103 mmol/L (ref 98–110)
Creat: 1.27 mg/dL — ABNORMAL HIGH (ref 0.70–1.22)
Globulin: 2.6 g/dL (ref 1.9–3.7)
Glucose, Bld: 98 mg/dL (ref 65–99)
Potassium: 4.6 mmol/L (ref 3.5–5.3)
Sodium: 140 mmol/L (ref 135–146)
Total Bilirubin: 1.2 mg/dL (ref 0.2–1.2)
Total Protein: 6.8 g/dL (ref 6.1–8.1)
eGFR: 56 mL/min/{1.73_m2} — ABNORMAL LOW (ref >=60)

## 2023-08-15 ENCOUNTER — Other Ambulatory Visit: Payer: Self-pay

## 2023-08-15 ENCOUNTER — Emergency Department (HOSPITAL_COMMUNITY)
Admission: EM | Admit: 2023-08-15 | Discharge: 2023-08-15 | Disposition: A | Discharge status: Home / Self Care | Payer: Medicare HMO | Attending: Emergency Medicine | Admitting: Emergency Medicine

## 2023-08-15 ENCOUNTER — Emergency Department (HOSPITAL_COMMUNITY): Payer: Medicare HMO

## 2023-08-15 DIAGNOSIS — G4489 Other headache syndrome: Secondary | ICD-10-CM | POA: Diagnosis not present

## 2023-08-15 DIAGNOSIS — I672 Cerebral atherosclerosis: Secondary | ICD-10-CM | POA: Diagnosis not present

## 2023-08-15 DIAGNOSIS — R29818 Other symptoms and signs involving the nervous system: Secondary | ICD-10-CM | POA: Diagnosis not present

## 2023-08-15 DIAGNOSIS — R519 Headache, unspecified: Secondary | ICD-10-CM | POA: Insufficient documentation

## 2023-08-15 DIAGNOSIS — R42 Dizziness and giddiness: Secondary | ICD-10-CM | POA: Insufficient documentation

## 2023-08-15 DIAGNOSIS — R001 Bradycardia, unspecified: Secondary | ICD-10-CM | POA: Diagnosis not present

## 2023-08-15 DIAGNOSIS — Z743 Need for continuous supervision: Secondary | ICD-10-CM | POA: Diagnosis not present

## 2023-08-15 DIAGNOSIS — R110 Nausea: Secondary | ICD-10-CM | POA: Diagnosis not present

## 2023-08-15 LAB — CBC
HCT: 45.4 % (ref 39.0–52.0)
Hemoglobin: 15.5 g/dL (ref 13.0–17.0)
MCH: 30.1 pg (ref 26.0–34.0)
MCHC: 34.1 g/dL (ref 30.0–36.0)
MCV: 88.2 fL (ref 80.0–100.0)
Platelets: 177 10*3/uL (ref 150–400)
RBC: 5.15 MIL/uL (ref 4.22–5.81)
RDW: 12.3 % (ref 11.5–15.5)
WBC: 8.5 10*3/uL (ref 4.0–10.5)
nRBC: 0 % (ref 0.0–0.2)

## 2023-08-15 LAB — CBG MONITORING, ED: Glucose-Capillary: 100 mg/dL — ABNORMAL HIGH (ref 70–99)

## 2023-08-15 LAB — BASIC METABOLIC PANEL
Anion gap: 7 (ref 5–15)
BUN: 15 mg/dL (ref 8–23)
CO2: 23 mmol/L (ref 22–32)
Calcium: 8.6 mg/dL — ABNORMAL LOW (ref 8.9–10.3)
Chloride: 106 mmol/L (ref 98–111)
Creatinine, Ser: 1.2 mg/dL (ref 0.61–1.24)
GFR, Estimated: 59 mL/min — ABNORMAL LOW (ref >60)
Glucose, Bld: 101 mg/dL — ABNORMAL HIGH (ref 70–99)
Potassium: 3.9 mmol/L (ref 3.5–5.1)
Sodium: 136 mmol/L (ref 135–145)

## 2023-08-15 MED ORDER — ONDANSETRON HCL 4 MG/2ML IJ SOLN
4.0000 mg | Freq: Once | INTRAMUSCULAR | Status: AC
  Administered 2023-08-15: 4 mg via INTRAVENOUS
  Filled 2023-08-15: qty 2

## 2023-08-15 MED ORDER — SODIUM CHLORIDE 0.9 % IV BOLUS: 500.0000 mL | Freq: Once | INTRAVENOUS | Status: DC

## 2023-08-15 MED ORDER — MECLIZINE HCL 25 MG PO TABS: 25.0000 mg | ORAL_TABLET | Freq: Three times a day (TID) | ORAL | 0 refills | Status: DC | PRN

## 2023-08-15 MED ORDER — ONDANSETRON 4 MG PO TBDP: ORAL_TABLET | ORAL | 0 refills | Status: DC

## 2023-08-15 NOTE — ED Triage Notes (Signed)
Pt BIB [facility], From home complaint of dizziness and sensitive to light and nausea. Denises vomiting.Patient's  wife told EMS that Pt pulse is normally low. Hx of migraines.

## 2023-08-15 NOTE — ED Notes (Signed)
Pt returned from CT °

## 2023-08-15 NOTE — Discharge Instructions (Signed)
Follow-up with Dr. Elijah Birk in the next couple weeks for your dizziness and ear problems,   [phone]

## 2023-08-15 NOTE — ED Notes (Signed)
Patient transported to CT 

## 2023-08-15 NOTE — ED Provider Notes (Signed)
[place] [facility] AT [facility] Provider Note   CSN: [id number] Arrival date & time: 08/15/23  1810     History  Chief Complaint  Patient presents with   Dizziness   Headache    Kevin [name] is a 87 y.o. male.  Patient states he had some dizziness couple weeks ago that resolved and then again today he had dizziness lasting for about an hour and had difficulty walking.  He states the room was kind of spinning.  Patient feels much better now  The history is provided by the patient and medical records. No language interpreter was used.  Dizziness Quality:  Head spinning Severity:  Mild Onset quality:  Sudden Timing:  Intermittent Progression:  Resolved Chronicity:  New Context: bending over   Relieved by:  Nothing Worsened by:  Nothing Ineffective treatments:  None tried Associated symptoms: headaches   Associated symptoms: no blood in stool, no chest pain and no diarrhea   Headache Associated symptoms: dizziness   Associated symptoms: no abdominal pain, no back pain, no congestion, no cough, no diarrhea, no fatigue, no seizures and no sinus pressure        Home Medications Prior to Admission medications   Medication Sig Start Date End Date Taking? Authorizing Provider  meclizine (ANTIVERT) 25 MG tablet Take 1 tablet (25 mg total) by mouth 3 (three) times daily as needed for dizziness. 08/15/23  Yes Bethann Berkshire, MD  ondansetron (ZOFRAN-ODT) 4 MG disintegrating tablet 4mg  ODT q4 hours prn nausea/vomit 08/15/23  Yes Bethann Berkshire, MD  acetaminophen (TYLENOL) 325 MG tablet Take 650 mg by mouth every 6 (six) hours as needed.    [provider]  calcium carbonate (TUMS EX) 750 MG chewable tablet Chew 1 tablet by mouth as needed for heartburn.    [provider]  diclofenac Sodium (VOLTAREN) 1 % GEL Apply 4 g topically 2 (two) times daily as needed. Apply to right knee and ankle    [provider]  esomeprazole  (NEXIUM) 40 MG capsule Take 1 capsule (40 mg total) by mouth 2 (two) times daily before a meal. 04/17/23   Doree Albee, PA-C  gabapentin (NEURONTIN) 100 MG capsule Take 2 capsules (200 mg total) by mouth at bedtime. 04/04/23   Everlena Cooper, Adam R, DO  MAGNESIUM PO Take 2 Doses by mouth at bedtime. 250 mg chewable total of 500 mg    [provider]  Menthol, Topical Analgesic, (BIOFREEZE) 4 % GEL Apply topically daily as needed.    [provider]  vitamin B-12 (CYANOCOBALAMIN) 1000 MCG tablet Take 1,000 mcg by mouth daily.    [provider]      Allergies    Patient has no known allergies.    Review of Systems   Review of Systems  Constitutional:  Negative for appetite change and fatigue.  HENT:  Negative for congestion, ear discharge and sinus pressure.   Eyes:  Negative for discharge.  Respiratory:  Negative for cough.   Cardiovascular:  Negative for chest pain.  Gastrointestinal:  Negative for abdominal pain, blood in stool and diarrhea.  Genitourinary:  Negative for frequency and hematuria.  Musculoskeletal:  Negative for back pain.  Skin:  Negative for rash.  Neurological:  Positive for dizziness and headaches. Negative for seizures.  Psychiatric/Behavioral:  Negative for hallucinations.     Physical Exam Updated Vital Signs BP 135/87   Pulse (!) 55   Temp 97.8 F (36.6 C) (Oral)   Resp 16  Ht 5\' 7"  (1.702 m)   Wt 81.6 kg   SpO2 96%   BMI 28.19 kg/m  Physical Exam Vitals and nursing note reviewed.  Constitutional:      Appearance: He is well-developed.  HENT:     Head: Normocephalic.     Nose: Nose normal.  Eyes:     General: No scleral icterus.    Conjunctiva/sclera: Conjunctivae normal.  Neck:     Thyroid: No thyromegaly.  Cardiovascular:     Rate and Rhythm: Normal rate and regular rhythm.     Heart sounds: No murmur heard.    No friction rub. No gallop.  Pulmonary:     Breath sounds: No stridor. No wheezing or rales.  Chest:      Chest wall: No tenderness.  Abdominal:     General: There is no distension.     Tenderness: There is no abdominal tenderness. There is no rebound.  Musculoskeletal:        General: Normal range of motion.     Cervical back: Neck supple.  Lymphadenopathy:     Cervical: No cervical adenopathy.  Skin:    Findings: No erythema or rash.  Neurological:     Mental Status: He is alert and oriented to person, place, and time.     Motor: No abnormal muscle tone.     Coordination: Coordination normal.  Psychiatric:        Behavior: Behavior normal.     ED Results / Procedures / Treatments   Labs (all labs ordered are listed, but only abnormal results are displayed) Labs Reviewed  BASIC METABOLIC PANEL - Abnormal; Notable for the following components:      Result Value   Glucose, Bld 101 (*)    Calcium 8.6 (*)    GFR, Estimated 59 (*)    All other components within normal limits  CBG MONITORING, ED - Abnormal; Notable for the following components:   Glucose-Capillary 100 (*)    All other components within normal limits  CBC    EKG None  Radiology CT Head Wo Contrast  Result Date: 08/15/2023 CLINICAL DATA:  Neuro deficit, acute, stroke suspected Dizziness. EXAM: CT HEAD WITHOUT CONTRAST TECHNIQUE: Contiguous axial images were obtained from the base of the skull through the vertex without intravenous contrast. RADIATION DOSE REDUCTION: This exam was performed according to the departmental dose-optimization program which includes automated exposure control, adjustment of the mA and/or kV according to patient size and/or use of iterative reconstruction technique. COMPARISON:  Head CT 02/13/2022, brain MRI 12/24/2022 FINDINGS: Brain: No intracranial hemorrhage, mass effect, or midline shift. No hydrocephalus. The basilar cisterns are patent. No evidence of territorial infarct or acute ischemia. No extra-axial or intracranial fluid collection. Vascular: Atherosclerosis of skullbase  vasculature without hyperdense vessel or abnormal calcification. Skull: No fracture or focal lesion. Sinuses/Orbits: Paranasal sinuses and mastoid air cells are clear. The visualized orbits are unremarkable. Other: None. IMPRESSION: No acute intracranial abnormality. Electronically Signed   By: Narda Rutherford M.D.   On: 08/15/2023 21:30    Procedures Procedures    Medications Ordered in ED Medications  ondansetron [facility]) injection 4 mg (4 mg Intravenous Given 08/15/23 1932)  sodium chloride 0.9 % bolus 500 mL (500 mLs Intravenous Bolus 08/15/23 1936)    ED Course/ Medical Decision Making/ A&P                                 Medical  Decision Making Amount and/or Complexity of Data Reviewed Labs: ordered. Radiology: ordered.  Risk Prescription drug management.  This patient presents to the ED for concern of dizziness, this involves an extensive number of treatment options, and is a complaint that carries with it a high risk of complications and morbidity.  The differential diagnosis includes vertigo, stroke   Co morbidities that complicate the patient evaluation  GERD   Additional history obtained:  Additional history obtained from family External records from outside source obtained and reviewed including all records   Lab Tests:  I Ordered, and personally interpreted labs.  The pertinent results include: CBC and chemistries unremarkable   Imaging Studies ordered:  I ordered imaging studies including CT head I independently visualized and interpreted imaging which showed negative I agree with the radiologist interpretation   Cardiac Monitoring: / EKG:  The patient was maintained on a cardiac monitor.  I personally viewed and interpreted the cardiac monitored which showed an underlying rhythm of: Normal sinus rhythm   Consultations Obtained:  No consultant Problem List / ED Course / Critical interventions / Medication management  Dizziness I ordered  medication including Antivert Reevaluation of the patient after these medicines showed that the patient improved I have reviewed the patients home medicines and have made adjustments as needed   Social Determinants of Health:  None   Test / Admission - Considered:  None  Patient with vertigo he is discharged home on Antivert and Zofran and will follow-up with ENT        Final Clinical Impression(s) / ED Diagnoses Final diagnoses:  Dizziness    Rx / DC Orders ED Discharge Orders          Ordered    meclizine (ANTIVERT) 25 MG tablet  3 times daily PRN        08/15/23 2221    ondansetron (ZOFRAN-ODT) 4 MG disintegrating tablet        08/15/23 2221              Bethann Berkshire, MD 08/17/23 1215

## 2023-08-20 ENCOUNTER — Ambulatory Visit (INDEPENDENT_AMBULATORY_CARE_PROVIDER_SITE_OTHER): Payer: Medicare HMO | Admitting: Adult Health

## 2023-08-20 ENCOUNTER — Encounter: Payer: Self-pay | Admitting: Adult Health

## 2023-08-20 VITALS — BP 118/78 | HR 60 | Temp 97.6°F | Resp 17 | Ht 67.0 in | Wt 181.0 lb

## 2023-08-20 DIAGNOSIS — K219 Gastro-esophageal reflux disease without esophagitis: Secondary | ICD-10-CM

## 2023-08-20 DIAGNOSIS — H9312 Tinnitus, left ear: Secondary | ICD-10-CM | POA: Diagnosis not present

## 2023-08-20 DIAGNOSIS — R42 Dizziness and giddiness: Secondary | ICD-10-CM

## 2023-08-20 NOTE — Progress Notes (Signed)
[facility] clinic  Provider:  Kenard Gower DNP  Code Status: Full Code  Goals of Care:     08/20/2023    9:18 AM  Advanced Directives  Does Patient Have a Medical Advance Directive? Yes  Type of Advance Directive Living will  Does patient want to make changes to medical advance directive? No - Patient declined  Would patient like information on creating a medical advance directive? No - Patient declined     Chief Complaint  Patient presents with   Hospitalization Follow-up    Ed follow up and discuss tdap and covid vaccines.    HPI: Patient is a 87 y.o. male seen today for hospitalization follow up. He was seen in the ED on 08/15/23 for dizziness and headache. CT head was negative for acute issues.CBC and chemistries were unremarkable. He was discharged with Meclizine PRN and Zofran PRN.  He was accompanied by his wife today. He stated that he is still dizzy but less. He is able to walk with a walker. Wife is the driver today. Patient stated that he is sensitive to loud noise and has ringing on his left ear.   He stated that he has stopped using Gabapentin.   Past Medical History:  Diagnosis Date   Anxiety disorder, unspecified    Arthritis    Cancer (HCC)    prostate   Cataract    Edema    Erectile dysfunction    Glaucoma    History of blood in urine    History of cataract surgery 09/26/2015   Both Eyes by Dr.Hunt.   History of colonoscopy 09/26/2007   Dr.LeBaur   Indigestion    Knee pain    Pain in unspecified hip    Prostate cancer [facility])    Urinary incontinence     Past Surgical History:  Procedure Laterality Date   CATARACT EXTRACTION W/PHACO Right 01/20/2016   Procedure: CATARACT EXTRACTION PHACO AND INTRAOCULAR LENS PLACEMENT (IOC);  Surgeon: Gemma Payor, MD;  Location: [facility];  Service: Ophthalmology;  Laterality: Right;  CDE 15.42   CATARACT EXTRACTION W/PHACO Left 03/06/2016   Procedure: CATARACT EXTRACTION PHACO AND INTRAOCULAR LENS PLACEMENT LEFT  EYE;  CDE:  15.81;  Surgeon: Gemma Payor, MD;  Location: [facility];  Service: Ophthalmology;  Laterality: Left;   CHOLECYSTECTOMY  2011   COLONOSCOPY  07/03/2008   FRACTURE SURGERY  09/25/1966   [facility].    GALLBLADDER SURGERY  09/26/2007   Dr.Jenkins    HERNIA REPAIR Right 09/26/1991   Dr.Smith    PROSTATE SURGERY     cancer   SUPRAPUBIC PROSTATECTOMY  09/26/1995   Dr.Wrenn   VASECTOMY  09/25/1972   [facility]     No Known Allergies  Outpatient Encounter Medications as of 08/20/2023  Medication Sig   acetaminophen (TYLENOL) 325 MG tablet Take 650 mg by mouth every 6 (six) hours as needed.   calcium carbonate (TUMS EX) 750 MG chewable tablet Chew 1 tablet by mouth as needed for heartburn.   diclofenac Sodium (VOLTAREN) 1 % GEL Apply 4 g topically 2 (two) times daily as needed. Apply to right knee and ankle   esomeprazole (NEXIUM) 40 MG capsule Take 1 capsule (40 mg total) by mouth 2 (two) times daily before a meal.   gabapentin (NEURONTIN) 100 MG capsule Take 2 capsules (200 mg total) by mouth at bedtime.   MAGNESIUM PO Take 2 Doses by mouth at bedtime. 250 mg chewable total of 500 mg   meclizine (  ANTIVERT) 25 MG tablet Take 1 tablet (25 mg total) by mouth 3 (three) times daily as needed for dizziness.   Menthol, Topical Analgesic, (BIOFREEZE) 4 % GEL Apply topically daily as needed.   ondansetron (ZOFRAN-ODT) 4 MG disintegrating tablet 4mg  ODT q4 hours prn nausea/vomit   vitamin B-12 (CYANOCOBALAMIN) 1000 MCG tablet Take 1,000 mcg by mouth daily.   No facility-administered encounter medications on file as of 08/20/2023.    Review of Systems:  Review of Systems  Constitutional:  Negative for activity change, appetite change and fever.  HENT:  Negative for sore throat.        Ringing on left ear.  Eyes: Negative.   Cardiovascular:  Negative for chest pain and leg swelling.  Gastrointestinal:  Negative for abdominal distention, diarrhea and vomiting.   Genitourinary:  Negative for dysuria, frequency and urgency.  Skin:  Negative for color change.  Neurological:  Positive for dizziness and headaches.  Psychiatric/Behavioral:  Negative for behavioral problems and sleep disturbance. The patient is not nervous/anxious.     Health Maintenance  Topic Date Due   DTaP/Tdap/Td (1 - Tdap) Never done   COVID-19 Vaccine (6 - 2023-24 season) 05/27/2023   Medicare Annual Wellness (AWV)  03/05/2024   Pneumonia Vaccine 57+ Years old  Completed   INFLUENZA VACCINE  Completed   Zoster Vaccines- Shingrix  Completed   HPV VACCINES  Aged Out    Physical Exam: Vitals:   08/20/23 0922  BP: 118/78  Pulse: 60  Resp: 17  Temp: 97.6 F (36.4 C)  SpO2: 97%  Weight: 181 lb (82.1 kg)  Height: 5\' 7"  (1.702 m)   Body mass index is 28.35 kg/m. Physical Exam Constitutional:      Appearance: Normal appearance.  HENT:     Head: Normocephalic and atraumatic.     Right Ear: Tympanic membrane, ear canal and external ear normal.     Left Ear: Tympanic membrane, ear canal and external ear normal.     Mouth/Throat:     Mouth: Mucous membranes are moist.  Eyes:     Conjunctiva/sclera: Conjunctivae normal.  Cardiovascular:     Rate and Rhythm: Normal rate and regular rhythm.     Pulses: Normal pulses.     Heart sounds: Normal heart sounds.  Pulmonary:     Effort: Pulmonary effort is normal.     Breath sounds: Normal breath sounds.  Abdominal:     General: Bowel sounds are normal.     Palpations: Abdomen is soft.  Musculoskeletal:        General: No swelling. Normal range of motion.     Cervical back: Normal range of motion.  Skin:    General: Skin is warm and dry.  Neurological:     General: No focal deficit present.     Mental Status: He is alert and oriented to person, place, and time.  Psychiatric:        Mood and Affect: Mood normal.        Behavior: Behavior normal.        Thought Content: Thought content normal.        Judgment:  Judgment normal.     Labs reviewed: Basic Metabolic Panel: Recent Labs    01/01/23 0928 07/02/23 0910 08/15/23 1931  NA 141 140 136  K 4.6 4.6 3.9  CL 106 103 106  CO2 30 27 23   GLUCOSE 92 98 101*  BUN 18 15 15   CREATININE 1.16 1.27* 1.20  CALCIUM 8.9 9.9 8.6*  TSH 1.18  --   --    Liver Function Tests: Recent Labs    01/01/23 0928 07/02/23 0910  AST 17 17  ALT 15 15  BILITOT 1.0 1.2  PROT 6.4 6.8   No results for input(s): "LIPASE", "AMYLASE" in the last 8760 hours. No results for input(s): "AMMONIA" in the last 8760 hours. CBC: Recent Labs    01/01/23 0928 07/02/23 0910 08/15/23 1931  WBC 5.9 6.0 8.5  NEUTROABS 3,481 3,210  --   HGB 15.2 15.8 15.5  HCT 44.5 48.0 45.4  MCV 88.1 90.7 88.2  PLT 177 179 177   Lipid Panel: No results for input(s): "CHOL", "HDL", "LDLCALC", "TRIG", "CHOLHDL", "LDLDIRECT" in the last 8760 hours. No results found for: "HGBA1C"  Procedures since last visit: CT Head Wo Contrast  Result Date: 08/15/2023 CLINICAL DATA:  Neuro deficit, acute, stroke suspected Dizziness. EXAM: CT HEAD WITHOUT CONTRAST TECHNIQUE: Contiguous axial images were obtained from the base of the skull through the vertex without intravenous contrast. RADIATION DOSE REDUCTION: This exam was performed according to the departmental dose-optimization program which includes automated exposure control, adjustment of the mA and/or kV according to patient size and/or use of iterative reconstruction technique. COMPARISON:  Head CT 02/13/2022, brain MRI 12/24/2022 FINDINGS: Brain: No intracranial hemorrhage, mass effect, or midline shift. No hydrocephalus. The basilar cisterns are patent. No evidence of territorial infarct or acute ischemia. No extra-axial or intracranial fluid collection. Vascular: Atherosclerosis of skullbase vasculature without hyperdense vessel or abnormal calcification. Skull: No fracture or focal lesion. Sinuses/Orbits: Paranasal sinuses and mastoid air  cells are clear. The visualized orbits are unremarkable. Other: None. IMPRESSION: No acute intracranial abnormality. Electronically Signed   By: Narda Rutherford M.D.   On: 08/15/2023 21:30    Assessment/Plan  1. Dizziness -  improved but still dizzy -  CT head was negative for acute issues -  continue Meclizine PRN  2. Ringing in left ear -  sensitive to loud noise - Ambulatory referral to ENT  3. Gastroesophageal reflux disease without esophagitis -  denies heartburns -  continue Nexium    Labs/tests ordered:  None  Next appt:  12/31/2023

## 2023-09-03 ENCOUNTER — Encounter (INDEPENDENT_AMBULATORY_CARE_PROVIDER_SITE_OTHER): Payer: Self-pay | Admitting: Otolaryngology

## 2023-10-04 ENCOUNTER — Telehealth (INDEPENDENT_AMBULATORY_CARE_PROVIDER_SITE_OTHER): Payer: Self-pay | Admitting: Otolaryngology

## 2023-10-04 ENCOUNTER — Ambulatory Visit (INDEPENDENT_AMBULATORY_CARE_PROVIDER_SITE_OTHER): Payer: Medicare HMO | Admitting: Nurse Practitioner

## 2023-10-04 ENCOUNTER — Encounter: Payer: Self-pay | Admitting: Nurse Practitioner

## 2023-10-04 VITALS — BP 142/74 | HR 84 | Ht 67.0 in | Wt 182.0 lb

## 2023-10-04 DIAGNOSIS — R1310 Dysphagia, unspecified: Secondary | ICD-10-CM | POA: Diagnosis not present

## 2023-10-04 DIAGNOSIS — K219 Gastro-esophageal reflux disease without esophagitis: Secondary | ICD-10-CM | POA: Diagnosis not present

## 2023-10-04 DIAGNOSIS — Z860101 Personal history of adenomatous and serrated colon polyps: Secondary | ICD-10-CM | POA: Diagnosis not present

## 2023-10-04 NOTE — Telephone Encounter (Signed)
 Reminder Call:  Date: 10/05/2023 Status: Sch  Time: 11:30 AM [address] [place], [place] [address] Left Voicemail

## 2023-10-04 NOTE — Progress Notes (Signed)
 10/04/2023 Kevin [name] [id number] [birth year]   Chief Complaint: Dysphagia   History of Present Illness: Kevin [name] is an 88 year old male with a past medical history of anxiety, mild memory impairment,  prostate cancer, GERD, dysphagia and colon polyps. His is known by Kevin [name].  He was last seen in office by Kevin Coombs, PA-C 04/13/2022 due to having dysphagia and constipation.  Endoscopic evaluation versus virtual colonoscopy and barium swallow were discussed but were not pursued as the patient and his wife planned on traveling and were instructed to contact our office if he wished to proceed with any further GI evaluation.  He presents today for further evaluation regarding dysphagia. He is accompanied by his wife. He stated his wife accompanies him to his medical appointments because he has memory issues. He describes having difficulty swallowing solids, liquids and pills for at least the past 5 years which has progressively worsened. He describes holding liquids in his mouth before he swallows. Pills get hung up in the right upper esophagus, he shakes and rotates his neck to facilitate the passage of the pill down the esophagus. Food sometime gets briefly hung up in the throat or upper esophagus which occurs a few times weekly. He drinks water or wait a little while and the food goes down. He has heartburn/indigestion most days for which he takes Tums 1 to 2 tabs two to three times daily. He takes Esomeprazole  40mg  once daily, usually at night time.  No NSAID use.  No N/V. He has intermittent constipation, passes 1 to 3 firm stools daily. No bloody or black stools. He takes Miralax most nights.  He underwent a modified barium swallow study with speech pathologist 03/09/2020 which showed delayed oral transit without evidence of aspiration or pharyngeal dysphagia.  He underwent a colonoscopy 04/20/2008 which identified 3 tubular adenomatous polyps removed from the colon.  He has  dizziness and underwent a recent head CT and brain MRI did not show any evidence of acute intracranial abnormality to explain his dizziness.  He is scheduled to see an ENT tomorrow for possible vertigo.  No chest pain or palpitations.  He sometimes feels slightly short of breath.    He has intermittent right lower back pain.      Latest Ref Rng & Units 08/15/2023    7:31 PM 07/02/2023    9:10 AM 01/01/2023    9:28 AM  CBC  WBC 4.0 - 10.5 K/uL 8.5  6.0  5.9   Hemoglobin 13.0 - 17.0 g/dL 84.4  84.1  84.7   Hematocrit 39.0 - 52.0 % 45.4  48.0  44.5   Platelets 150 - 400 K/uL 177  179  177        Latest Ref Rng & Units 08/15/2023    7:31 PM 07/02/2023    9:10 AM 01/01/2023    9:28 AM  CMP  Glucose 70 - 99 mg/dL 898  98  92   BUN 8 - 23 mg/dL 15  15  18    Creatinine 0.61 - 1.24 mg/dL 8.79  8.72  8.83   Sodium 135 - 145 mmol/L 136  140  141   Potassium 3.5 - 5.1 mmol/L 3.9  4.6  4.6   Chloride 98 - 111 mmol/L 106  103  106   CO2 22 - 32 mmol/L 23  27  30    Calcium 8.9 - 10.3 mg/dL 8.6  9.9  8.9   Total Protein 6.1 - 8.1 g/dL  6.8  6.4   Total Bilirubin 0.2 - 1.2 mg/dL  1.2  1.0   AST 10 - 35 U/L  17  17   ALT 9 - 46 U/L  15  15    TSH 1.18 on 01/01/2023  Head Ct 08/15/2023: No acute intracranial abnormality.  Brain MRI 12/24/2022: No acute or reversible finding. Age related volume loss. Minimal small vessel change of the cerebral hemispheric white matter, less than usually seen at this age.  Modified barium swallow study with speech pathologist 03/09/2020: Pt has a functional oropharyngeal swallow without aspiration observed. He does have delayed oral transit and at times piecemeal swallowing, noted primarily thin liquids via cup and thin liquids with barium tablet. This appears to be compensatory in nature as opposed to inability to transit boluses. His pharyngeal phase is [facility]. Pt did report the sensation of the barium tablet being stuck in his throat when it was observed to be further  down in his esophagus, even after it had cleared his esophagus. After testing was completed, pt was also observed performing additional dry swallows, saying that he needed to let everything get to his stomach. Question if his symptoms are more esophageal in nature, but there is no observed pharyngeal dysphagia during this study.   PAST GI PROCEDURES:  Colonoscopy 04/20/2008: 1) THREE (3) POLYPS REMOVED , MAX SIZE 8 MM 2) SIGMOID DIVERTICULOSIS, MILD-MODERATE 3) OTHERWISE NORMAL COLONOSCOPY, ADEQUATE PREP Events 1. COLON, ASCENDING AND TRANSVERSE, POLYPS:   - FRAGMENTS OF TUBULAR ADENOMAS.   - HIGH GRADE DYSPLASIA IS NOT IDENTIFIED.    2. COLON, DESCENDING, POLYP:   - TUBULAR ADENOMA.   - HIGH GRADE DYSPLASIA IS NOT IDENTIFIED.   Current Outpatient Medications on File Prior to Visit  Medication Sig Dispense Refill   acetaminophen (TYLENOL) 325 MG tablet Take 650 mg by mouth every 6 (six) hours as needed.     calcium carbonate (TUMS EX) 750 MG chewable tablet Chew 1 tablet by mouth as needed for heartburn.     diclofenac Sodium (VOLTAREN) 1 % GEL Apply 4 g topically 2 (two) times daily as needed. Apply to right knee and ankle     esomeprazole  (NEXIUM ) 40 MG capsule Take 1 capsule (40 mg total) by mouth 2 (two) times daily before a meal. 60 capsule 0   MAGNESIUM  PO Take 2 Doses by mouth at bedtime. 250 mg chewable total of 500 mg     meclizine  (ANTIVERT ) 25 MG tablet Take 1 tablet (25 mg total) by mouth 3 (three) times daily as needed for dizziness. 20 tablet 0   Menthol, Topical Analgesic, (BIOFREEZE) 4 % GEL Apply topically daily as needed.     ondansetron  (ZOFRAN -ODT) 4 MG disintegrating tablet 4mg  ODT q4 hours prn nausea/vomit 10 tablet 0   vitamin B-12 (CYANOCOBALAMIN ) 1000 MCG tablet Take 1,000 mcg by mouth daily.     No current facility-administered medications on file prior to visit.   Allergies  Allergen Reactions   Beef-Derived Drug Products Nausea And Vomiting   Current  Medications, Allergies, Past Medical History, Past Surgical History, Family History and Social History were reviewed in [employer] record.  Review of Systems:   Constitutional: Negative for fever, sweats, chills or weight loss.  Respiratory: Negative for shortness of breath.   Cardiovascular: Negative for chest pain, palpitations and leg swelling.  Gastrointestinal: See HPI.  Musculoskeletal: + Right back pain.  Neurological: + Dizziness.   Physical Exam: BP (!) 142/74   Pulse 84  Ht 5' 7 (1.702 m)   Wt 182 lb (82.6 kg)   BMI 28.51 kg/m  Wt Readings from Last 3 Encounters:  10/04/23 182 lb (82.6 kg)  08/20/23 181 lb (82.1 kg)  08/15/23 180 lb (81.6 kg)    General: 88 year old male in no acute distress, ambulates with a cane.  Head: Normocephalic and atraumatic. Eyes: No scleral icterus. Conjunctiva pink . Ears: Normal auditory acuity. Mouth: Dentition intact. No ulcers or lesions.  Lungs: Clear throughout to auscultation. Heart: Regular rate and rhythm, no murmur. Abdomen: Soft, nontender and nondistended. No masses or hepatomegaly. Normal bowel sounds x 4 quadrants.  Rectal: Deferred.  Musculoskeletal: Symmetrical with no gross deformities. Extremities: No edema. Neurological: Alert oriented x 4. No focal deficits.  Psychological: Alert and cooperative. Normal mood and affect  Assessment and Recommendations:  88 year old male with GERD and chronic dysphagia with pills, solid foods and liquids -Barium swallow study with tablet  -Take Esomeprazole  40mg  one tab 30 minutes before breakfast and dinner  -Avoid eating large pieces of meat/bread or rice  -EGD deferred for now, further recommendations to be determined after barium swallow study results reviewed  History of colon polyps.  Colonoscopy 04/20/2008 identified 3 tubular adenomatous polyps removed from the colon. -No further colon polyp surveillance colonoscopies recommended at this juncture  secondary to age  Right lower back pain, intermittent  -Follow up with PCP  Dizziness -Continue follow up with PCP

## 2023-10-04 NOTE — Progress Notes (Signed)
  [address] [place], [place] [address] [phone]  Audiological Evaluation    Name: Kevin [name]     DOB:   Nov 28, 1935      MRN:   [id number]                                                                                     Service Date: 10/04/2023     Accompanied by: wife   Patient comes today after Dr. Karis, ENT sent a referral for a hearing evaluation due to concerns with left ear tinnitus and hearing loss.   Symptoms Yes Details  Hearing loss  [x]  Worse in the left ear for or ethan 20 years  Tinnitus  [x]  Worse in the left ear for or ethan 20 years  Ear pain/ Ear infections  []    Balance problems  [x]  Lightheaded sensation - on and off , unknown trigger. May last 20 minutes to hours  Noise exposure  []    Previous ear surgeries  []    Family history  []    Amplification  []    Other  []      Otoscopy: Right ear: Clear external ear canals and notable landmarks visualized on the tympanic membrane. Left ear:  Clear external ear canals and notable landmarks visualized on the tympanic membrane.  Tympanometry: Right ear: Type A- Normal external ear canal volume with normal middle ear pressure and tympanic membrane compliance Left ear: Type A- Normal external ear canal volume with normal middle ear pressure and tympanic membrane compliance  Pure tone Audiometry: Right ear- Borderline normal to moderately severe sensorineural hearing loss from 250 Hz - 8000 Hz. Left ear-  Borderline normal to severe sensorineural hearing loss from 250 Hz - 8000 Hz.  The hearing test results were completed under headphones and results are deemed to be of good reliability. Test technique:  conventional     Speech Audiometry: Right ear- Speech Reception Threshold (SRT) was obtained at 25 dBHL Left ear-Speech Reception Threshold (SRT) was obtained at 40 dBHL   Word Recognition Score Tested using NU-6 (MLV) Right ear: 88% was obtained at a presentation level of 75 dBHL with  contralateral masking which is deemed as  good  Left ear: 68% was obtained at a presentation level of 85 dBHL with contralateral masking which is deemed as  poor    Impression: There is a significant difference in pure-tone thresholds between ears., There is a significant difference in word recognition scores , worse in the left ear.     Recommendations: Follow up with ENT as scheduled for today. Return for a hearing evaluation if concerns with hearing changes arise or per MD recommendation. Consider various tinnitus strategies, including the use of a sound generator, hearing aids, and/or tinnitus retraining therapy.  Consider a communication needs assessment after medical clearance for hearing aids is obtained.   Annali Lybrand MARIE LEROUX-MARTINEZ, AUD

## 2023-10-04 NOTE — Patient Instructions (Signed)
 Take Nexium  40 mg- take 1 by mouth twice daily, 30 minutes before breakfast & dinner.  You have been scheduled for a Barium Esophogram at [facility] (1st floor of the hospital) on 10/10/23 at 9:00 am. Please arrive 15-20 minutes prior to your appointment for registration. If you need to reschedule for any reason, please contact radiology at [phone] to do so. ____________________________________________  A barium swallow is an examination that concentrates on views of the esophagus. This tends to be a double contrast exam (barium and two liquids which, when combined, create a gas to distend the wall of the oesophagus) or single contrast (non-ionic iodine  based). The study is usually tailored to your symptoms so a good history is essential. Attention is paid during the study to the form, structure and configuration of the esophagus, looking for functional disorders (such as aspiration, dysphagia, achalasia, motility and reflux) EXAMINATION You may be asked to change into a gown, depending on the type of swallow being performed. A radiologist and radiographer will perform the procedure. The radiologist will advise you of the type of contrast selected for your procedure and direct you during the exam. You will be asked to stand, sit or lie in several different positions and to hold a small amount of fluid in your mouth before being asked to swallow while the imaging is performed .In some instances you may be asked to swallow barium coated marshmallows to assess the motility of a solid food bolus. The exam can be recorded as a digital or video fluoroscopy procedure. POST PROCEDURE It will take 1-2 days for the barium to pass through your system. To facilitate this, it is important, unless otherwise directed, to increase your fluids for the next 24-48hrs and to resume your normal diet.  This test typically takes about 30 minutes to  perform. __________________________________________________________________________________  Thank you for trusting me with your gastrointestinal care!   Elida Shawl, CRNP

## 2023-10-05 ENCOUNTER — Ambulatory Visit (INDEPENDENT_AMBULATORY_CARE_PROVIDER_SITE_OTHER): Payer: Medicare HMO | Admitting: Audiology

## 2023-10-05 ENCOUNTER — Encounter (INDEPENDENT_AMBULATORY_CARE_PROVIDER_SITE_OTHER): Payer: Self-pay

## 2023-10-05 ENCOUNTER — Ambulatory Visit (INDEPENDENT_AMBULATORY_CARE_PROVIDER_SITE_OTHER): Payer: Medicare HMO | Admitting: Otolaryngology

## 2023-10-05 VITALS — BP 151/67 | HR 50 | Ht 67.0 in | Wt 180.0 lb

## 2023-10-05 DIAGNOSIS — H903 Sensorineural hearing loss, bilateral: Secondary | ICD-10-CM

## 2023-10-05 DIAGNOSIS — H9319 Tinnitus, unspecified ear: Secondary | ICD-10-CM | POA: Diagnosis not present

## 2023-10-05 DIAGNOSIS — H9312 Tinnitus, left ear: Secondary | ICD-10-CM

## 2023-10-07 NOTE — Progress Notes (Signed)
 NEUROLOGY FOLLOW UP OFFICE NOTE  Kevin [name] [id number]  Assessment/Plan:   Episodic tension type headache, not intractable, stable Mild neurocognitive disorder, stable Episodic visual disturbance - consider ocular migraines     Would monitor memory for now. Follow up 1 year     Subjective:  Kevin [name] is an 88 year old male with arthritis, anxiety, cataract, memory deficits and history of prostate cancer who follows up for headaches and MCI.  Accompanied by his wife who supplements history.  UPDATE: Last visit, increased gabapentin  to 200mg  at bedtime for headaches.  However he stopped taking it because it may have made him agitated. He is doing fine with the Tylenol.  However, his headaches have improved.  They come and go.  They are only mild.  Responds to Tylenol.  They have not been severe for 6 months.    He still has episodes of visual disturbance once in awhile, usually while watching TV.  They are less frequent as well.    Had been experiencing dizziness with hearing loss and left ear tinnitus.  Seen in ED on 08/15/2023.  CT head personally reviewed revealed age related volume loss and minimal chronic small vessel ischemic changes but no acute findings.  Evaluated by ENT.  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   He is looking into getting a hearing aid.    No change in short-term memory problems.  No problems with long-term memory problems.     HISTORY: He reports short-term memory problems since 2023 and progressively getting worse.  He will often walk into a room and forget what he wanted to get.  He may forget recent conversations with people.  Sometimes he may forget the name of people he knows.  He is independent.  He doesn't drive much but only locally and without disorientation.  He pays the bills without difficulty.  He performs all activities of daily living independently.  Denies formed  hallucinations.  Denies nightmares but sometimes wakes up irritated by a dream. His father had some memory problems but was never diagnosed with dementia. TSH 1.22, non-reactive RPR, B12 273. He was advised to start OTC B12 1000mcg daily which he took for awhile but then stopped.  He was prescribed donepezil  but stopped it himself because he thought it interfered with his sleeping.  Repeat B12 from 11/24/2022 was 589.  TSH from 01/01/2023 was 1.18.  MRI of brain without contrast on 12/24/2022  revealed age-related volume loss and minimal chronic small vessel ischemic changes in the cerebral hemispheres but overall unremarkable.     For several years, he has had headaches.  They are usually left frontal (sometimes spreads to bilateral) 5/10 pressure pain.  Associated photophobia but no nausea, vomiting, phonophobia, or visual disturbance.  Lasts about an hour with Tylenol.  Occurs 2 to 3 days a week.     For a few years, he also has had recurrent episodes of visual disturbance.  It usually occurs while watching TV.  He will suddenly see a dark pattern (looks like a hand) rotating in the left half of his visual field in both eyes.  Typically lasts up to 30 minutes.  No associated headache.  Occurs once a week.  He has history of cataract surgery.  Has not had an eye exam in several years.    PAST MEDICAL HISTORY: Past Medical History:  Diagnosis Date   Anxiety disorder, unspecified    Arthritis  Cancer [facility])    prostate   Cataract    Edema    Erectile dysfunction    Glaucoma    History of blood in urine    History of cataract surgery 09/26/2015   Both Eyes by Dr.Hunt.   History of colonoscopy 09/26/2007   Dr.LeBaur   Indigestion    Knee pain    Pain in unspecified hip    Prostate cancer [facility])    Urinary incontinence     MEDICATIONS: Current Outpatient Medications on File Prior to Visit  Medication Sig Dispense Refill   acetaminophen (TYLENOL) 325 MG tablet Take 650 mg by mouth every 6  (six) hours as needed.     calcium carbonate (TUMS EX) 750 MG chewable tablet Chew 1 tablet by mouth as needed for heartburn.     diclofenac Sodium (VOLTAREN) 1 % GEL Apply 4 g topically 2 (two) times daily as needed. Apply to right knee and ankle     esomeprazole  (NEXIUM ) 40 MG capsule Take 1 capsule (40 mg total) by mouth 2 (two) times daily before a meal. 60 capsule 0   MAGNESIUM  PO Take 2 Doses by mouth at bedtime. 250 mg chewable total of 500 mg     meclizine  (ANTIVERT ) 25 MG tablet Take 1 tablet (25 mg total) by mouth 3 (three) times daily as needed for dizziness. 20 tablet 0   Menthol, Topical Analgesic, (BIOFREEZE) 4 % GEL Apply topically daily as needed.     ondansetron  (ZOFRAN -ODT) 4 MG disintegrating tablet 4mg  ODT q4 hours prn nausea/vomit 10 tablet 0   vitamin B-12 (CYANOCOBALAMIN ) 1000 MCG tablet Take 1,000 mcg by mouth daily.     No current facility-administered medications on file prior to visit.    ALLERGIES: Allergies  Allergen Reactions   Beef-Derived Drug Products Nausea And Vomiting    FAMILY HISTORY: Family History  Problem Relation Age of Onset   Healthy Mother    Hypertension Mother    Dementia Mother    Heart disease Father    Dementia Father    Heart failure Father    Heart disease Brother    Stroke Brother    Hypertension Son    Stroke Brother       Objective:  Blood pressure (!) 128/59, pulse (!) 50, height 5' 8 (1.727 m), weight 182 lb (82.6 kg), SpO2 100%. General: No acute distress.  Patient appears well-groomed.   Head:  Normocephalic/atraumatic Neck:  Supple.  No paraspinal tenderness.  Full range of motion. Heart:  Regular rate and rhythm. Neuro:      10/08/2023    9:00 AM 11/24/2022   11:00 AM  Montreal Cognitive Assessment   Visuospatial/ Executive (0/5) 4 4  Naming (0/3) 2 1  Attention: Read list of digits (0/2) 2 2  Attention: Read list of letters (0/1) 1 0  Attention: Serial 7 subtraction starting at 100 (0/3) 2 3  Language:  Repeat phrase (0/2) 2 2  Language : Fluency (0/1) 0 0  Abstraction (0/2) 1 0  Delayed Recall (0/5) 0 0  Orientation (0/6) 4 4  Total 18 16  Adjusted Score (based on education) 19 17  CN II-XII intact. Bulk and tone normal, muscle strength 5/5 throughout.  Sensation to light touch intact.  Deep tendon reflexes 2+ throughout.  Finger to nose testing intact.  Gait normal, Romberg negative.      Juliene Dunnings, DO  CC: [name], NP

## 2023-10-08 ENCOUNTER — Ambulatory Visit: Payer: Medicare HMO | Admitting: Neurology

## 2023-10-08 ENCOUNTER — Encounter: Payer: Self-pay | Admitting: Neurology

## 2023-10-08 ENCOUNTER — Ambulatory Visit: Payer: Medicare HMO

## 2023-10-08 VITALS — BP 128/59 | HR 50 | Ht 68.0 in | Wt 182.0 lb

## 2023-10-08 DIAGNOSIS — H539 Unspecified visual disturbance: Secondary | ICD-10-CM | POA: Diagnosis not present

## 2023-10-08 DIAGNOSIS — G44219 Episodic tension-type headache, not intractable: Secondary | ICD-10-CM | POA: Diagnosis not present

## 2023-10-08 DIAGNOSIS — H903 Sensorineural hearing loss, bilateral: Secondary | ICD-10-CM | POA: Insufficient documentation

## 2023-10-08 DIAGNOSIS — G3184 Mild cognitive impairment, so stated: Secondary | ICD-10-CM

## 2023-10-08 DIAGNOSIS — H9312 Tinnitus, left ear: Secondary | ICD-10-CM | POA: Insufficient documentation

## 2023-10-08 NOTE — Progress Notes (Signed)
 Patient ID: Kevin [name], male   DOB: 11/27/1935, 88 y.o.   MRN: [id number]  CC: Left ear tinnitus  HPI:  Kevin [name] is a 88 y.o. male who presents today complaining of left ear tinnitus for the past 2 years.  The tinnitus is constant and has progressively worsened.  He describes the tinnitus as a constant ringing noise.  It is nonpulsatile.  His MRI scan in March 2024 and head CT scan in November 2024 were all negative for retrocochlear lesion.  The patient has no previous history of otologic surgery.  He denies any recent otitis media or otitis externa.  He has never worn hearing aids.  Past Medical History:  Diagnosis Date   Anxiety disorder, unspecified    Arthritis    Cancer (HCC)    prostate   Cataract    Edema    Erectile dysfunction    Glaucoma    History of blood in urine    History of cataract surgery 09/26/2015   Both Eyes by Dr.Hunt.   History of colonoscopy 09/26/2007   Dr.LeBaur   Indigestion    Knee pain    Pain in unspecified hip    Prostate cancer [facility])    Urinary incontinence     Past Surgical History:  Procedure Laterality Date   CATARACT EXTRACTION W/PHACO Right 01/20/2016   Procedure: CATARACT EXTRACTION PHACO AND INTRAOCULAR LENS PLACEMENT (IOC);  Surgeon: Cherene Mania, MD;  Location: [facility];  Service: Ophthalmology;  Laterality: Right;  CDE 15.42   CATARACT EXTRACTION W/PHACO Left 03/06/2016   Procedure: CATARACT EXTRACTION PHACO AND INTRAOCULAR LENS PLACEMENT LEFT EYE;  CDE:  15.81;  Surgeon: Cherene Mania, MD;  Location: [facility];  Service: Ophthalmology;  Laterality: Left;   CHOLECYSTECTOMY  2011   COLONOSCOPY  07/03/2008   FRACTURE SURGERY  09/25/1966   [facility].    GALLBLADDER SURGERY  09/26/2007   Dr.Jenkins    HERNIA REPAIR Right 09/26/1991   Dr.Smith    PROSTATE SURGERY     cancer   SUPRAPUBIC PROSTATECTOMY  09/26/1995   Dr.Wrenn   VASECTOMY  09/25/1972   [facility].     Family History  Problem Relation Age of  Onset   Healthy Mother    Hypertension Mother    Dementia Mother    Heart disease Father    Dementia Father    Heart failure Father    Heart disease Brother    Stroke Brother    Hypertension Son    Stroke Brother     Social History:  reports that he quit smoking about 19 years ago. His smoking use included cigarettes and cigars. He started smoking about 35 years ago. He has a 8 pack-year smoking history. He has never used smokeless tobacco. He reports current alcohol use of about 1.0 standard drink of alcohol per week. He reports that he does not use drugs.  Allergies:  Allergies  Allergen Reactions   Beef-Derived Drug Products Nausea And Vomiting    Prior to Admission medications   Medication Sig Start Date End Date Taking? Authorizing Provider  acetaminophen (TYLENOL) 325 MG tablet Take 650 mg by mouth every 6 (six) hours as needed.   Yes [provider]  calcium carbonate (TUMS EX) 750 MG chewable tablet Chew 1 tablet by mouth as needed for heartburn.   Yes [provider]  diclofenac Sodium (VOLTAREN) 1 % GEL Apply 4 g topically 2 (two) times daily as needed. Apply to right knee and  ankle   Yes [provider]  esomeprazole  (NEXIUM ) 40 MG capsule Take 1 capsule (40 mg total) by mouth 2 (two) times daily before a meal. 04/17/23  Yes Craig Alan SAUNDERS, PA-C  MAGNESIUM  PO Take 2 Doses by mouth at bedtime. 250 mg chewable total of 500 mg   Yes [provider]  meclizine  (ANTIVERT ) 25 MG tablet Take 1 tablet (25 mg total) by mouth 3 (three) times daily as needed for dizziness. 08/15/23  Yes Zammit, Joseph, MD  Menthol, Topical Analgesic, (BIOFREEZE) 4 % GEL Apply topically daily as needed.   Yes [provider]  ondansetron  (ZOFRAN -ODT) 4 MG disintegrating tablet 4mg  ODT q4 hours prn nausea/vomit 08/15/23  Yes Zammit, Joseph, MD  vitamin B-12 (CYANOCOBALAMIN ) 1000 MCG tablet Take 1,000 mcg by mouth daily.   Yes [provider]     Blood pressure (!) 151/67, pulse (!) 50, height 5' 7 (1.702 m), weight 180 lb (81.6 kg), SpO2 99%. Exam: General: Communicates without difficulty, well nourished, no acute distress. Head: Normocephalic, no evidence injury, no tenderness, facial buttresses intact without stepoff. Face/sinus: No tenderness to palpation and percussion. Facial movement is normal and symmetric. Eyes: PERRL, EOMI. No scleral icterus, conjunctivae clear. Neuro: CN II exam reveals vision grossly intact.  No nystagmus at any point of gaze. Ears: Auricles well formed without lesions.  Ear canals are intact without mass or lesion.  No erythema or edema is appreciated.  The TMs are intact without fluid. Nose: External evaluation reveals normal support and skin without lesions.  Dorsum is intact.  Anterior rhinoscopy reveals congested mucosa over anterior aspect of inferior turbinates and intact septum.  No purulence noted. Oral:  Oral cavity and oropharynx are intact, symmetric, without erythema or edema.  Mucosa is moist without lesions. Neck: Full range of motion without pain.  There is no significant lymphadenopathy.  No masses palpable.  Thyroid  bed within normal limits to palpation.  Parotid glands and submandibular glands equal bilaterally without mass.  Trachea is midline. Neuro:  CN 2-12 grossly intact.   His hearing test shows bilateral high-frequency sensorineural hearing loss, worse on the left side.  Assessment: 1.  Bilateral high-frequency sensorineural hearing loss, worse on the left side. 2.  His left ear tinnitus is likely a direct result of his hearing loss. 3.  His ear canals, tympanic membranes, and middle ear spaces are normal. 4.  His previous MRI and head CT scan were all negative for retrocochlear lesion.  Plan: 1.  The physical exam findings and the hearing test results are reviewed with the patient. 2.  The strategies to cope with tinnitus, including the use of masker, hearing aids, tinnitus  retraining therapy, and avoidance of caffeine and alcohol are discussed. 3.  The patient is a candidate for hearing amplification.  The hearing aid options are discussed. 4.  The patient will return for reevaluation in 1 year.  Shevelle Smither W Bren Steers 10/08/2023, 8:50 AM

## 2023-10-10 ENCOUNTER — Ambulatory Visit (HOSPITAL_COMMUNITY)
Admission: RE | Admit: 2023-10-10 | Discharge: 2023-10-10 | Disposition: A | Discharge status: Home / Self Care | Payer: Medicare HMO | Source: Ambulatory Visit | Attending: Nurse Practitioner | Admitting: Nurse Practitioner

## 2023-10-10 DIAGNOSIS — R1310 Dysphagia, unspecified: Secondary | ICD-10-CM | POA: Insufficient documentation

## 2023-10-10 DIAGNOSIS — R638 Other symptoms and signs concerning food and fluid intake: Secondary | ICD-10-CM | POA: Diagnosis not present

## 2023-10-19 ENCOUNTER — Other Ambulatory Visit (HOSPITAL_COMMUNITY): Payer: Self-pay | Admitting: *Deleted

## 2023-10-19 ENCOUNTER — Other Ambulatory Visit: Payer: Self-pay

## 2023-10-19 ENCOUNTER — Telehealth (HOSPITAL_COMMUNITY): Payer: Self-pay | Admitting: *Deleted

## 2023-10-19 DIAGNOSIS — R1310 Dysphagia, unspecified: Secondary | ICD-10-CM

## 2023-10-19 NOTE — Telephone Encounter (Signed)
Attempted to contact patient to schedule OP MBS. Left VM. RKEEL

## 2023-11-09 ENCOUNTER — Other Ambulatory Visit: Payer: Self-pay | Admitting: Physician Assistant

## 2023-11-09 DIAGNOSIS — R1310 Dysphagia, unspecified: Secondary | ICD-10-CM

## 2023-11-09 DIAGNOSIS — K219 Gastro-esophageal reflux disease without esophagitis: Secondary | ICD-10-CM

## 2023-11-12 ENCOUNTER — Ambulatory Visit (HOSPITAL_COMMUNITY)
Admission: RE | Admit: 2023-11-12 | Discharge: 2023-11-12 | Disposition: A | Discharge status: Home / Self Care | Payer: Medicare HMO | Source: Ambulatory Visit | Attending: Nurse Practitioner | Admitting: Nurse Practitioner

## 2023-11-12 ENCOUNTER — Ambulatory Visit (HOSPITAL_COMMUNITY)
Admission: RE | Admit: 2023-11-12 | Discharge: 2023-11-12 | Disposition: A | Discharge status: Home / Self Care | Payer: Medicare HMO | Source: Ambulatory Visit | Attending: Nurse Practitioner

## 2023-11-12 DIAGNOSIS — R1310 Dysphagia, unspecified: Secondary | ICD-10-CM | POA: Diagnosis not present

## 2023-11-12 DIAGNOSIS — R1311 Dysphagia, oral phase: Secondary | ICD-10-CM | POA: Insufficient documentation

## 2023-11-12 NOTE — Evaluation (Signed)
 Modified Barium Swallow Study  Patient Details  Name: Kevin [name] MRN: [id number] Date of Birth: [birth year]  Today's Date: 11/12/2023  Modified Barium Swallow completed.  Full report located under Chart Review in the Imaging Section.  History of Present Illness 88 yo pt presenting for OP MBS due to difficulty swallowing. Pt reports globus sensation, difficulty masticating due to his jaw "locking up," and trouble getting boluses from his oral cavity to his pharynx. He mostly  notices this with pills and tough, dense meats. Esophagram 1/15 showed significant difficulty transiting boluses orally and intermittent penetration, as well as transient retention of the tablet in the L pyriform sinus. Esophageal motility was [facility]. Pt had a previous MBS in 2021 with pharyngeal function WNL. He did have some delayed oral transit and piecemeal swallowing intermittently, which was felt to be compensatory in nature. PMH also includes: anxiety, indigestion, SNHL   Clinical Impression Pt has a primarily oral dysphagia, with prolonged lingual manipulation of boluses within his oral cavity prior to initiating posterior transit. This is observed with all consistencies, although less with nectar thick liquids compared to thin liquids, and often requires a posterior head tilt to begin lingual propulsion. No significant difference between use of spoon, cup, or straw. He has pretty good lingual control though, with minimal posterior spillage, brisk lingual movement once triggered, and good oral clearance. Pharyngeal function also seems to be fairly intact except for mildly reduced duration of UES opening that results in trace-mild pharyngeal residue. Aspiration occurred x1 while trying to take the barium tablet with thin liquids, but this was transient in nature (PAS 6). Pt also had c/o globus sensation during the study when pharynx was noted to be clear. Asked if this could be impacting his readiness to orally transit  boluses, but he wasn't sure. Note that this was observed to a lesser degree during MBS in 2021 and was felt to be more compensatory/behavioral in nature. Although etiology still remains unclear, this has progressed to a point that as the potential to impact adequate nutrition and hydration and would therefore recommend OP SLP.   DIGEST Swallow Severity Rating*  Safety: 1  Efficiency: 1  Overall Pharyngeal Swallow Severity: 1 1: mild; 2: moderate; 3: severe; 4: profound  *The Dynamic Imaging Grade of Swallowing Toxicity is standardized for the head and neck cancer population, however, demonstrates promising clinical applications across populations to standardize the clinical rating of pharyngeal swallow safety and severity.   Factors that may increase risk of adverse event in presence of aspiration [employer] Clearance Coots 2021): Respiratory or GI disease;Reduced cognitive function  Swallow Evaluation Recommendations Recommendations: PO diet PO Diet Recommendation: Regular;Thin liquids (Level 0) Liquid Administration via: Cup;Straw Medication Administration: Whole meds with liquid (can try in puree per pt preference) Supervision: Patient able to self-feed Swallowing strategies  : Slow rate;Small bites/sips Postural changes: Position pt fully upright for meals;Stay upright 30-60 min after meals Oral care recommendations: Oral care BID (2x/day)      Mahala Menghini., M.A. CCC-SLP Acute Rehabilitation Services Office [phone]  Secure chat preferred  11/12/2023,2:22 PM

## 2023-11-28 ENCOUNTER — Other Ambulatory Visit: Payer: Self-pay

## 2023-11-28 DIAGNOSIS — R1310 Dysphagia, unspecified: Secondary | ICD-10-CM

## 2023-12-19 ENCOUNTER — Ambulatory Visit

## 2023-12-19 NOTE — Therapy (Deleted)
 OUTPATIENT SPEECH LANGUAGE PATHOLOGY SWALLOW EVALUATION   Patient Name: Kevin [name] MRN: [id number] DOB:04-22-1936, 88 y.o., male Today's Date: 12/19/2023  PCP: Sharon Seller NP  REFERRING PROVIDER: Iva Boop, MD  END OF SESSION:   Past Medical History:  Diagnosis Date   Anxiety disorder, unspecified    Arthritis    Cancer (HCC)    prostate   Cataract    Edema    Erectile dysfunction    Glaucoma    History of blood in urine    History of cataract surgery 09/26/2015   Both Eyes by Dr.Hunt.   History of colonoscopy 09/26/2007   Dr.LeBaur   Indigestion    Knee pain    Pain in unspecified hip    Prostate cancer [facility])    Urinary incontinence    Past Surgical History:  Procedure Laterality Date   CATARACT EXTRACTION W/PHACO Right 01/20/2016   Procedure: CATARACT EXTRACTION PHACO AND INTRAOCULAR LENS PLACEMENT (IOC);  Surgeon: Gemma Payor, MD;  Location: [facility];  Service: Ophthalmology;  Laterality: Right;  CDE 15.42   CATARACT EXTRACTION W/PHACO Left 03/06/2016   Procedure: CATARACT EXTRACTION PHACO AND INTRAOCULAR LENS PLACEMENT LEFT EYE;  CDE:  15.81;  Surgeon: Gemma Payor, MD;  Location: [facility];  Service: Ophthalmology;  Laterality: Left;   CHOLECYSTECTOMY  2011   COLONOSCOPY  07/03/2008   FRACTURE SURGERY  09/25/1966   [facility].    GALLBLADDER SURGERY  09/26/2007   Dr.Jenkins    HERNIA REPAIR Right 09/26/1991   Dr.Smith    PROSTATE SURGERY     cancer   SUPRAPUBIC PROSTATECTOMY  09/26/1995   Dr.Wrenn   VASECTOMY  09/25/1972   [facility].    Patient Active Problem List   Diagnosis Date Noted   Left-sided tinnitus 10/08/2023   Sensorineural hearing loss, bilateral 10/08/2023   History of prostate cancer 10/20/2020   Chest pain 09/04/2019   Hematuria 09/04/2019   Dysphagia 09/04/2019   Pain in unspecified hip    Knee pain    Edema    Anxiety disorder, unspecified     ONSET DATE: 11/28/2023   REFERRING DIAG: R13.10  (ICD-10-CM) - Dysphagia, unspecified type  THERAPY DIAG:  No diagnosis found.  Rationale for Evaluation and Treatment: Rehabilitation  SUBJECTIVE:   SUBJECTIVE STATEMENT: *** Pt accompanied by: {accompnied:27141}  PERTINENT HISTORY: ***  PAIN:  Are you having pain? {OPRCPAIN:27236}  FALLS: Has patient fallen in last 6 months?  {WUJWJXBJ:47829}  LIVING ENVIRONMENT: Lives with: {OPRC lives with:25569::"lives with their family"} Lives in: {Lives in:25570}  PLOF:  Level of assistance: {FAOZHYQ:65784} Employment: {SLPemployment:25674}  PATIENT GOALS: ***  OBJECTIVE:  Note: Objective measures were completed at Evaluation unless otherwise noted. OBJECTIVE:   INSTRUMENTAL SWALLOW STUDY FINDINGS (MBSS) 11/12/23 Pt has a primarily oral dysphagia, with prolonged lingual manipulation of boluses within his oral cavity prior to initiating posterior transit. This is observed with all consistencies, although less with nectar thick liquids compared to thin liquids, and often requires a posterior head tilt to begin lingual propulsion. No significant difference between use of spoon, cup, or straw. He has pretty good lingual control though, with minimal posterior spillage, brisk lingual movement once triggered, and good oral clearance. Pharyngeal function also seems to be fairly intact except for mildly reduced duration of UES opening that results in trace-mild pharyngeal residue. Aspiration occurred x1 while trying to take the barium tablet with thin liquids, but this was transient in nature (PAS 6). Pt also had c/o globus sensation  during the study when pharynx was noted to be clear. Asked if this could be impacting his readiness to orally transit boluses, but he wasn't sure. Note that this was observed to a lesser degree during MBS in 2021 and was felt to be more compensatory/behavioral in nature. Although etiology still remains unclear, this has progressed to a point that as the potential to impact  adequate nutrition and hydration and would therefore recommend OP SLP.  Objective swallow impairments: *** Objective recommended compensations: ***  COGNITION: Overall cognitive status: {cognition:24006} Areas of impairment:  {cognitiveimpairmentslp:27409} Functional deficits: ***  SUBJECTIVE DYSPHAGIA REPORTS:  Date of onset: *** Reported symptoms: {dysphagia symptoms:29766}  Current diet: {slpdiet:27196}  Co-morbid voice changes: {yes/no:20286}  FACTORS WHICH MAY INCREASE RISK OF ADVERSE EVENT IN PRESENCE OF ASPIRATION:  General health: {CSE general health:29764}  Risk factors: {CSE risk factors:29763}    ORAL MOTOR EXAMINATION: Overall status: {OMESLP2:27645} Comments: ***  CLINICAL SWALLOW ASSESSMENT:   Dentition: {CSE dentition:29769} Vocal quality at baseline: {VQL:27192} Patient directly observed with POs: {POobserved:27199} Feeding: {slp feeding:27200} Liquids provided by: {SLPliquids:27201} [facility] Swallow Protocol: {Pass/Fail (Optional):210140017} Oral phase signs and symptoms: {SLPoralphase:27202} Pharyngeal phase signs and symptoms: {SLPpharyngealphase:27203}  PATIENT REPORTED OUTCOME MEASURES (PROM): {SLPPROM:27095}                                                                                                                             TREATMENT DATE: ***   PATIENT EDUCATION: Education details: *** Person educated: {Person educated:25204} Education method: {Education Method:25205} Education comprehension: {Education Comprehension:25206}   ASSESSMENT:  CLINICAL IMPRESSION: Patient is a 88 y.o. M who was seen today for ***.   OBJECTIVE IMPAIRMENTS: include {SLPOBJIMP:27107}. These impairments are limiting patient from {SLPLIMIT:27108}. Factors affecting potential to achieve goals and functional outcome are {SLP potential:25450}. Patient will benefit from skilled SLP services to address above impairments and improve overall function.  REHAB POTENTIAL:  {rehabpotential:25112}   GOALS: Goals reviewed with patient? {yes/no:20286}  SHORT TERM GOALS: Target date: ***  *** Baseline: Goal status: INITIAL  2.  *** Baseline:  Goal status: INITIAL  3.  *** Baseline:  Goal status: INITIAL  4.  *** Baseline:  Goal status: INITIAL  5.  *** Baseline:  Goal status: INITIAL  6.  *** Baseline:  Goal status: INITIAL  LONG TERM GOALS: Target date: ***  *** Baseline:  Goal status: INITIAL  2.  *** Baseline:  Goal status: INITIAL  3.  *** Baseline:  Goal status: INITIAL  4.  *** Baseline:  Goal status: INITIAL  5.  *** Baseline:  Goal status: INITIAL  6.  *** Baseline:  Goal status: INITIAL  PLAN:  SLP FREQUENCY: {rehab frequency:25116}  SLP DURATION: {rehab duration:25117}  PLANNED INTERVENTIONS: {SLP treatment/interventions:25449}    Gracy Racer, CCC-SLP 12/19/2023, 8:10 AM

## 2023-12-20 ENCOUNTER — Telehealth: Payer: Self-pay

## 2023-12-20 NOTE — Telephone Encounter (Signed)
 Received call from Stacy with [facility] & clarified that referral was for speech therapy, and not another swallow study. She will reach out to patient.

## 2023-12-31 ENCOUNTER — Ambulatory Visit (INDEPENDENT_AMBULATORY_CARE_PROVIDER_SITE_OTHER): Payer: Medicare HMO | Admitting: Nurse Practitioner

## 2023-12-31 ENCOUNTER — Encounter: Payer: Self-pay | Admitting: Nurse Practitioner

## 2023-12-31 VITALS — BP 126/72 | HR 50 | Temp 96.3°F | Resp 16 | Ht 68.0 in | Wt 185.6 lb

## 2023-12-31 DIAGNOSIS — K219 Gastro-esophageal reflux disease without esophagitis: Secondary | ICD-10-CM

## 2023-12-31 DIAGNOSIS — M545 Low back pain, unspecified: Secondary | ICD-10-CM | POA: Diagnosis not present

## 2023-12-31 DIAGNOSIS — G8929 Other chronic pain: Secondary | ICD-10-CM | POA: Diagnosis not present

## 2023-12-31 DIAGNOSIS — M150 Primary generalized (osteo)arthritis: Secondary | ICD-10-CM | POA: Diagnosis not present

## 2023-12-31 DIAGNOSIS — G3184 Mild cognitive impairment, so stated: Secondary | ICD-10-CM

## 2023-12-31 DIAGNOSIS — M7062 Trochanteric bursitis, left hip: Secondary | ICD-10-CM

## 2023-12-31 DIAGNOSIS — R1310 Dysphagia, unspecified: Secondary | ICD-10-CM

## 2023-12-31 DIAGNOSIS — I10 Essential (primary) hypertension: Secondary | ICD-10-CM

## 2023-12-31 MED ORDER — PREDNISONE 10 MG (21) PO TBPK: ORAL_TABLET | ORAL | 0 refills | Status: DC

## 2023-12-31 NOTE — Patient Instructions (Signed)
 To use prednisone dose pack for hip and back Ice left hip three times daily Heat to low back three times daily Avoid NSAID Okay to use tylenol 1000 mg by mouth every 8 hours for pain  PT ordered

## 2023-12-31 NOTE — Progress Notes (Signed)
 Careteam: Patient Care Team: Sharon Seller, NP as PCP - General (Geriatric Medicine) Wendall Stade, MD as Consulting Physician (Cardiology) Bjorn Pippin, MD as Attending Physician (Urology)  PLACE OF SERVICE:  [facility]  Advanced Directive information Does Patient Have a Medical Advance Directive?: Yes, Type of Advance Directive: Living will, Does patient want to make changes to medical advance directive?: No - Patient declined  Allergies  Allergen Reactions   Beef-Derived Drug Products Nausea And Vomiting    Chief Complaint  Patient presents with   Medical Management of Chronic Issues    6 month follow up. Discuss the need for Covid Booster, and DTAP vaccine.    Discussed the use of AI scribe software for clinical note transcription with the patient, who gave verbal consent to proceed.  History of Present Illness   Kevin [name] is an 88 year old male with arthritis who presents for a six-month follow-up visit.  He experiences significant pain in his hip, leg, knee, and ankle, which has worsened over time. Initially, the pain was on the right side but has now shifted to the left, making ambulation difficult. The hip pain is constant and exacerbated by movement. He uses a knee brace and occasionally an ankle brace for pain management.   He is experiencing dysphagia and is scheduled for swallowing exercises. He has been following up with a gastrointestinal specialist for this issue.  He has a history of acid reflux, which has improved with medication. He takes Tums twice daily and Nexium once daily, although he sometimes struggles to take Nexium before meals as prescribed. No recent indigestion or acid reflux.  He reports memory issues, particularly with short-term memory, and has been diagnosed with mild neurocognitive disorder. He follows up with a neurologist every six months.  He experiences constipation and uses MiraLAX and fiber supplements to manage it,  although he sometimes forgets to take them regularly.       Review of Systems:  Review of Systems  Constitutional:  Negative for chills, fever and weight loss.  HENT:  Negative for tinnitus.   Respiratory:  Negative for cough, sputum production and shortness of breath.   Cardiovascular:  Negative for chest pain, palpitations and leg swelling.  Gastrointestinal:  Positive for heartburn. Negative for abdominal pain, constipation and diarrhea.  Genitourinary:  Negative for dysuria, frequency and urgency.  Musculoskeletal:  Positive for back pain and joint pain. Negative for falls and myalgias.  Skin: Negative.   Neurological:  Negative for dizziness and headaches.  Psychiatric/Behavioral:  Negative for depression. The patient does not have insomnia.     Past Medical History:  Diagnosis Date   Anxiety disorder, unspecified    Arthritis    Cancer (HCC)    prostate   Cataract    Edema    Erectile dysfunction    Glaucoma    History of blood in urine    History of cataract surgery 09/26/2015   Both Eyes by Dr.Hunt.   History of colonoscopy 09/26/2007   Dr.LeBaur   Indigestion    Knee pain    Pain in unspecified hip    Prostate cancer [facility])    Urinary incontinence    Past Surgical History:  Procedure Laterality Date   CATARACT EXTRACTION W/PHACO Right 01/20/2016   Procedure: CATARACT EXTRACTION PHACO AND INTRAOCULAR LENS PLACEMENT (IOC);  Surgeon: Gemma Payor, MD;  Location: [facility];  Service: Ophthalmology;  Laterality: Right;  CDE 15.42   CATARACT EXTRACTION W/PHACO Left  03/06/2016   Procedure: CATARACT EXTRACTION PHACO AND INTRAOCULAR LENS PLACEMENT LEFT EYE;  CDE:  15.81;  Surgeon: Gemma Payor, MD;  Location: [facility];  Service: Ophthalmology;  Laterality: Left;   CHOLECYSTECTOMY  2011   COLONOSCOPY  07/03/2008   FRACTURE SURGERY  09/25/1966   [facility].    GALLBLADDER SURGERY  09/26/2007   Dr.Jenkins    HERNIA REPAIR Right 09/26/1991   Dr.Smith    PROSTATE SURGERY      cancer   SUPRAPUBIC PROSTATECTOMY  09/26/1995   Dr.Wrenn   VASECTOMY  09/25/1972   [facility].    Social History:   reports that he quit smoking about 19 years ago. His smoking use included cigarettes and cigars. He started smoking about 35 years ago. He has a 8 pack-year smoking history. He has never used smokeless tobacco. He reports current alcohol use of about 1.0 standard drink of alcohol per week. He reports that he does not use drugs.  Family History  Problem Relation Age of Onset   Healthy Mother    Hypertension Mother    Dementia Mother    Heart disease Father    Dementia Father    Heart failure Father    Heart disease Brother    Stroke Brother    Hypertension Son    Stroke Brother     Medications: Patient's Medications  New Prescriptions   No medications on file  Previous Medications   ACETAMINOPHEN (TYLENOL) 325 MG TABLET    Take 650 mg by mouth every 6 (six) hours as needed.   CALCIUM CARBONATE (TUMS EX) 750 MG CHEWABLE TABLET    Chew 1 tablet by mouth as needed for heartburn.   DICLOFENAC SODIUM (VOLTAREN) 1 % GEL    Apply 4 g topically 2 (two) times daily as needed. Apply to right knee and ankle   ESOMEPRAZOLE (NEXIUM) 40 MG CAPSULE    Take 1 capsule (40 mg total) by mouth 2 (two) times daily before a meal.   MAGNESIUM PO    Take 2 Doses by mouth at bedtime. 250 mg chewable total of 500 mg   MECLIZINE (ANTIVERT) 25 MG TABLET    Take 1 tablet (25 mg total) by mouth 3 (three) times daily as needed for dizziness.   MENTHOL, TOPICAL ANALGESIC, (BIOFREEZE) 4 % GEL    Apply topically daily as needed.   ONDANSETRON (ZOFRAN-ODT) 4 MG DISINTEGRATING TABLET    4mg  ODT q4 hours prn nausea/vomit   VITAMIN B-12 (CYANOCOBALAMIN) 1000 MCG TABLET    Take 1,000 mcg by mouth daily.  Modified Medications   No medications on file  Discontinued Medications   No medications on file    Physical Exam:  Vitals:   12/31/23 0838  BP: 126/72  Pulse: (!) 50  Resp: 16   Temp: (!) 96.3 F (35.7 C)  SpO2: 95%  Weight: 185 lb 9.6 oz (84.2 kg)  Height: 5\' 8"  (1.727 m)   Body mass index is 28.22 kg/m. Wt Readings from Last 3 Encounters:  12/31/23 185 lb 9.6 oz (84.2 kg)  10/08/23 182 lb (82.6 kg)  10/05/23 180 lb (81.6 kg)    Physical Exam Constitutional:      General: He is not in acute distress.    Appearance: He is well-developed. He is not diaphoretic.  HENT:     Head: Normocephalic and atraumatic.     Right Ear: External ear normal.     Left Ear: External ear normal.  Mouth/Throat:     Pharynx: No oropharyngeal exudate.  Eyes:     Conjunctiva/sclera: Conjunctivae normal.     Pupils: Pupils are equal, round, and reactive to light.  Cardiovascular:     Rate and Rhythm: Normal rate and regular rhythm.     Heart sounds: Normal heart sounds.  Pulmonary:     Effort: Pulmonary effort is normal.     Breath sounds: Normal breath sounds.  Abdominal:     General: Bowel sounds are normal.     Palpations: Abdomen is soft.  Musculoskeletal:     Cervical back: Normal range of motion and neck supple.     Lumbar back: Tenderness present.     Right hip: No crepitus. Normal range of motion.     Left hip: No crepitus. Normal range of motion.     Left upper leg: Tenderness (noted over left trochanteric bursa) present.     Right lower leg: No edema.     Left lower leg: No edema.       Legs:  Skin:    General: Skin is warm and dry.  Neurological:     Mental Status: He is alert and oriented to person, place, and time.    Labs reviewed: Basic Metabolic Panel: Recent Labs    01/01/23 0928 07/02/23 0910 08/15/23 1931  NA 141 140 136  K 4.6 4.6 3.9  CL 106 103 106  CO2 30 27 23   GLUCOSE 92 98 101*  BUN 18 15 15   CREATININE 1.16 1.27* 1.20  CALCIUM 8.9 9.9 8.6*  TSH 1.18  --   --    Liver Function Tests: Recent Labs    01/01/23 0928 07/02/23 0910  AST 17 17  ALT 15 15  BILITOT 1.0 1.2  PROT 6.4 6.8   No results for input(s):  "LIPASE", "AMYLASE" in the last 8760 hours. No results for input(s): "AMMONIA" in the last 8760 hours. CBC: Recent Labs    01/01/23 0928 07/02/23 0910 08/15/23 1931  WBC 5.9 6.0 8.5  NEUTROABS 3,481 3,210  --   HGB 15.2 15.8 15.5  HCT 44.5 48.0 45.4  MCV 88.1 90.7 88.2  PLT 177 179 177   Lipid Panel: No results for input(s): "CHOL", "HDL", "LDLCALC", "TRIG", "CHOLHDL", "LDLDIRECT" in the last 8760 hours. TSH: Recent Labs    01/01/23 0928  TSH 1.18   A1C: No results found for: "HGBA1C"   Assessment/Plan Assessment and Plan    Trochanteric Bursitis -left hip pain due to trochanteric bursitis,  - Order outpatient physical therapy for bursitis. - Prescribe prednisone dose pack for inflammation. - Apply ice to the hip three times a day. - Avoid NSAIDs. - Take Tylenol 1000 mg by mouth every eight hours for pain.  Low Back Pain Tenderness to lumbar spine and noted pain with movement of legs. No sciatica noted. - Order outpatient physical therapy for low back pain. - Apply heat to the back three times a day. - Avoid NSAIDs. - Take Tylenol 1000 mg by mouth every eight hours for pain.  Gastroesophageal Reflux Disease (GERD) -encouraged use of nexium daily Followed by GI To only use tums if needed - Order blood work to monitor calcium levels due to frequent tums use - Ensure Nexium is taken once daily before meals.  Dysphagia Following up with GI for swallowing difficulties, therapy scheduled.  Constipation Intermittent constipation, inconsistent MiraLAX use effects efficacy. - Encourage consistent use of MiraLAx.   Mild Neurocognitive Disorder Short-term memory issues consistent with mild  neurocognitive disorder, follow-up with neurologist in six months. Continues with supportive care with wife.     Return in about 6 months (around 07/01/2024) for routine follow up.  Janene Harvey. Biagio Borg [facility] [phone]

## 2024-01-01 ENCOUNTER — Encounter: Payer: Self-pay | Admitting: Nurse Practitioner

## 2024-01-01 LAB — CBC WITH DIFFERENTIAL/PLATELET
Absolute Lymphocytes: 1897 {cells}/uL (ref 850–3900)
Absolute Monocytes: 546 {cells}/uL (ref 200–950)
Basophils Absolute: 50 {cells}/uL (ref 0–200)
Basophils Relative: 0.8 %
Eosinophils Absolute: 62 {cells}/uL (ref 15–500)
Eosinophils Relative: 1 %
HCT: 46.2 % (ref 38.5–50.0)
Hemoglobin: 15.7 g/dL (ref 13.2–17.1)
MCH: 30.3 pg (ref 27.0–33.0)
MCHC: 34 g/dL (ref 32.0–36.0)
MCV: 89 fL (ref 80.0–100.0)
MPV: 10 fL (ref 7.5–12.5)
Monocytes Relative: 8.8 %
Neutro Abs: 3646 {cells}/uL (ref 1500–7800)
Neutrophils Relative %: 58.8 %
Platelets: 199 10*3/uL (ref 140–400)
RBC: 5.19 10*6/uL (ref 4.20–5.80)
RDW: 12.4 % (ref 11.0–15.0)
Total Lymphocyte: 30.6 %
WBC: 6.2 10*3/uL (ref 3.8–10.8)

## 2024-01-01 LAB — COMPLETE METABOLIC PANEL WITHOUT GFR
AG Ratio: 1.9 (calc) (ref 1.0–2.5)
ALT: 16 U/L (ref 9–46)
AST: 18 U/L (ref 10–35)
Albumin: 4.4 g/dL (ref 3.6–5.1)
Alkaline phosphatase (APISO): 56 U/L (ref 35–144)
BUN/Creatinine Ratio: 13 (calc) (ref 6–22)
BUN: 17 mg/dL (ref 7–25)
CO2: 27 mmol/L (ref 20–32)
Calcium: 9.7 mg/dL (ref 8.6–10.3)
Chloride: 104 mmol/L (ref 98–110)
Creat: 1.29 mg/dL — ABNORMAL HIGH (ref 0.70–1.22)
Globulin: 2.3 g/dL (ref 1.9–3.7)
Glucose, Bld: 92 mg/dL (ref 65–99)
Potassium: 4.7 mmol/L (ref 3.5–5.3)
Sodium: 140 mmol/L (ref 135–146)
Total Bilirubin: 1 mg/dL (ref 0.2–1.2)
Total Protein: 6.7 g/dL (ref 6.1–8.1)

## 2024-01-10 ENCOUNTER — Other Ambulatory Visit: Payer: Medicare HMO

## 2024-01-10 DIAGNOSIS — Z8546 Personal history of malignant neoplasm of prostate: Secondary | ICD-10-CM | POA: Diagnosis not present

## 2024-01-10 DIAGNOSIS — R9721 Rising PSA following treatment for malignant neoplasm of prostate: Secondary | ICD-10-CM | POA: Diagnosis not present

## 2024-01-11 LAB — PSA: Prostate Specific Ag, Serum: 0.7 ng/mL (ref 0.0–4.0)

## 2024-01-17 ENCOUNTER — Encounter: Payer: Self-pay | Admitting: Urology

## 2024-01-17 ENCOUNTER — Ambulatory Visit: Payer: Medicare HMO | Admitting: Urology

## 2024-01-17 VITALS — BP 161/60 | HR 75

## 2024-01-17 DIAGNOSIS — N393 Stress incontinence (female) (male): Secondary | ICD-10-CM

## 2024-01-17 DIAGNOSIS — R9721 Rising PSA following treatment for malignant neoplasm of prostate: Secondary | ICD-10-CM | POA: Diagnosis not present

## 2024-01-17 DIAGNOSIS — Z8546 Personal history of malignant neoplasm of prostate: Secondary | ICD-10-CM

## 2024-01-17 DIAGNOSIS — R351 Nocturia: Secondary | ICD-10-CM

## 2024-01-17 DIAGNOSIS — R3915 Urgency of urination: Secondary | ICD-10-CM

## 2024-01-17 LAB — URINALYSIS, ROUTINE W REFLEX MICROSCOPIC
Bilirubin, UA: NEGATIVE
Glucose, UA: NEGATIVE
Leukocytes,UA: NEGATIVE
Nitrite, UA: NEGATIVE
RBC, UA: NEGATIVE
Specific Gravity, UA: 1.02 (ref 1.005–1.030)
Urobilinogen, Ur: 1 mg/dL (ref 0.2–1.0)
pH, UA: 6.5 (ref 5.0–7.5)

## 2024-01-17 LAB — BLADDER SCAN: Scan Result: 0

## 2024-01-17 NOTE — Progress Notes (Unsigned)
 Subjective: 1. History of prostate cancer   2. Rising PSA following treatment for malignant neoplasm of prostate   3. Male stress incontinence   4. Nocturia   5. Urgency of urination     01/17/24: Kevin [name] returns today in f/u for his history below.  His PSA is stable at 0.7.  His IPSS is 30 with nocturia x 3.  He feels his symptoms are stable but he fills his depends quickly.  He has pain prior to voiding that have gotten progressively worse over the last 6 months.  He associates the pain with his bowels.  He has had no hematuria.  He has a variable stream that starts weak and he tries to empty all the way.   His UA is clear.   4/18/24[name] returns today in f/u for his history of prostate cancer with a slowly rising PSA post prostatectomy in 1997, hematuria and incontinence. His PSA is stable at 0.7 on 01/05/23.  He has a normal CBC and CMP on 01/01/23.  His IPSS is 22 with nocturia x 3 and urgency with intermittency.  He has incontinence and is using 3ppd.  He has had no recurrent hematuria.  His UA is clear.     4/20/23[name] returns today in f/u for his history of prostate cancer and hematuria.  He has incontinence that requires 3ppd. His IPSS is 20 with a reduced stream and nocturia x 3. p His PSA is up further to 0.7 this year. It was 0.6 in 4/22, 0.4 in 2/21 and 0.5 in 12/20.   He has no weight loss or bone pain.    4/21/22[name] returns today in f/u.  He had a negative hematuria w/u last year.  His PSA in 2/21 was stable at 0.4.  He had a radical prostatectomy in 1997.  He has incontinence and is using 3ppd.  He has worse leakage with activity. His IPSS is 17.     01/16/20: Kevin [name] is an 88 yo WM who is sent back in consultation by Dr. Sissy [name] for an episode of gross hematuria in November.   He had blood on 3 voids with dysuria.  He has not seen it since.   He has no history of stones or UTI's.  He had some low back pain at that time as well.  He has a history of prostate cancer.  He  had a radical prostatectomy in about 1997 and was last seen by me in 2002.   His recent PSA was 0.5.  The last I had recorded was 0.03.   He has had postop SUI and ED.   His UA was clear on 09/04/19 and today.   His Cr was 1.2 on 09/04/19.  He has moderate to severe LUTS with an IPSS of 22.  He wears about 3ppd.   IPSS     Row Name 01/17/24 1300         International Prostate Symptom Score   How often have you had the sensation of not emptying your bladder? Almost always     How often have you had to urinate less than every two hours? Less than half the time     How often have you found you stopped and started again several times when you urinated? Almost always     How often have you found it difficult to postpone urination? Almost always     How often have you had a weak urinary stream? Almost always  How often have you had to strain to start urination? Almost always     How many times did you typically get up at night to urinate? 3 Times     Total IPSS Score 30       Quality of Life due to urinary symptoms   If you were to spend the rest of your life with your urinary condition just the way it is now how would you feel about that? Terrible               ROS:  Review of Systems  Constitutional:  Positive for malaise/fatigue.  HENT:  Positive for congestion and sore throat.   Eyes:  Positive for blurred vision.  Respiratory:  Negative for shortness of breath.   Cardiovascular:  Negative for chest pain.  Gastrointestinal:        Occasional LLQ pain.   Musculoskeletal:  Positive for back pain and joint pain.  Skin:  Positive for itching.  Psychiatric/Behavioral:  Positive for memory loss.     Allergies  Allergen Reactions   Beef-Derived Drug Products Nausea And Vomiting    Past Medical History:  Diagnosis Date   Anxiety disorder, unspecified    Arthritis    Cancer (HCC)    prostate   Cataract    Edema    Erectile dysfunction    Glaucoma    History of blood  in urine    History of cataract surgery 09/26/2015   Both Eyes by Dr.Hunt.   History of colonoscopy 09/26/2007   Dr.LeBaur   Indigestion    Knee pain    Pain in unspecified hip    Prostate cancer [facility])    Urinary incontinence     Past Surgical History:  Procedure Laterality Date   CATARACT EXTRACTION W/PHACO Right 01/20/2016   Procedure: CATARACT EXTRACTION PHACO AND INTRAOCULAR LENS PLACEMENT (IOC);  Surgeon: Kevin Kill, MD;  Location: [facility];  Service: Ophthalmology;  Laterality: Right;  CDE 15.42   CATARACT EXTRACTION W/PHACO Left 03/06/2016   Procedure: CATARACT EXTRACTION PHACO AND INTRAOCULAR LENS PLACEMENT LEFT EYE;  CDE:  15.81;  Surgeon: Kevin Kill, MD;  Location: [facility];  Service: Ophthalmology;  Laterality: Left;   CHOLECYSTECTOMY  2011   COLONOSCOPY  07/03/2008   FRACTURE SURGERY  09/25/1966   [facility].    GALLBLADDER SURGERY  09/26/2007   Kevin [name]    HERNIA REPAIR Right 09/26/1991   Dr.Smith    PROSTATE SURGERY     cancer   SUPRAPUBIC PROSTATECTOMY  09/26/1995   Kevin [name]   VASECTOMY  09/25/1972   [facility].     Social History   Socioeconomic History   Marital status: Married    Spouse name: Not on file   Number of children: 3   Years of education: Not on file   Highest education level: GED or equivalent  Occupational History   Occupation: retired  Tobacco Use   Smoking status: Former    Current packs/day: 0.00    Average packs/day: 0.5 packs/day for 16.0 years (8.0 ttl pk-yrs)    Types: Cigarettes, Cigars    Start date: 01/18/1988    Quit date: 01/18/2004    Years since quitting: 20.0   Smokeless tobacco: Never  Vaping Use   Vaping status: Never Used  Substance and Sexual Activity   Alcohol use: Yes    Alcohol/week: 1.0 standard drink of alcohol    Types: 1 Cans of beer per week    Comment: 1 beer week  Drug use: No   Sexual activity: Not Currently    Birth control/protection: None  Other Topics Concern   Not on file   Social History Narrative   Tobacco use, amount per day now: Non smoker for 16 years.   Past tobacco use, amount per day: 1/2 Pack of cigarettes a day or 3 cigars.    How many years did you use tobacco: 15 years.   Alcohol use (drinks per week): 0-2 drinks.   Diet: Normal.    Do you drink/eat things with caffeine: Yes.   Marital status:  Married                                What year were you married? 1981   Do you live in a house, apartment, assisted living, condo, trailer, etc.? House.   Is it one or more stories? 2 stories.    How many persons live in your home? 2   Do you have pets in your home?( please list)  Yes 2 dogs, 1 cat.   Highest Level of education completed: High School GED.   Current or past profession: Estate manager/land agent.    Do you exercise? Not regularly.                                    Type and how often? Working outside on yard.    Do you have a living will? Yes.   Do you have a DNR form?  Yes.                                 If not, do you want to discuss one?   Do you have signed POA/HPOA forms? Yes.                       If so, please bring to you appointment    Do you have any difficulty bathing or dressing yourself? No.    Do you have difficulty preparing food or eating? No.   Do you have difficulty managing your medications? No.   Do you have any difficulty managing your finances? No.    Do you have any difficulty affording your medications? No.       Social Drivers of Health   Financial Resource Strain: Medium Risk (12/27/2023)   Overall Financial Resource Strain (CARDIA)    Difficulty of Paying Living Expenses: Somewhat hard  Food Insecurity: No Food Insecurity (12/27/2023)   Hunger Vital Sign    Worried About Running Out of Food in the Last Year: Never true    Ran Out of Food in the Last Year: Never true  Transportation Needs: No Transportation Needs (12/27/2023)   PRAPARE - Administrator, Civil Service (Medical): No     Lack of Transportation (Non-Medical): No  Physical Activity: Insufficiently Active (12/27/2023)   Exercise Vital Sign    Days of Exercise per Week: 1 day    Minutes of Exercise per Session: 20 min  Stress: Stress Concern Present (12/27/2023)   [employer] of Occupational Health - Occupational Stress Questionnaire    Feeling of Stress : To some extent  Social Connections: Socially Integrated (12/27/2023)   Social Connection and Isolation Panel [NHANES]    Frequency of Communication with  Friends and Family: Twice a week    Frequency of Social Gatherings with Friends and Family: Twice a week    Attends Religious Services: More than 4 times per year    Active Member of [employer] or Organizations: Yes    Attends Engineer, structural: More than 4 times per year    Marital Status: Married  Catering manager Violence: Not At Risk (12/31/2023)   Humiliation, Afraid, Rape, and Kick questionnaire    Fear of Current or Ex-Partner: No    Emotionally Abused: No    Physically Abused: No    Sexually Abused: No    Family History  Problem Relation Age of Onset   Healthy Mother    Hypertension Mother    Dementia Mother    Heart disease Father    Dementia Father    Heart failure Father    Heart disease Brother    Stroke Brother    Hypertension Son    Stroke Brother     Anti-infectives: Anti-infectives (From admission, onward)    None       Current Outpatient Medications  Medication Sig Dispense Refill   acetaminophen (TYLENOL) 325 MG tablet Take 650 mg by mouth every 6 (six) hours as needed.     calcium carbonate (TUMS EX) 750 MG chewable tablet Chew 1 tablet by mouth as needed for heartburn.     diclofenac Sodium (VOLTAREN) 1 % GEL Apply 4 g topically 2 (two) times daily as needed. Apply to right knee and ankle     esomeprazole  (NEXIUM ) 40 MG capsule Take 1 capsule (40 mg total) by mouth 2 (two) times daily before a meal. 60 capsule 1   MAGNESIUM  PO Take 2 Doses by mouth at  bedtime. 250 mg chewable total of 500 mg     meclizine  (ANTIVERT ) 25 MG tablet Take 1 tablet (25 mg total) by mouth 3 (three) times daily as needed for dizziness. 20 tablet 0   Menthol, Topical Analgesic, (BIOFREEZE) 4 % GEL Apply topically daily as needed.     ondansetron  (ZOFRAN -ODT) 4 MG disintegrating tablet 4mg  ODT q4 hours prn nausea/vomit 10 tablet 0   predniSONE  (STERAPRED UNI-PAK 21 TAB) 10 MG (21) TBPK tablet Use as directed 21 tablet 0   vitamin B-12 (CYANOCOBALAMIN ) 1000 MCG tablet Take 1,000 mcg by mouth daily.     No current facility-administered medications for this visit.     Objective: BP (!) 161/60   Pulse 75    Physical Exam Vitals reviewed.  Constitutional:      Appearance: Normal appearance.  Abdominal:     General: Abdomen is flat.     Palpations: Abdomen is soft.     Tenderness: There is abdominal tenderness (mid abdominal.).     Hernia: No hernia is present.  Genitourinary:    Comments: Nl uncircumcised phallus Scrotum, testes and epididymis normal. AP/NST without lesions or mass. Vault is empty. Prostate surgically absent.  Neurological:     Mental Status: He is alert.     Lab Results:  Results for orders placed or performed in visit on 01/17/24 (from the past 24 hours)  Urinalysis, Routine w reflex microscopic     Status: Abnormal   Collection Time: 01/17/24  1:28 PM  Result Value Ref Range   Specific Gravity, UA 1.020 1.005 - 1.030   pH, UA 6.5 5.0 - 7.5   Color, UA Yellow Yellow   Appearance Ur Clear Clear   Leukocytes,UA Negative Negative   Protein,UA Trace Negative/Trace  Glucose, UA Negative Negative   Ketones, UA Trace (A) Negative   RBC, UA Negative Negative   Bilirubin, UA Negative Negative   Urobilinogen, Ur 1.0 0.2 - 1.0 mg/dL   Nitrite, UA Negative Negative   Microscopic Examination Comment    Narrative   Performed at:  [address] - [facility] [address] [place], [place]  [id number] Lab Director: Liliana Regulus MT,  Phone:  [phone]       Lab Results  Component Value Date   PSA1 0.7 01/10/2024   PSA1 0.7 01/05/2023   PSA1 0.7 01/05/2022    BMET No results for input(s): "NA", "K", "CL", "CO2", "GLUCOSE", "BUN", "CREATININE", "CALCIUM" in the last 72 hours. PT/INR No results for input(s): "LABPROT", "INR" in the last 72 hours. ABG No results for input(s): "PHART", "HCO3" in the last 72 hours.  Invalid input(s): "PCO2", "PO2" UA is clear.  Studies/Results: No results found.   Assessment/Plan: Gross hematuria.    He has had no recurrent bleeding.   Prostate cancer with rising PSA s/p prostatectomy.   His PSA stable over stable over the few years.  PSA in a year.  SUI with severe LUTS.  I will have him see Dr. Jarvis Mesa to discuss options for treatment as it has worsened.   Mid abdominal pain.   I have recommended he discuss this with his PCP.   No orders of the defined types were placed in this encounter.    Orders Placed This Encounter  Procedures   Urinalysis, Routine w reflex microscopic   PSA    Standing Status:   Future    Expected Date:   01/09/2025    Expiration Date:   01/16/2025   Ambulatory referral to Urology    Referral Priority:   Routine    Referral Type:   Consultation    Referral Reason:   Specialty Services Required    Referred to Provider:   Mallie Seal, MD    Requested Specialty:   Urology    Number of Visits Requested:   1   Bladder scan     Return in about 1 year (around 01/16/2025) for with available provider with PSA. Kevin [name]    CC: Dr. Johny Nap.      Homero Luster 01/18/2024 [phone]

## 2024-01-17 NOTE — Progress Notes (Unsigned)
 post void residual=0 ?

## 2024-01-18 ENCOUNTER — Encounter: Payer: Self-pay | Admitting: Urology

## 2024-01-22 ENCOUNTER — Other Ambulatory Visit: Payer: Self-pay

## 2024-01-22 ENCOUNTER — Ambulatory Visit (HOSPITAL_COMMUNITY): Attending: Nurse Practitioner | Admitting: Speech Pathology

## 2024-01-22 ENCOUNTER — Encounter (HOSPITAL_COMMUNITY): Payer: Self-pay | Admitting: Speech Pathology

## 2024-01-22 DIAGNOSIS — R1310 Dysphagia, unspecified: Secondary | ICD-10-CM | POA: Diagnosis not present

## 2024-01-22 NOTE — Therapy (Signed)
 OUTPATIENT SPEECH LANGUAGE PATHOLOGY SWALLOW EVALUATION   Patient Name: Kevin [name] MRN: [id number] DOB:28-Mar-1936, 88 y.o., male Today's Date: 01/22/2024  PCP: Verma Gobble, NP REFERRING PROVIDER: Kenney Peacemaker, MD  END OF SESSION:  End of Session - 01/22/24 1546     Visit Number 1    Number of Visits 1    Authorization Type [employer] Medicare   aetna eff: 09/25/20 oop: [id number] copay 10 auth no availity   SLP Start Time 1515    SLP Stop Time  1600    SLP Time Calculation (min) 45 min    Activity Tolerance Patient tolerated treatment well             Past Medical History:  Diagnosis Date   Anxiety disorder, unspecified    Arthritis    Cancer (HCC)    prostate   Cataract    Edema    Erectile dysfunction    Glaucoma    History of blood in urine    History of cataract surgery 09/26/2015   Both Eyes by Dr.Hunt.   History of colonoscopy 09/26/2007   Dr.LeBaur   Indigestion    Knee pain    Pain in unspecified hip    Prostate cancer [facility])    Urinary incontinence    Past Surgical History:  Procedure Laterality Date   CATARACT EXTRACTION W/PHACO Right 01/20/2016   Procedure: CATARACT EXTRACTION PHACO AND INTRAOCULAR LENS PLACEMENT (IOC);  Surgeon: Anner Kill, MD;  Location: [facility];  Service: Ophthalmology;  Laterality: Right;  CDE 15.42   CATARACT EXTRACTION W/PHACO Left 03/06/2016   Procedure: CATARACT EXTRACTION PHACO AND INTRAOCULAR LENS PLACEMENT LEFT EYE;  CDE:  15.81;  Surgeon: Anner Kill, MD;  Location: [facility];  Service: Ophthalmology;  Laterality: Left;   CHOLECYSTECTOMY  2011   COLONOSCOPY  07/03/2008   FRACTURE SURGERY  09/25/1966   [facility].    GALLBLADDER SURGERY  09/26/2007   Dr.Jenkins    HERNIA REPAIR Right 09/26/1991   Dr.Smith    PROSTATE SURGERY     cancer   SUPRAPUBIC PROSTATECTOMY  09/26/1995   Dr.Wrenn   VASECTOMY  09/25/1972   [facility].    Patient Active Problem List   Diagnosis Date Noted   Left-sided  tinnitus 10/08/2023   Sensorineural hearing loss, bilateral 10/08/2023   History of prostate cancer 10/20/2020   Chest pain 09/04/2019   Hematuria 09/04/2019   Dysphagia 09/04/2019   Pain in unspecified hip    Knee pain    Edema    Anxiety disorder, unspecified     ONSET DATE: 11/12/2023   REFERRING DIAG: R13.10 (ICD-10-CM) - Dysphagia, unspecified type  THERAPY DIAG:  Dysphagia, unspecified type  Rationale for Evaluation and Treatment: Rehabilitation  SUBJECTIVE:   SUBJECTIVE STATEMENT: "I don't really know why I am here."  Pt accompanied by: self  PERTINENT HISTORY: Kevin [name] is an 88 yo male who was referred by Dr. Loy Ruff for a clinical swallow evaluation and treatment following recommendations from MBSS completed 11/12/2023. He has a past medical history of anxiety, mild memory impairment,  prostate cancer, GERD, dysphagia and colon polyps.  Pt reports globus sensation, difficulty masticating due to his jaw "locking up," and trouble getting boluses from his oral cavity to his pharynx. He mostly notices this with pills and tough, dense meats. Esophagram 1/15 showed significant difficulty transiting boluses orally and intermittent penetration, as well as transient retention of the tablet in the L pyriform sinus. Esophageal motility was [facility].  Pt had a previous MBS in 2021 with pharyngeal function WNL. He did have some delayed oral transit and piecemeal swallowing intermittently, which was felt to be compensatory in nature. He describes holding liquids in his mouth before he swallows. Pills get hung up in the right upper esophagus, he shakes and rotates his neck to facilitate the passage of the pill down the esophagus. Food sometime gets briefly hung up in the throat or upper esophagus which occurs a few times weekly. He drinks water or wait a little while and the food goes down.  PAIN:  Are you having pain? No  FALLS: Has patient fallen in last 6 months?  No  LIVING  ENVIRONMENT: Lives with: lives with their spouse Lives in: House/apartment  PLOF:  Level of assistance: Independent with ADLs, Independent with IADLs Employment: Retired  PATIENT GOALS: Swallow pills better  OBJECTIVE:  Note: Objective measures were completed at Evaluation unless otherwise noted. OBJECTIVE:   DIAGNOSTIC FINDINGS: N/A  INSTRUMENTAL SWALLOW STUDY FINDINGS (MBSS) (11/12/2023) Objective swallow impairments: Pt has a primarily oral dysphagia, with prolonged lingual manipulation of boluses within his oral cavity prior to initiating posterior transit. This is observed with all consistencies, although less with nectar thick liquids compared to thin liquids, and often requires a posterior head tilt to begin lingual propulsion. No significant difference between use of spoon, cup, or straw. He has pretty good lingual control though, with minimal posterior spillage, brisk lingual movement once triggered, and good oral clearance. Pharyngeal function also seems to be fairly intact except for mildly reduced duration of UES opening that results in trace-mild pharyngeal residue. Aspiration occurred x1 while trying to take the barium tablet with thin liquids, but this was transient in nature (PAS 6). Pt also had c/o globus sensation during the study when pharynx was noted to be clear. Asked if this could be impacting his readiness to orally transit boluses, but he wasn't sure. Note that this was observed to a lesser degree during MBS in 2021 and was felt to be more compensatory/behavioral in nature. Although etiology still remains unclear, this has progressed to a point that as the potential to impact adequate nutrition and hydration and would therefore recommend OP SLP.  Objective recommended compensations: Swallowing strategies  : Slow rate;Small bites/sips Postural changes: Position pt fully upright for meals;Stay upright 30-60 min after meals  Modified barium swallow study with speech  pathologist 03/09/2020: Pt has a functional oropharyngeal swallow without aspiration observed. He does have delayed oral transit and at times piecemeal swallowing, noted primarily thin liquids via cup and thin liquids with barium tablet. This appears to be compensatory in nature as opposed to inability to transit boluses. His pharyngeal phase is [facility]. Pt did report the sensation of the barium tablet being stuck in his throat when it was observed to be further down in his esophagus, even after it had cleared his esophagus. After testing was completed, pt was also observed performing additional dry swallows, saying that he needed to "let everything get to his stomach." Question if his symptoms are more esophageal in nature, but there is no observed pharyngeal dysphagia during this study.   COGNITION: Overall cognitive status:    He reports memory issues, particularly with short-term memory, and has been diagnosed with mild neurocognitive disorder. He follows up with a neurologist every six months. Areas of impairment:  N/A Functional deficits: N/A  SUBJECTIVE DYSPHAGIA REPORTS:  Date of onset: ~10/2023 Reported symptoms: globus sensation and difficulty chewing foods  Current diet:  regular and thin liquids  Co-morbid voice changes: No  FACTORS WHICH MAY INCREASE RISK OF ADVERSE EVENT IN PRESENCE OF ASPIRATION:  General health: well appearing  Risk factors: none evident     ORAL MOTOR EXAMINATION: Overall status: WFL Comments: Pt reports "jaw locking" (left sided ear discomfort as well)  CLINICAL SWALLOW ASSESSMENT:   Dentition: adequate natural dentition Vocal quality at baseline: normal Patient directly observed with POs: Yes: regular, dysphagia 3 (soft), dysphagia 1 (puree), and thin liquids  Feeding: able to feed self Liquids provided by: cup and straw [facility] Swallow Protocol: Pass Oral phase signs and symptoms: oral holding Pharyngeal phase signs and symptoms: wet vocal quality                                                                                                                         TREATMENT DATE: 01/22/24  PATIENT EDUCATION: Education details: Pt provided written recommendations for well masticated or cut solids, add moisture as needed, straw ok Person educated: Patient Education method: Explanation, Demonstration, and Handouts Education comprehension: verbalized understanding   ASSESSMENT:  CLINICAL IMPRESSION: Patient is an 88 y.o. male who was seen today for a clinical swallow evaluation and treatment. Pt had MBSS completed in 2021 and 2025 with similar results. The imaging from his 2025 study was reviewed in session with Pt. He was able to see the oral swallow delay (prolonged oral transit and hold) with liquids and then was cued to use a straw to see if this helped facilitate oral phase, which he reported improvement. He reports left sided jaw and ear pain and he was encouraged to share this with his doctor and encouraged to masticate or cut solids well and add moisture as needed for comfort of mastication. Oral motor examination was essentially WNL. He indicates that he often has to "throw his head back" to get ready to swallow liquids and use of straw appeared to eliminate this behavior. Muscle strength appears WNL and no need for pharyngeal strengthening exercises. Pt is able to consume all textures and consistencies with some delay. It is difficult to determine why his oral phase is prolonged, but could be impacted by TMJ discomfort, guarding behaviors, or even esophageal dysmotility or reflux.  He was also encouraged to try taking his medication whole in puree (yogurt, applesauce, or pudding etc) to facilitate pharyngeal clearance (was transiently delayed on his last esophagram). Additional dysphagia intervention is not indicated at this time, however Pt was given my contact information should additional needs arise. D/C from SLP services at this time.    OBJECTIVE IMPAIRMENTS: include dysphagia. These impairments are limiting patient from safety when swallowing. Factors affecting potential to achieve goals and functional outcome are previous level of function. Patient will benefit from skilled SLP services to address above impairments and improve overall function.  REHAB POTENTIAL: Good   GOALS: Goals reviewed with patient? Yes  SHORT TERM GOALS: Target date: 01/22/2024  Pt will implement compensatory strategies of small bites/small sips,  use of straw per preference for thins independently after introduced by SLP. Baseline: Provided initial cues  Goal status: MET   PLAN:  SLP FREQUENCY: one time visit  SLP DURATION: other: one time visit  PLANNED INTERVENTIONS: SLP instruction and feedback, Compensatory strategies, 92526 Treatment of swallowing function, and 16109 evaluation of swallowing   Thank you,  Claudetta Cuba, CCC-SLP [phone]  Quinta Eimer, CCC-SLP 01/22/2024, 3:51 PM

## 2024-03-10 DIAGNOSIS — R3912 Poor urinary stream: Secondary | ICD-10-CM | POA: Diagnosis not present

## 2024-03-10 DIAGNOSIS — N393 Stress incontinence (female) (male): Secondary | ICD-10-CM | POA: Diagnosis not present

## 2024-03-13 ENCOUNTER — Encounter: Payer: Self-pay | Admitting: Nurse Practitioner

## 2024-03-13 ENCOUNTER — Telehealth (INDEPENDENT_AMBULATORY_CARE_PROVIDER_SITE_OTHER): Payer: Medicare HMO | Admitting: Nurse Practitioner

## 2024-03-13 DIAGNOSIS — Z0000 Encounter for general adult medical examination without abnormal findings: Secondary | ICD-10-CM | POA: Diagnosis not present

## 2024-03-13 NOTE — Progress Notes (Signed)
 Subjective:   Kevin [name] is a 88 y.o. male who presents for Medicare Annual/Subsequent preventive examination.  Visit Complete: Virtual I connected with  Kevin [name] on 03/13/24 by a video and audio enabled telemedicine application and verified that I am speaking with the correct person using two identifiers.  Patient Location: Home  Provider Location: Office/Clinic  I discussed the limitations of evaluation and management by telemedicine. The patient expressed understanding and agreed to proceed.  Vital Signs: Because this visit was a virtual/telehealth visit, some criteria may be missing or patient reported. Any vitals not documented were not able to be obtained and vitals that have been documented are patient reported.    Cardiac Risk Factors include: sedentary lifestyle;advanced age (>59men, >50 women);male gender     Objective:    Today's Vitals   03/13/24 1006  PainSc: 5    There is no height or weight on file to calculate BMI.     03/13/2024    9:50 AM 01/22/2024    3:44 PM 12/31/2023    8:41 AM 10/08/2023    9:11 AM 08/20/2023    9:18 AM 08/15/2023    6:26 PM 07/02/2023    8:19 AM  Advanced Directives  Does Patient Have a Medical Advance Directive? Yes Yes Yes Yes Yes No Yes  Type of Advance Directive Living will Healthcare Power of [place];Living will Living will  Living will  Living will  Does patient want to make changes to medical advance directive? No - Patient declined  No - Patient declined  No - Patient declined  No - Patient declined  Copy of Healthcare Power of Attorney in Chart?  No - copy requested       Would patient like information on creating a medical advance directive?     No - Patient declined No - Patient declined     Current Medications (verified) Outpatient Encounter Medications as of 03/13/2024  Medication Sig   acetaminophen (TYLENOL) 325 MG tablet Take 650 mg by mouth every 6 (six) hours as needed.   calcium carbonate (TUMS  EX) 750 MG chewable tablet Chew 1 tablet by mouth as needed for heartburn.   diclofenac Sodium (VOLTAREN) 1 % GEL Apply 4 g topically 2 (two) times daily as needed. Apply to right knee and ankle   esomeprazole  (NEXIUM ) 40 MG capsule Take 1 capsule (40 mg total) by mouth 2 (two) times daily before a meal.   MAGNESIUM  PO Take 2 Doses by mouth at bedtime. 250 mg chewable total of 500 mg   Menthol, Topical Analgesic, (BIOFREEZE) 4 % GEL Apply topically daily as needed.   vitamin B-12 (CYANOCOBALAMIN ) 1000 MCG tablet Take 1,000 mcg by mouth daily.   meclizine  (ANTIVERT ) 25 MG tablet Take 1 tablet (25 mg total) by mouth 3 (three) times daily as needed for dizziness. (Patient not taking: Reported on 03/13/2024)   ondansetron  (ZOFRAN -ODT) 4 MG disintegrating tablet 4mg  ODT q4 hours prn nausea/vomit (Patient not taking: Reported on 03/13/2024)   predniSONE  (STERAPRED UNI-PAK 21 TAB) 10 MG (21) TBPK tablet Use as directed (Patient not taking: Reported on 03/13/2024)   No facility-administered encounter medications on file as of 03/13/2024.    Allergies (verified) Beef-derived drug products   History: Past Medical History:  Diagnosis Date   Anxiety disorder, unspecified    Arthritis    Cancer (HCC)    prostate   Cataract    Edema    Erectile dysfunction    Glaucoma    History  of blood in urine    History of cataract surgery 09/26/2015   Both Eyes by Dr.Hunt.   History of colonoscopy 09/26/2007   Dr.LeBaur   Indigestion    Knee pain    Pain in unspecified hip    Prostate cancer [facility])    Urinary incontinence    Past Surgical History:  Procedure Laterality Date   CATARACT EXTRACTION W/PHACO Right 01/20/2016   Procedure: CATARACT EXTRACTION PHACO AND INTRAOCULAR LENS PLACEMENT (IOC);  Surgeon: Anner Kill, MD;  Location: [facility];  Service: Ophthalmology;  Laterality: Right;  CDE 15.42   CATARACT EXTRACTION W/PHACO Left 03/06/2016   Procedure: CATARACT EXTRACTION PHACO AND INTRAOCULAR LENS  PLACEMENT LEFT EYE;  CDE:  15.81;  Surgeon: Anner Kill, MD;  Location: [facility];  Service: Ophthalmology;  Laterality: Left;   CHOLECYSTECTOMY  2011   COLONOSCOPY  07/03/2008   FRACTURE SURGERY  09/25/1966   [facility].    GALLBLADDER SURGERY  09/26/2007   Dr.Jenkins    HERNIA REPAIR Right 09/26/1991   Dr.Smith    PROSTATE SURGERY     cancer   SUPRAPUBIC PROSTATECTOMY  09/26/1995   Dr.Wrenn   VASECTOMY  09/25/1972   [facility].    Family History  Problem Relation Age of Onset   Healthy Mother    Hypertension Mother    Dementia Mother    Heart disease Father    Dementia Father    Heart failure Father    Heart disease Brother    Stroke Brother    Hypertension Son    Stroke Brother    Social History   Socioeconomic History   Marital status: Married    Spouse name: Not on file   Number of children: 3   Years of education: Not on file   Highest education level: GED or equivalent  Occupational History   Occupation: retired  Tobacco Use   Smoking status: Former    Current packs/day: 0.00    Average packs/day: 0.5 packs/day for 16.0 years (8.0 ttl pk-yrs)    Types: Cigarettes, Cigars    Start date: 01/18/1988    Quit date: 01/18/2004    Years since quitting: 20.1   Smokeless tobacco: Never  Vaping Use   Vaping status: Never Used  Substance and Sexual Activity   Alcohol use: Yes    Alcohol/week: 1.0 standard drink of alcohol    Types: 1 Cans of beer per week    Comment: 1 beer week   Drug use: No   Sexual activity: Not Currently    Birth control/protection: None  Other Topics Concern   Not on file  Social History Narrative   Tobacco use, amount per day now: Non smoker for 16 years.   Past tobacco use, amount per day: 1/2 Pack of cigarettes a day or 3 cigars.    How many years did you use tobacco: 15 years.   Alcohol use (drinks per week): 0-2 drinks.   Diet: Normal.    Do you drink/eat things with caffeine: Yes.   Marital status:  Married                                 What year were you married? 1981   Do you live in a house, apartment, assisted living, condo, trailer, etc.? House.   Is it one or more stories? 2 stories.    How many persons live in your home? 2  Do you have pets in your home?( please list)  Yes 2 dogs, 1 cat.   Highest Level of education completed: High School GED.   Current or past profession: Estate manager/land agent.    Do you exercise? Not regularly.                                    Type and how often? Working outside on yard.    Do you have a living will? Yes.   Do you have a DNR form?  Yes.                                 If not, do you want to discuss one?   Do you have signed POA/HPOA forms? Yes.                       If so, please bring to you appointment    Do you have any difficulty bathing or dressing yourself? No.    Do you have difficulty preparing food or eating? No.   Do you have difficulty managing your medications? No.   Do you have any difficulty managing your finances? No.    Do you have any difficulty affording your medications? No.       Social Drivers of Health   Financial Resource Strain: Medium Risk (12/27/2023)   Overall Financial Resource Strain (CARDIA)    Difficulty of Paying Living Expenses: Somewhat hard  Food Insecurity: No Food Insecurity (03/13/2024)   Hunger Vital Sign    Worried About Running Out of Food in the Last Year: Never true    Ran Out of Food in the Last Year: Never true  Transportation Needs: No Transportation Needs (03/13/2024)   PRAPARE - Administrator, Civil Service (Medical): No    Lack of Transportation (Non-Medical): No  Physical Activity: Insufficiently Active (12/27/2023)   Exercise Vital Sign    Days of Exercise per Week: 1 day    Minutes of Exercise per Session: 20 min  Stress: Stress Concern Present (12/27/2023)   [employer] of Occupational Health - Occupational Stress Questionnaire    Feeling of Stress : To some extent   Social Connections: Socially Integrated (12/27/2023)   Social Connection and Isolation Panel    Frequency of Communication with Friends and Family: Twice a week    Frequency of Social Gatherings with Friends and Family: Twice a week    Attends Religious Services: More than 4 times per year    Active Member of [employer] or Organizations: Yes    Attends Engineer, structural: More than 4 times per year    Marital Status: Married    Tobacco Counseling Counseling given: Not Answered   Clinical Intake:  Pre-visit preparation completed: Yes  Pain : 0-10 Pain Score: 5  Pain Type: Chronic pain Pain Location: Hand Pain Descriptors / Indicators: Aching Pain Onset: More than a month ago Pain Frequency: Intermittent     BMI - recorded: 28 Nutritional Status: BMI 25 -29 Overweight Nutritional Risks: None  How often do you need to have someone help you when you read instructions, pamphlets, or other written materials from your doctor or pharmacy?: 5 - Always         Activities of Daily Living    03/13/2024    9:47 AM  In your present state of health, do you have any difficulty performing the following activities:  Hearing? 1  Comment Difficulty hearing noise in left ear  Vision? 0  Difficulty concentrating or making decisions? 1  Walking or climbing stairs? 1  Dressing or bathing? 0  Doing errands, shopping? 0  Preparing Food and eating ? N  Using the Toilet? N  In the past six months, have you accidently leaked urine? Y  Do you have problems with loss of bowel control? Y  Managing your Medications? N  Managing your Finances? N  Housekeeping or managing your Housekeeping? N    Patient Care Team: Verma Gobble, NP as PCP - General (Geriatric Medicine) Loyde Rule, MD as Consulting Physician (Cardiology) Homero Luster, MD as Attending Physician (Urology)  Indicate any recent Medical Services you may have received from other than [facility] providers in the past  year (date may be approximate).     Assessment:   This is a routine wellness examination for Yamil.  Hearing/Vision screen Hearing Screening - Comments:: Some hearing issues in left ear. Vision Screening - Comments:: Eye exam July 2024 new glasses My eye doctor in [place]   Goals Addressed   None    Depression Screen    03/13/2024    9:52 AM 12/31/2023    8:40 AM 08/20/2023    9:18 AM 07/02/2023    8:19 AM 03/06/2023   10:43 AM 01/01/2023    8:15 AM 03/02/2022   10:48 AM  PHQ 2/9 Scores  PHQ - 2 Score 0 0 0 0 0 0 0  PHQ- 9 Score      0 0    Fall Risk    03/13/2024    9:52 AM 10/08/2023    9:11 AM 08/20/2023    9:18 AM 07/02/2023    8:19 AM 04/04/2023    9:59 AM  Fall Risk   Falls in the past year? 0 0 0 0 0  Number falls in past yr: 0 0 0 0 0  Injury with Fall? 0 0 0 0 0  Risk for fall due to : No Fall Risks   No Fall Risks   Follow up Falls evaluation completed Falls evaluation completed  Falls evaluation completed Falls evaluation completed    MEDICARE RISK AT HOME:    TIMED UP AND GO:  Was the test performed?  No    Cognitive Function:      10/08/2023    9:00 AM 11/24/2022   11:00 AM  Montreal Cognitive Assessment   Visuospatial/ Executive (0/5) 4 4  Naming (0/3) 2 1  Attention: Read list of digits (0/2) 2 2  Attention: Read list of letters (0/1) 1 0  Attention: Serial 7 subtraction starting at 100 (0/3) 2 3  Language: Repeat phrase (0/2) 2 2  Language : Fluency (0/1) 0 0  Abstraction (0/2) 1 0  Delayed Recall (0/5) 0 0  Orientation (0/6) 4 4  Total 18 16  Adjusted Score (based on education) 19 17      03/13/2024    9:52 AM 03/06/2023   10:45 AM 03/02/2022   10:48 AM 02/28/2021    9:59 AM 02/25/2020   10:09 AM  6CIT Screen  What Year? 0 points 0 points 0 points 0 points 0 points  What month? 0 points 0 points 0 points 0 points 0 points  What time? 0 points 0 points 0 points 0 points 0 points  Count back from 20 0 points 0  points 0 points 0 points 0  points  Months in reverse 0 points 0 points 0 points 0 points 0 points  Repeat phrase 4 points 4 points 2 points 8 points 0 points  Total Score 4 points 4 points 2 points 8 points 0 points    Immunizations Immunization History  Administered Date(s) Administered   Fluad Quad(high Dose 65+) 07/03/2022   Fluad Trivalent(High Dose 65+) 07/02/2023   Influenza Split 07/06/2016, 07/03/2017, 05/27/2019   Influenza, High Dose Seasonal PF 07/20/2020   Influenza-Unspecified 09/25/2018   Moderna Covid-19 Vaccine Bivalent Booster 58yrs & up 11/05/2021   Moderna SARS-COV2 Booster Vaccination 07/22/2020, 05/09/2021   Moderna Sars-Covid-2 Vaccination 10/31/2019, 11/29/2019   PNEUMOCOCCAL CONJUGATE-20 01/01/2023   Pneumococcal Polysaccharide-23 01/02/2017   Zoster Recombinant(Shingrix) 03/21/2021, 12/21/2021    TDAP status: Due, Education has been provided regarding the importance of this vaccine. Advised may receive this vaccine at local pharmacy or Health Dept. Aware to provide a copy of the vaccination record if obtained from local pharmacy or Health Dept. Verbalized acceptance and understanding.  Flu Vaccine status: Up to date  Pneumococcal vaccine status: Up to date  Covid-19 vaccine status: Information provided on how to obtain vaccines.   Qualifies for Shingles Vaccine? Yes   Zostavax completed No   Shingrix Completed?: Yes  Screening Tests Health Maintenance  Topic Date Due   DTaP/Tdap/Td (1 - Tdap) Never done   COVID-19 Vaccine (6 - 2024-25 season) 05/27/2023   INFLUENZA VACCINE  04/25/2024   Medicare Annual Wellness (AWV)  03/13/2025   Pneumococcal Vaccine: 50+ Years  Completed   Zoster Vaccines- Shingrix  Completed   HPV VACCINES  Aged Out   Meningococcal B Vaccine  Aged Out    Health Maintenance  Health Maintenance Due  Topic Date Due   DTaP/Tdap/Td (1 - Tdap) Never done   COVID-19 Vaccine (6 - 2024-25 season) 05/27/2023    Colorectal cancer screening: No longer  required.   Lung Cancer Screening: (Low Dose CT Chest recommended if Age 66-80 years, 20 pack-year currently smoking OR have quit w/in 15years.) does not qualify.   Lung Cancer Screening Referral: na  Additional Screening:  Hepatitis C Screening: does not qualify  Vision Screening: Recommended annual ophthalmology exams for early detection of glaucoma and other disorders of the eye. Is the patient up to date with their annual eye exam?  Yes  Who is the provider or what is the name of the office in which the patient attends annual eye exams? My eye doctor If pt is not established with a provider, would they like to be referred to a provider to establish care? No .   Dental Screening: Recommended annual dental exams for proper oral hygiene   Community Resource Referral / Chronic Care Management: CRR required this visit?  No   CCM required this visit?  No     Plan:     I have personally reviewed and noted the following in the patient's chart:   Medical and social history Use of alcohol, tobacco or illicit drugs  Current medications and supplements including opioid prescriptions. Patient is not currently taking opioid prescriptions. Functional ability and status Nutritional status Physical activity Advanced directives List of other physicians Hospitalizations, surgeries, and ER visits in previous 12 months Vitals Screenings to include cognitive, depression, and falls Referrals and appointments  In addition, I have reviewed and discussed with patient certain preventive protocols, quality metrics, and best practice recommendations. A written personalized care plan for preventive services as well  as general preventive health recommendations were provided to patient.     Verma Gobble, NP   03/13/2024   After Visit Summary: (MyChart) Due to this being a telephonic visit, the after visit summary with patients personalized plan was offered to patient via MyChart

## 2024-03-13 NOTE — Progress Notes (Signed)
   This service is provided via telemedicine  No vital signs collected/recorded due to the encounter was a telemedicine visit.   Location of patient (ex: home, work):  Home  Patient consents to a telephone visit: Yes  Location of the provider (ex: office, home):  [facility], Office   Name of any referring provider:  N/A  Names of all persons participating in the telemedicine service and their role in the encounter:  Ronald Pippins, CMA, Patient, and   Time spent on call:  9 min with medical assistant  Janyth Contes Janene Harvey, NP

## 2024-05-18 ENCOUNTER — Other Ambulatory Visit: Payer: Self-pay | Admitting: Internal Medicine

## 2024-05-18 DIAGNOSIS — R1310 Dysphagia, unspecified: Secondary | ICD-10-CM

## 2024-05-18 DIAGNOSIS — K219 Gastro-esophageal reflux disease without esophagitis: Secondary | ICD-10-CM

## 2024-06-04 ENCOUNTER — Other Ambulatory Visit: Payer: Self-pay | Admitting: Nurse Practitioner

## 2024-06-04 DIAGNOSIS — M7062 Trochanteric bursitis, left hip: Secondary | ICD-10-CM

## 2024-06-04 DIAGNOSIS — G8929 Other chronic pain: Secondary | ICD-10-CM

## 2024-06-04 NOTE — Telephone Encounter (Signed)
 Copied from CRM [id number]. Topic: Clinical - Medication Refill >> Jun 04, 2024 11:46 AM Vivian Z wrote: Medication: predniSONE  (STERAPRED UNI-PAK 21 TAB) 10 MG (21) TBPK tablet  Has the patient contacted their pharmacy? Yes (Agent: If no, request that the patient contact the pharmacy for the refill. If patient does not wish to contact the pharmacy document the reason why and proceed with request.) (Agent: If yes, when and what did the pharmacy advise?)  This is the patient's preferred pharmacy:  [facility] [place], [place] - [id number] [place] [address] Phone: [phone] Fax: [phone]  Is this the correct pharmacy for this prescription? Yes If no, delete pharmacy and type the correct one.   Has the prescription been filled recently? No  Is the patient out of the medication? Yes  Has the patient been seen for an appointment in the last year OR does the patient have an upcoming appointment? Yes  Can we respond through MyChart? No  Agent: Please be advised that Rx refills may take up to 3 business days. We ask that you follow-up with your pharmacy.

## 2024-06-04 NOTE — Telephone Encounter (Signed)
 FYI Only or Action Required?: Action required by provider: medication refill request.  Patient was last seen in primary care on 03/13/2024 by Caro Harlene POUR, NP.  Called Nurse Triage reporting No chief complaint on file..  Symptoms began today.  Interventions attempted: Nothing.  Symptoms are: stable.  Triage Disposition: No disposition on file.  Patient/caregiver understands and will follow disposition?:

## 2024-07-11 ENCOUNTER — Encounter: Payer: Self-pay | Admitting: Nurse Practitioner

## 2024-07-11 ENCOUNTER — Ambulatory Visit: Admitting: Nurse Practitioner

## 2024-07-11 VITALS — BP 118/78 | HR 57 | Temp 97.6°F | Ht 68.0 in | Wt 181.4 lb

## 2024-07-11 DIAGNOSIS — R1310 Dysphagia, unspecified: Secondary | ICD-10-CM | POA: Diagnosis not present

## 2024-07-11 DIAGNOSIS — Z8546 Personal history of malignant neoplasm of prostate: Secondary | ICD-10-CM | POA: Diagnosis not present

## 2024-07-11 DIAGNOSIS — G3184 Mild cognitive impairment, so stated: Secondary | ICD-10-CM | POA: Diagnosis not present

## 2024-07-11 DIAGNOSIS — K219 Gastro-esophageal reflux disease without esophagitis: Secondary | ICD-10-CM | POA: Diagnosis not present

## 2024-07-11 DIAGNOSIS — Z23 Encounter for immunization: Secondary | ICD-10-CM | POA: Diagnosis not present

## 2024-07-11 DIAGNOSIS — M150 Primary generalized (osteo)arthritis: Secondary | ICD-10-CM

## 2024-07-11 DIAGNOSIS — I10 Essential (primary) hypertension: Secondary | ICD-10-CM

## 2024-07-11 MED ORDER — MEMANTINE HCL 28 X 5 MG & 21 X 10 MG PO TABS: ORAL_TABLET | ORAL | 12 refills | Status: AC

## 2024-07-11 MED ORDER — MEMANTINE HCL 10 MG PO TABS: 5.0000 mg | ORAL_TABLET | Freq: Two times a day (BID) | ORAL | 1 refills | Status: AC

## 2024-07-11 NOTE — Progress Notes (Unsigned)
 Careteam: Patient Care Team: Caro Harlene POUR, NP as PCP - General (Geriatric Medicine) Delford Maude BROCKS, MD as Consulting Physician (Cardiology) Watt Rush, MD as Attending Physician (Urology)  PLACE OF SERVICE:  [facility]  Advanced Directive information    Allergies  Allergen Reactions   Bovine (Beef) Protein-Containing Drug Products Nausea And Vomiting    Chief Complaint  Patient presents with   Medical Management of Chronic Issues    Routine 6 month visit  Pt right knee is getting worse Flu shot given in left deltoid     HPI:  Discussed the use of AI scribe software for clinical note transcription with the patient, who gave verbal consent to proceed.  History of Present Illness Kevin [name] is an 88 year old male with knee arthritis who presents for a six-month follow-up.  He experiences worsening knee pain due to arthritis. He uses a knee bandage and takes Tylenol twice daily for pain management, along with Voltaren gel. Instability occurs when the bandage is removed, particularly at night, leading to near falls when going to the bathroom.  He has a history of prostate cancer and is followed by a urologist.  He is experiencing memory issues and is followed by a neurologist. He has a quicker onset of forgetfulness, needing to retrace his steps to remember tasks. He previously tried Aricept  but stopped due to sleep interference. Gabapentin  was not well tolerated.  He takes Nexium  for indigestion and acid reflux, he finds it difficult to swallow Tylenol and Nexium  tablets, often breaking them into pieces. He has tried applesauce and pudding to aid swallowing. Has been followed by GI  He has a history of high blood pressure but is not currently on medication as his blood pressure is stable.  He maintains a stable weight and diet, enjoying a morning cookie with coffee and incorporating more fish into his meals. No significant weight loss and he reports eating  well.   Review of Systems:  Review of Systems  Constitutional:  Negative for chills, fever and weight loss.  HENT:  Negative for tinnitus.   Respiratory:  Negative for cough, sputum production and shortness of breath.   Cardiovascular:  Negative for chest pain, palpitations and leg swelling.  Gastrointestinal:  Negative for abdominal pain, constipation, diarrhea and heartburn.  Genitourinary:  Negative for dysuria, frequency and urgency.  Musculoskeletal:  Positive for joint pain. Negative for back pain, falls and myalgias.  Skin: Negative.   Neurological:  Negative for dizziness and headaches.  Psychiatric/Behavioral:  Positive for memory loss. Negative for depression. The patient does not have insomnia.     Past Medical History:  Diagnosis Date   Anxiety disorder, unspecified    Arthritis    Cancer (HCC)    prostate   Cataract    Edema    Erectile dysfunction    Glaucoma    History of blood in urine    History of cataract surgery 09/26/2015   Both Eyes by Dr.Hunt.   History of colonoscopy 09/26/2007   Dr.LeBaur   Indigestion    Knee pain    Pain in unspecified hip    Prostate cancer [facility])    Urinary incontinence    Past Surgical History:  Procedure Laterality Date   CATARACT EXTRACTION W/PHACO Right 01/20/2016   Procedure: CATARACT EXTRACTION PHACO AND INTRAOCULAR LENS PLACEMENT (IOC);  Surgeon: Cherene Mania, MD;  Location: [facility];  Service: Ophthalmology;  Laterality: Right;  CDE 15.42   CATARACT EXTRACTION W/PHACO  Left 03/06/2016   Procedure: CATARACT EXTRACTION PHACO AND INTRAOCULAR LENS PLACEMENT LEFT EYE;  CDE:  15.81;  Surgeon: Cherene Mania, MD;  Location: [facility];  Service: Ophthalmology;  Laterality: Left;   CHOLECYSTECTOMY  2011   COLONOSCOPY  07/03/2008   FRACTURE SURGERY  09/25/1966   [facility].    GALLBLADDER SURGERY  09/26/2007   Dr.Jenkins    HERNIA REPAIR Right 09/26/1991   Dr.Smith    PROSTATE SURGERY     cancer   SUPRAPUBIC PROSTATECTOMY   09/26/1995   Dr.Wrenn   VASECTOMY  09/25/1972   [facility].    Social History:   reports that he quit smoking about 20 years ago. His smoking use included cigarettes and cigars. He started smoking about 36 years ago. He has a 8 pack-year smoking history. He has never used smokeless tobacco. He reports current alcohol use of about 1.0 standard drink of alcohol per week. He reports that he does not use drugs.  Family History  Problem Relation Age of Onset   Healthy Mother    Hypertension Mother    Dementia Mother    Heart disease Father    Dementia Father    Heart failure Father    Heart disease Brother    Stroke Brother    Hypertension Son    Stroke Brother     Medications: Patient's Medications  New Prescriptions   MEMANTINE (NAMENDA TITRATION PAK) TABLET PACK    5 mg/day for =1 week; 5 mg twice daily for =1 week; 15 mg/day given in 5 mg and 10 mg separated doses for =1 week; then 10 mg twice daily   MEMANTINE (NAMENDA) 10 MG TABLET    Take 0.5 tablets (5 mg total) by mouth 2 (two) times daily.  Previous Medications   ACETAMINOPHEN (TYLENOL) 325 MG TABLET    Take 650 mg by mouth every 6 (six) hours as needed.   CALCIUM CARBONATE (TUMS EX) 750 MG CHEWABLE TABLET    Chew 1 tablet by mouth as needed for heartburn.   DICLOFENAC SODIUM (VOLTAREN) 1 % GEL    Apply 4 g topically 2 (two) times daily as needed. Apply to right knee and ankle   ESOMEPRAZOLE  (NEXIUM ) 40 MG CAPSULE    Take 1 capsule (40 mg total) by mouth 2 (two) times daily before a meal.   MAGNESIUM  PO    Take 2 Doses by mouth at bedtime. 250 mg chewable total of 500 mg   MENTHOL, TOPICAL ANALGESIC, (BIOFREEZE) 4 % GEL    Apply topically daily as needed.   VITAMIN B-12 (CYANOCOBALAMIN ) 1000 MCG TABLET    Take 1,000 mcg by mouth daily.  Modified Medications   No medications on file  Discontinued Medications   MECLIZINE  (ANTIVERT ) 25 MG TABLET    Take 1 tablet (25 mg total) by mouth 3 (three) times daily as needed  for dizziness.   ONDANSETRON  (ZOFRAN -ODT) 4 MG DISINTEGRATING TABLET    4mg  ODT q4 hours prn nausea/vomit   PREDNISONE  (STERAPRED UNI-PAK 21 TAB) 10 MG (21) TBPK TABLET    Use as directed    Physical Exam:  Vitals:   07/11/24 1117  BP: 118/78  Pulse: (!) 57  Temp: 97.6 F (36.4 C)  SpO2: 98%  Weight: 181 lb 6.4 oz (82.3 kg)  Height: 5' 8 (1.727 m)   Body mass index is 27.58 kg/m. Wt Readings from Last 3 Encounters:  07/11/24 181 lb 6.4 oz (82.3 kg)  12/31/23 185 lb 9.6 oz (  84.2 kg)  10/08/23 182 lb (82.6 kg)    Physical Exam Constitutional:      General: He is not in acute distress.    Appearance: He is well-developed. He is not diaphoretic.  HENT:     Head: Normocephalic and atraumatic.     Right Ear: External ear normal.     Left Ear: External ear normal.     Mouth/Throat:     Pharynx: No oropharyngeal exudate.  Eyes:     Conjunctiva/sclera: Conjunctivae normal.     Pupils: Pupils are equal, round, and reactive to light.  Cardiovascular:     Rate and Rhythm: Normal rate and regular rhythm.     Heart sounds: Normal heart sounds.  Pulmonary:     Effort: Pulmonary effort is normal.     Breath sounds: Normal breath sounds.  Abdominal:     General: Bowel sounds are normal.     Palpations: Abdomen is soft.  Musculoskeletal:        General: No tenderness.     Cervical back: Normal range of motion and neck supple.     Right lower leg: No edema.     Left lower leg: No edema.  Skin:    General: Skin is warm and dry.  Neurological:     Mental Status: He is alert and oriented to person, place, and time.     Labs reviewed: Basic Metabolic Panel: Recent Labs    08/15/23 1931 12/31/23 0912  NA 136 140  K 3.9 4.7  CL 106 104  CO2 23 27  GLUCOSE 101* 92  BUN 15 17  CREATININE 1.20 1.29*  CALCIUM 8.6* 9.7   Liver Function Tests: Recent Labs    12/31/23 0912  AST 18  ALT 16  BILITOT 1.0  PROT 6.7   No results for input(s): LIPASE, AMYLASE in the  last 8760 hours. No results for input(s): AMMONIA in the last 8760 hours. CBC: Recent Labs    08/15/23 1931 12/31/23 0912  WBC 8.5 6.2  NEUTROABS  --  3,646  HGB 15.5 15.7  HCT 45.4 46.2  MCV 88.2 89.0  PLT 177 199   Lipid Panel: No results for input(s): CHOL, HDL, LDLCALC, TRIG, CHOLHDL, LDLDIRECT in the last 8760 hours. TSH: No results for input(s): TSH in the last 8760 hours. A1C: No results found for: HGBA1C   Assessment/Plan  Primary osteoarthritis involving multiple joints Assessment & Plan: Worsening with significant pain and instability to bilateral knees - Refer to orthopedic care for possible injection therapy. - Continue Tylenol twice daily. - Continue Voltaren gel.  Orders: -     Ambulatory referral to Orthopedics -     CBC with Differential/Platelet -     Comprehensive metabolic panel with GFR  Flu vaccine need -     Flu vaccine HIGH DOSE PF(Fluzone Trivalent)  Gastroesophageal reflux disease without esophagitis Assessment & Plan: Stable on nexium , continue with dietary modifications.    Mild neurocognitive disorder Assessment & Plan: Progressive memory decline. Aricept  discontinued due to sleep interference. - Start Namenda with titration plan. - Monitor for dizziness or other side effects. - Prescribe Namenda 10 mg twice daily after titration.  Orders: -     Memantine HCl; 5 mg/day for =1 week; 5 mg twice daily for =1 week; 15 mg/day given in 5 mg and 10 mg separated doses for =1 week; then 10 mg twice daily  Dispense: 49 tablet; Refill: 12 -     Memantine HCl; Take 0.5 tablets (  5 mg total) by mouth 2 (two) times daily.  Dispense: 30 tablet; Refill: 1 -     CBC with Differential/Platelet -     Comprehensive metabolic panel with GFR  Dysphagia, unspecified type Assessment & Plan: Completed GI workup. Continue lifestyle modifications   Essential hypertension Assessment & Plan: Diet controlled.   Orders: -     CBC with  Differential/Platelet -     Comprehensive metabolic panel with GFR  History of prostate cancer Assessment & Plan: Followed by urologist.     Return in about 6 months (around 01/09/2025) for routine follow up.  Talon Regala K. Caro BODILY [facility] [phone]

## 2024-07-12 LAB — COMPREHENSIVE METABOLIC PANEL WITH GFR
AG Ratio: 1.9 (calc) (ref 1.0–2.5)
ALT: 21 U/L (ref 9–46)
AST: 19 U/L (ref 10–35)
Albumin: 4.4 g/dL (ref 3.6–5.1)
Alkaline phosphatase (APISO): 54 U/L (ref 35–144)
BUN: 13 mg/dL (ref 7–25)
CO2: 30 mmol/L (ref 20–32)
Calcium: 9.4 mg/dL (ref 8.6–10.3)
Chloride: 104 mmol/L (ref 98–110)
Creat: 1.15 mg/dL (ref 0.70–1.22)
Globulin: 2.3 g/dL (ref 1.9–3.7)
Glucose, Bld: 96 mg/dL (ref 65–99)
Potassium: 4.7 mmol/L (ref 3.5–5.3)
Sodium: 141 mmol/L (ref 135–146)
Total Bilirubin: 0.9 mg/dL (ref 0.2–1.2)
Total Protein: 6.7 g/dL (ref 6.1–8.1)
eGFR: 62 mL/min/1.73m2 (ref >=60)

## 2024-07-12 LAB — CBC WITH DIFFERENTIAL/PLATELET
Absolute Lymphocytes: 1993 {cells}/uL (ref 850–3900)
Absolute Monocytes: 561 {cells}/uL (ref 200–950)
Basophils Absolute: 53 {cells}/uL (ref 0–200)
Basophils Relative: 0.8 %
Eosinophils Absolute: 53 {cells}/uL (ref 15–500)
Eosinophils Relative: 0.8 %
HCT: 46.5 % (ref 38.5–50.0)
Hemoglobin: 15.9 g/dL (ref 13.2–17.1)
MCH: 31.2 pg (ref 27.0–33.0)
MCHC: 34.2 g/dL (ref 32.0–36.0)
MCV: 91.2 fL (ref 80.0–100.0)
MPV: 10.1 fL (ref 7.5–12.5)
Monocytes Relative: 8.5 %
Neutro Abs: 3940 {cells}/uL (ref 1500–7800)
Neutrophils Relative %: 59.7 %
Platelets: 183 Thousand/uL (ref 140–400)
RBC: 5.1 Million/uL (ref 4.20–5.80)
RDW: 12.4 % (ref 11.0–15.0)
Total Lymphocyte: 30.2 %
WBC: 6.6 Thousand/uL (ref 3.8–10.8)

## 2024-07-13 ENCOUNTER — Ambulatory Visit: Payer: Self-pay | Admitting: Nurse Practitioner

## 2024-07-14 ENCOUNTER — Ambulatory Visit
Admission: RE | Admit: 2024-07-14 | Discharge: 2024-07-14 | Disposition: A | Discharge status: Home / Self Care | Source: Ambulatory Visit | Attending: Student | Admitting: Student

## 2024-07-14 VITALS — BP 146/78 | HR 65 | Temp 97.4°F | Resp 18

## 2024-07-14 DIAGNOSIS — B349 Viral infection, unspecified: Secondary | ICD-10-CM | POA: Insufficient documentation

## 2024-07-14 DIAGNOSIS — G3184 Mild cognitive impairment, so stated: Secondary | ICD-10-CM | POA: Insufficient documentation

## 2024-07-14 DIAGNOSIS — M150 Primary generalized (osteo)arthritis: Secondary | ICD-10-CM | POA: Insufficient documentation

## 2024-07-14 DIAGNOSIS — I10 Essential (primary) hypertension: Secondary | ICD-10-CM | POA: Insufficient documentation

## 2024-07-14 DIAGNOSIS — K219 Gastro-esophageal reflux disease without esophagitis: Secondary | ICD-10-CM | POA: Insufficient documentation

## 2024-07-14 LAB — POCT RAPID STREP A (OFFICE): Rapid Strep A Screen: NEGATIVE

## 2024-07-14 LAB — POC SOFIA SARS ANTIGEN FIA: SARS Coronavirus 2 Ag: NEGATIVE

## 2024-07-14 MED ORDER — OXYMETAZOLINE HCL 0.05 % NA SOLN: 1.0000 | Freq: Two times a day (BID) | NASAL | 0 refills | Status: AC

## 2024-07-14 MED ORDER — CETIRIZINE HCL 10 MG PO TABS
10.0000 mg | ORAL_TABLET | Freq: Every day | ORAL | 2 refills | Status: AC
Start: 2024-07-14 — End: ?

## 2024-07-14 NOTE — Assessment & Plan Note (Signed)
 Worsening with significant pain and instability to bilateral knees - Refer to orthopedic care for possible injection therapy. - Continue Tylenol twice daily. - Continue Voltaren gel.

## 2024-07-14 NOTE — ED Provider Notes (Signed)
 [facility]    CSN: [id number] Arrival date & time: 07/14/24  1415      History   Chief Complaint Chief Complaint  Patient presents with   Sore Throat    Entered by patient    HPI Kevin [name] is a 88 y.o. male presenting with sore throat and nasal congestion for 1 day.  Medical history prostate cancer, osteoarthritis, GERD, mild neurocognitive disorder, dysphagia.  Here today with wife.  Notes sore throat, postnasal drip, nasal congestion for 1 day.  Does also have allergic rhinitis, which is currently untreated.  The postnasal drip is worse at night, and was very bothersome last night until he gargled some Listerine.  Denies cough, shortness of breath, chest pain, fevers.  Denies known sick contact.  HPI  Past Medical History:  Diagnosis Date   Anxiety disorder, unspecified    Arthritis    Cancer (HCC)    prostate   Cataract    Edema    Erectile dysfunction    Glaucoma    History of blood in urine    History of cataract surgery 09/26/2015   Both Eyes by Dr.Hunt.   History of colonoscopy 09/26/2007   Dr.LeBaur   Indigestion    Knee pain    Pain in unspecified hip    Prostate cancer [facility])    Urinary incontinence     Patient Active Problem List   Diagnosis Date Noted   Primary osteoarthritis involving multiple joints 07/14/2024   Gastroesophageal reflux disease without esophagitis 07/14/2024   Mild neurocognitive disorder 07/14/2024   Essential hypertension 07/14/2024   Left-sided tinnitus 10/08/2023   Sensorineural hearing loss, bilateral 10/08/2023   History of prostate cancer 10/20/2020   Dysphagia 09/04/2019   Anxiety disorder, unspecified     Past Surgical History:  Procedure Laterality Date   CATARACT EXTRACTION W/PHACO Right 01/20/2016   Procedure: CATARACT EXTRACTION PHACO AND INTRAOCULAR LENS PLACEMENT (IOC);  Surgeon: Cherene Mania, MD;  Location: [facility];  Service: Ophthalmology;  Laterality: Right;  CDE 15.42   CATARACT EXTRACTION  W/PHACO Left 03/06/2016   Procedure: CATARACT EXTRACTION PHACO AND INTRAOCULAR LENS PLACEMENT LEFT EYE;  CDE:  15.81;  Surgeon: Cherene Mania, MD;  Location: [facility];  Service: Ophthalmology;  Laterality: Left;   CHOLECYSTECTOMY  2011   COLONOSCOPY  07/03/2008   FRACTURE SURGERY  09/25/1966   [facility].    GALLBLADDER SURGERY  09/26/2007   Dr.Jenkins    HERNIA REPAIR Right 09/26/1991   Dr.Smith    PROSTATE SURGERY     cancer   SUPRAPUBIC PROSTATECTOMY  09/26/1995   Dr.Wrenn   VASECTOMY  09/25/1972   [facility].        Home Medications    Prior to Admission medications   Medication Sig Start Date End Date Taking? Authorizing Provider  cetirizine (ZYRTEC ALLERGY) 10 MG tablet Take 1 tablet (10 mg total) by mouth daily. 07/14/24  Yes Leland Staszewski E, PA-C  oxymetazoline (AFRIN NASAL SPRAY) 0.05 % nasal spray Place 1 spray into both nostrils 2 (two) times daily. 07/14/24  Yes Dell Briner E, PA-C  acetaminophen (TYLENOL) 325 MG tablet Take 650 mg by mouth every 6 (six) hours as needed.    [provider]  calcium carbonate (TUMS EX) 750 MG chewable tablet Chew 1 tablet by mouth as needed for heartburn.    [provider]  diclofenac Sodium (VOLTAREN) 1 % GEL Apply 4 g topically 2 (two) times daily as needed. Apply to right  knee and ankle    [provider]  esomeprazole  (NEXIUM ) 40 MG capsule Take 1 capsule (40 mg total) by mouth 2 (two) times daily before a meal. 05/19/24   Avram Lupita BRAVO, MD  MAGNESIUM  PO Take 2 Doses by mouth at bedtime. 250 mg chewable total of 500 mg    [provider]  memantine (NAMENDA TITRATION PAK) tablet pack 5 mg/day for =1 week; 5 mg twice daily for =1 week; 15 mg/day given in 5 mg and 10 mg separated doses for =1 week; then 10 mg twice daily 07/11/24   Eubanks, Jessica K, NP  memantine (NAMENDA) 10 MG tablet Take 0.5 tablets (5 mg total) by mouth 2 (two) times daily. 08/11/24   Eubanks, Jessica K, NP   Menthol, Topical Analgesic, (BIOFREEZE) 4 % GEL Apply topically daily as needed.    [provider]  vitamin B-12 (CYANOCOBALAMIN ) 1000 MCG tablet Take 1,000 mcg by mouth daily.    [provider]    Family History Family History  Problem Relation Age of Onset   Healthy Mother    Hypertension Mother    Dementia Mother    Heart disease Father    Dementia Father    Heart failure Father    Heart disease Brother    Stroke Brother    Hypertension Son    Stroke Brother     Social History Social History   Tobacco Use   Smoking status: Former    Current packs/day: 0.00    Average packs/day: 0.5 packs/day for 16.0 years (8.0 ttl pk-yrs)    Types: Cigarettes, Cigars    Start date: 01/18/1988    Quit date: 01/18/2004    Years since quitting: 20.5   Smokeless tobacco: Never  Vaping Use   Vaping status: Never Used  Substance Use Topics   Alcohol use: Yes    Alcohol/week: 1.0 standard drink of alcohol    Types: 1 Cans of beer per week    Comment: 1 beer week   Drug use: No     Allergies   Bovine (beef) protein-containing drug products   Review of Systems Review of Systems  Constitutional:  Negative for appetite change, chills and fever.  HENT:  Positive for congestion and sore throat. Negative for ear pain, rhinorrhea, sinus pressure and sinus pain.   Eyes:  Negative for redness and visual disturbance.  Respiratory:  Negative for cough, chest tightness, shortness of breath and wheezing.   Cardiovascular:  Negative for chest pain and palpitations.  Gastrointestinal:  Negative for abdominal pain, constipation, diarrhea, nausea and vomiting.  Genitourinary:  Negative for dysuria, frequency and urgency.  Musculoskeletal:  Negative for myalgias.  Neurological:  Negative for dizziness, weakness and headaches.  Psychiatric/Behavioral:  Negative for confusion.   All other systems reviewed and are negative.    Physical Exam Triage Vital Signs ED Triage  Vitals  Encounter Vitals Group     BP 07/14/24 1428 (!) 146/78     Girls Systolic BP Percentile --      Girls Diastolic BP Percentile --      Boys Systolic BP Percentile --      Boys Diastolic BP Percentile --      Pulse Rate 07/14/24 1428 65     Resp 07/14/24 1428 18     Temp 07/14/24 1428 (!) 97.4 F (36.3 C)     Temp Source 07/14/24 1428 Oral     SpO2 07/14/24 1428 97 %     Weight --  Height --      Head Circumference --      Peak Flow --      Pain Score 07/14/24 1429 2     Pain Loc --      Pain Education --      Exclude from Growth Chart --    No data found.  Updated Vital Signs BP (!) 146/78 (BP Location: Right Arm)   Pulse 65   Temp (!) 97.4 F (36.3 C) (Oral)   Resp 18   SpO2 97%   Visual Acuity Right Eye Distance:   Left Eye Distance:   Bilateral Distance:    Right Eye Near:   Left Eye Near:    Bilateral Near:     Physical Exam Vitals reviewed.  Constitutional:      General: He is not in acute distress.    Appearance: Normal appearance. He is not ill-appearing.  HENT:     Head: Normocephalic and atraumatic.     Right Ear: Tympanic membrane, ear canal and external ear normal. No tenderness. No middle ear effusion. There is no impacted cerumen. Tympanic membrane is not perforated, erythematous, retracted or bulging.     Left Ear: Tympanic membrane, ear canal and external ear normal. No tenderness.  No middle ear effusion. There is no impacted cerumen. Tympanic membrane is not perforated, erythematous, retracted or bulging.     Nose: Nose normal. No congestion.     Mouth/Throat:     Mouth: Mucous membranes are moist.     Pharynx: Uvula midline. Posterior oropharyngeal erythema present. No oropharyngeal exudate.     Tonsils: 0 on the right. 0 on the left.     Comments: Posterior oropharyngeal erythema with cobblestoning. Eyes:     Extraocular Movements: Extraocular movements intact.     Pupils: Pupils are equal, round, and reactive to light.   Cardiovascular:     Rate and Rhythm: Normal rate and regular rhythm.     Heart sounds: Normal heart sounds.  Pulmonary:     Effort: Pulmonary effort is normal.     Breath sounds: Normal breath sounds. No decreased breath sounds, wheezing, rhonchi or rales.  Abdominal:     Palpations: Abdomen is soft.     Tenderness: There is no abdominal tenderness. There is no guarding or rebound.  Lymphadenopathy:     Cervical: No cervical adenopathy.     Right cervical: No superficial cervical adenopathy.    Left cervical: No superficial cervical adenopathy.  Neurological:     General: No focal deficit present.     Mental Status: He is alert and oriented to person, place, and time.  Psychiatric:        Mood and Affect: Mood normal.        Behavior: Behavior normal.        Thought Content: Thought content normal.        Judgment: Judgment normal.      UC Treatments / Results  Labs (all labs ordered are listed, but only abnormal results are displayed) Labs Reviewed  POCT RAPID STREP A (OFFICE) - Normal  POC SOFIA SARS ANTIGEN FIA - Normal    EKG   Radiology No results found.  Procedures Procedures (including critical care time)  Medications Ordered in UC Medications - No data to display  Initial Impression / Assessment and Plan / UC Course  I have reviewed the triage vital signs and the nursing notes.  Pertinent labs & imaging results that were available during my care of  the patient were reviewed by me and considered in my medical decision making (see chart for details).     Patient is an 88 year old male presenting with viral pharyngitis.  He is afebrile, nontachycardic.  Has not taken an antipyretic.  Rapid strep negative, culture sent.  Rapid COVID-negative.  He is stable to manage symptoms with over-the-counter Zyrtec, Afrin, Tylenol, good hydration, rest.  Final Clinical Impressions(s) / UC Diagnoses   Final diagnoses:  Viral syndrome     Discharge Instructions       -Your covid and strep tests are negative.  -You have a virus. -Zyrtec daily -Afrin nasal spray 1-2x daily while illness persists  -Tylenol for fevers   ED Prescriptions     Medication Sig Dispense Auth. Provider   oxymetazoline (AFRIN NASAL SPRAY) 0.05 % nasal spray Place 1 spray into both nostrils 2 (two) times daily. 30 mL Yenesis Even E, PA-C   cetirizine (ZYRTEC ALLERGY) 10 MG tablet Take 1 tablet (10 mg total) by mouth daily. 30 tablet Deigo Alonso E, PA-C      PDMP not reviewed this encounter.   Arlyss Leita BRAVO, PA-C 07/14/24 [phone]

## 2024-07-14 NOTE — Assessment & Plan Note (Signed)
 Diet controlled.

## 2024-07-14 NOTE — Assessment & Plan Note (Signed)
 Followed by urologist.

## 2024-07-14 NOTE — ED Triage Notes (Signed)
 Sore throat, runny nose x 1 day. Taking nyquil.

## 2024-07-14 NOTE — Assessment & Plan Note (Signed)
 Stable on nexium , continue with dietary modifications.

## 2024-07-14 NOTE — Discharge Instructions (Addendum)
-  Your covid and strep tests are negative.  -You have a virus. -Zyrtec daily -Afrin nasal spray 1-2x daily while illness persists  -Tylenol for fevers

## 2024-07-14 NOTE — Assessment & Plan Note (Signed)
 Progressive memory decline. Aricept  discontinued due to sleep interference. - Start Namenda with titration plan. - Monitor for dizziness or other side effects. - Prescribe Namenda 10 mg twice daily after titration.

## 2024-07-14 NOTE — Assessment & Plan Note (Signed)
 Completed GI workup. Continue lifestyle modifications

## 2024-07-17 ENCOUNTER — Ambulatory Visit (HOSPITAL_COMMUNITY): Payer: Self-pay

## 2024-07-17 LAB — CULTURE, GROUP A STREP (THRC)

## 2024-07-21 ENCOUNTER — Ambulatory Visit: Payer: Self-pay

## 2024-07-21 NOTE — Telephone Encounter (Signed)
 Noted thank you

## 2024-07-21 NOTE — Telephone Encounter (Signed)
 FYI Only or Action Required?: FYI only for provider.  Patient was last seen in primary care on 07/11/2024 by Caro Harlene POUR, NP.  Called Nurse Triage reporting Cough, Nasal Congestion, and Epistaxis.  Symptoms began 8-9 days ago.  Interventions attempted: Prescription medications: Afrin nasal spray, cetirizine; OTC medication: Nyquil.  Symptoms are: cough, nasal drainage with bloody mucous unchanged.  Triage Disposition: See Physician Within 24 Hours (overriding Home Care)  Patient/caregiver understands and will follow disposition?: Yes        Copied from CRM #[id number]. Topic: Clinical - Red Word Triage >> Jul 21, 2024 11:35 AM DeAngela L wrote: Red Word that prompted transfer to Nurse Triage: Patients wife calling cause the patient had a really bad cough and runny nose went to urgent care a week ago and prescribed cetirizine and now he is not gotten any better seems to be getting worse and now a bloody nose when he blows his nose has started in the last few days and cough has gotten worse really bad stomach pains when he coughs and not sure if he has had a fever   Bruna wife num  [phone] Reason for Disposition  Cough  Answer Assessment - Initial Assessment Questions 1. ONSET: When did the cough begin?      October 18/19th.  2. SEVERITY: How bad is the cough today?      Wife states the cough is usually better during the day but today seems worse. Abdomen hurts from coughing so much.  3. SPUTUM: Describe the color of your sputum (e.g., none, dry cough; clear, white, yellow, green)     Occasionally productive, mostly clear.  4. HEMOPTYSIS: Are you coughing up any blood? If Yes, ask: How much? (e.g., flecks, streaks, tablespoons, etc.)     No.  5. DIFFICULTY BREATHING: Are you having difficulty breathing? If Yes, ask: How bad is it? (e.g., mild, moderate, severe)      No.  6. FEVER: Do you have a fever? If Yes, ask: What is your temperature, how was  it measured, and when did it start?     No.  7. CARDIAC HISTORY: Do you have any history of heart disease? (e.g., heart attack, congestive heart failure)      HTN.  8. LUNG HISTORY: Do you have any history of lung disease?  (e.g., pulmonary embolus, asthma, emphysema)     No.  9. PE RISK FACTORS: Do you have a history of blood clots? (or: recent major surgery, recent prolonged travel, bedridden)     No.  10. OTHER SYMPTOMS: Do you have any other symptoms? (e.g., runny nose, wheezing, chest pain)       Bloody nasal discharge (not oozing), sore throat in the beginning and has improved.  11. PREGNANCY: Is there any chance you are pregnant? When was your last menstrual period?       N/A.  12. TRAVEL: Have you traveled out of the country in the last month? (e.g., travel history, exposures)       No.  Protocols used: Cough - Acute Productive-A-AH

## 2024-07-22 ENCOUNTER — Ambulatory Visit (INDEPENDENT_AMBULATORY_CARE_PROVIDER_SITE_OTHER)

## 2024-07-22 ENCOUNTER — Ambulatory Visit
Admission: RE | Admit: 2024-07-22 | Discharge: 2024-07-22 | Disposition: A | Discharge status: Home / Self Care | Payer: Self-pay | Attending: Nurse Practitioner | Admitting: Nurse Practitioner

## 2024-07-22 VITALS — BP 129/63 | HR 62 | Temp 98.2°F | Resp 20

## 2024-07-22 DIAGNOSIS — J069 Acute upper respiratory infection, unspecified: Secondary | ICD-10-CM | POA: Diagnosis not present

## 2024-07-22 DIAGNOSIS — R059 Cough, unspecified: Secondary | ICD-10-CM | POA: Diagnosis not present

## 2024-07-22 DIAGNOSIS — R052 Subacute cough: Secondary | ICD-10-CM

## 2024-07-22 MED ORDER — BENZONATATE 100 MG PO CAPS: 100.0000 mg | ORAL_CAPSULE | Freq: Three times a day (TID) | ORAL | 0 refills | Status: AC | PRN

## 2024-07-22 NOTE — Discharge Instructions (Addendum)
 Chest x-ray today does not show any pneumonia.  Your symptoms are consistent with ongoing viral infection.  A cough can last for up to 6 weeks after viral infection.  Start taking the Tessalon  Perles 3 times daily as needed for the cough.  Also recommend Mucinex 600 mg twice daily to help break up the congestion.  Seek care if symptoms persist or worsen despite treatment.

## 2024-07-22 NOTE — ED Provider Notes (Signed)
 [facility]    CSN: [id number] Arrival date & time: 07/22/24  0912      History   Chief Complaint Chief Complaint  Patient presents with   Cough    Entered by patient    HPI LINZY DARLING is a 88 y.o. male.   Presents today with wife for 2-week history of congested cough worse in the morning, upper epigastric pain when he coughs, nasal congestion worse in the morning, runny nose, sore throat, loose stools, and decreased appetite.  No fever, body aches or chills, wheezing, chest pain or tightness, chest congestion, sinus pressure headache, ear pain, abdominal pain, nausea, and vomiting.  No known sick contacts.  Has been using Astelin nasal spray, cetirizine, and NyQuil for symptoms with minimal temporary improvement.  Reports there is blood coming out of his nose in the morning and is concerning.    Past Medical History:  Diagnosis Date   Anxiety disorder, unspecified    Arthritis    Cancer (HCC)    prostate   Cataract    Edema    Erectile dysfunction    Glaucoma    History of blood in urine    History of cataract surgery 09/26/2015   Both Eyes by Dr.Hunt.   History of colonoscopy 09/26/2007   Dr.LeBaur   Indigestion    Knee pain    Pain in unspecified hip    Prostate cancer [facility])    Urinary incontinence     Patient Active Problem List   Diagnosis Date Noted   Primary osteoarthritis involving multiple joints 07/14/2024   Gastroesophageal reflux disease without esophagitis 07/14/2024   Mild neurocognitive disorder 07/14/2024   Essential hypertension 07/14/2024   Left-sided tinnitus 10/08/2023   Sensorineural hearing loss, bilateral 10/08/2023   History of prostate cancer 10/20/2020   Dysphagia 09/04/2019   Anxiety disorder, unspecified     Past Surgical History:  Procedure Laterality Date   CATARACT EXTRACTION W/PHACO Right 01/20/2016   Procedure: CATARACT EXTRACTION PHACO AND INTRAOCULAR LENS PLACEMENT (IOC);  Surgeon: Cherene Mania, MD;   Location: [facility];  Service: Ophthalmology;  Laterality: Right;  CDE 15.42   CATARACT EXTRACTION W/PHACO Left 03/06/2016   Procedure: CATARACT EXTRACTION PHACO AND INTRAOCULAR LENS PLACEMENT LEFT EYE;  CDE:  15.81;  Surgeon: Cherene Mania, MD;  Location: [facility];  Service: Ophthalmology;  Laterality: Left;   CHOLECYSTECTOMY  2011   COLONOSCOPY  07/03/2008   FRACTURE SURGERY  09/25/1966   [facility].    GALLBLADDER SURGERY  09/26/2007   Dr.Jenkins    HERNIA REPAIR Right 09/26/1991   Dr.Smith    PROSTATE SURGERY     cancer   SUPRAPUBIC PROSTATECTOMY  09/26/1995   Dr.Wrenn   VASECTOMY  09/25/1972   [facility].        Home Medications    Prior to Admission medications   Medication Sig Start Date End Date Taking? Authorizing Provider  benzonatate  (TESSALON ) 100 MG capsule Take 1 capsule (100 mg total) by mouth 3 (three) times daily as needed for cough. Do not take with alcohol or while operating or driving heavy machinery 89/71/74  Yes Chandra Raisin A, NP  acetaminophen (TYLENOL) 325 MG tablet Take 650 mg by mouth every 6 (six) hours as needed.    [provider]  calcium carbonate (TUMS EX) 750 MG chewable tablet Chew 1 tablet by mouth as needed for heartburn.    [provider]  cetirizine (ZYRTEC ALLERGY) 10 MG tablet Take 1 tablet (10  mg total) by mouth daily. 07/14/24   Graham, Laura E, PA-C  diclofenac Sodium (VOLTAREN) 1 % GEL Apply 4 g topically 2 (two) times daily as needed. Apply to right knee and ankle    [provider]  esomeprazole  (NEXIUM ) 40 MG capsule Take 1 capsule (40 mg total) by mouth 2 (two) times daily before a meal. 05/19/24   Avram Lupita BRAVO, MD  MAGNESIUM  PO Take 2 Doses by mouth at bedtime. 250 mg chewable total of 500 mg    [provider]  memantine (NAMENDA TITRATION PAK) tablet pack 5 mg/day for =1 week; 5 mg twice daily for =1 week; 15 mg/day given in 5 mg and 10 mg separated doses for =1 week; then 10 mg  twice daily 07/11/24   Eubanks, Latavion Halls K, NP  memantine (NAMENDA) 10 MG tablet Take 0.5 tablets (5 mg total) by mouth 2 (two) times daily. 08/11/24   Eubanks, Assia Meanor K, NP  Menthol, Topical Analgesic, (BIOFREEZE) 4 % GEL Apply topically daily as needed.    [provider]  oxymetazoline (AFRIN NASAL SPRAY) 0.05 % nasal spray Place 1 spray into both nostrils 2 (two) times daily. 07/14/24   Graham, Laura E, PA-C  vitamin B-12 (CYANOCOBALAMIN ) 1000 MCG tablet Take 1,000 mcg by mouth daily.    [provider]    Family History Family History  Problem Relation Age of Onset   Healthy Mother    Hypertension Mother    Dementia Mother    Heart disease Father    Dementia Father    Heart failure Father    Heart disease Brother    Stroke Brother    Hypertension Son    Stroke Brother     Social History Social History   Tobacco Use   Smoking status: Former    Current packs/day: 0.00    Average packs/day: 0.5 packs/day for 16.0 years (8.0 ttl pk-yrs)    Types: Cigarettes, Cigars    Start date: 01/18/1988    Quit date: 01/18/2004    Years since quitting: 20.5   Smokeless tobacco: Never  Vaping Use   Vaping status: Never Used  Substance Use Topics   Alcohol use: Yes    Alcohol/week: 1.0 standard drink of alcohol    Types: 1 Cans of beer per week    Comment: 1 beer week   Drug use: No     Allergies   Bovine (beef) protein-containing drug products   Review of Systems Review of Systems Per HPI  Physical Exam Triage Vital Signs ED Triage Vitals  Encounter Vitals Group     BP 07/22/24 0934 129/63     Girls Systolic BP Percentile --      Girls Diastolic BP Percentile --      Boys Systolic BP Percentile --      Boys Diastolic BP Percentile --      Pulse Rate 07/22/24 0934 62     Resp 07/22/24 0934 20     Temp 07/22/24 0934 98.2 F (36.8 C)     Temp Source 07/22/24 0934 Oral     SpO2 07/22/24 0934 97 %     Weight --      Height --      Head Circumference  --      Peak Flow --      Pain Score 07/22/24 0933 5     Pain Loc --      Pain Education --      Exclude from Growth Chart --  No data found.  Updated Vital Signs BP 129/63 (BP Location: Right Arm)   Pulse 62   Temp 98.2 F (36.8 C) (Oral)   Resp 20   SpO2 97%   Visual Acuity Right Eye Distance:   Left Eye Distance:   Bilateral Distance:    Right Eye Near:   Left Eye Near:    Bilateral Near:     Physical Exam Vitals and nursing note reviewed.  Constitutional:      General: He is not in acute distress.    Appearance: Normal appearance. He is not ill-appearing or toxic-appearing.  HENT:     Head: Normocephalic and atraumatic.     Right Ear: Tympanic membrane, ear canal and external ear normal.     Left Ear: Tympanic membrane, ear canal and external ear normal.     Nose: Congestion present. No rhinorrhea.     Mouth/Throat:     Mouth: Mucous membranes are moist.     Pharynx: Oropharynx is clear. Postnasal drip present. No oropharyngeal exudate or posterior oropharyngeal erythema.  Eyes:     General: No scleral icterus.    Extraocular Movements: Extraocular movements intact.  Cardiovascular:     Rate and Rhythm: Normal rate and regular rhythm.  Pulmonary:     Effort: Pulmonary effort is normal. No respiratory distress.     Breath sounds: Normal breath sounds. No wheezing, rhonchi or rales.  Musculoskeletal:     Cervical back: Normal range of motion and neck supple.  Lymphadenopathy:     Cervical: No cervical adenopathy.  Skin:    General: Skin is warm and dry.     Coloration: Skin is not jaundiced or pale.     Findings: No erythema or rash.  Neurological:     Mental Status: He is alert and oriented to person, place, and time.  Psychiatric:        Behavior: Behavior is cooperative.      UC Treatments / Results  Labs (all labs ordered are listed, but only abnormal results are displayed) Labs Reviewed - No data to display  EKG   Radiology DG Chest 2  View Result Date: 07/22/2024 EXAM: 2 VIEW(S) XRAY OF THE CHEST 07/22/2024 10:03:42 AM COMPARISON: None available. CLINICAL HISTORY: cough x 2 weeks FINDINGS: LUNGS AND PLEURA: No focal pulmonary opacity. No pulmonary edema. No pleural effusion. No pneumothorax. HEART AND MEDIASTINUM: No acute abnormality of the cardiac and mediastinal silhouettes. BONES AND SOFT TISSUES: Degenerative spine changes. Right upper quadrant surgical clips. No acute osseous abnormality. IMPRESSION: 1. No active cardiopulmonary disease. Electronically signed by: Norleen Kil MD 07/22/2024 10:18 AM EDT RP Workstation: [id number]    Procedures Procedures (including critical care time)  Medications Ordered in UC Medications - No data to display  Initial Impression / Assessment and Plan / UC Course  I have reviewed the triage vital signs and the nursing notes.  Pertinent labs & imaging results that were available during my care of the patient were reviewed by me and considered in my medical decision making (see chart for details).   Patient is well-appearing, normotensive, afebrile, not tachycardic, not tachypneic, oxygenating well on room air.   1. Subacute cough 2. Viral URI with cough Vitals and exam are reassuring today Viral testing deferred given length of symptoms Chest x-ray is negative for acute cardiopulmonary process Suspect ongoing viral cough Start cough suppressant medication, prescription sent to pharmacy ER and return precautions discussed  The patient was given the opportunity to ask questions.  All  questions answered to their satisfaction.  The patient is in agreement to this plan.   Final Clinical Impressions(s) / UC Diagnoses   Final diagnoses:  Subacute cough  Viral URI with cough     Discharge Instructions      Chest x-ray today does not show any pneumonia.  Your symptoms are consistent with ongoing viral infection.  A cough can last for up to 6 weeks after viral infection.  Start  taking the Tessalon  Perles 3 times daily as needed for the cough.  Also recommend Mucinex 600 mg twice daily to help break up the congestion.  Seek care if symptoms persist or worsen despite treatment.     ED Prescriptions     Medication Sig Dispense Auth. Provider   benzonatate  (TESSALON ) 100 MG capsule Take 1 capsule (100 mg total) by mouth 3 (three) times daily as needed for cough. Do not take with alcohol or while operating or driving heavy machinery 21 capsule Chandra Harlene LABOR, NP      PDMP not reviewed this encounter.   Chandra Harlene LABOR, NP 07/22/24 1346

## 2024-07-22 NOTE — ED Triage Notes (Signed)
 Pt reports she has a cough with some mucus  x 1 week. Reports pain in  upper right abdomen when coughing. States it worsens first thing in the morning    Took certizine, nyquil, and  flonase but no relief

## 2024-07-28 ENCOUNTER — Encounter: Payer: Self-pay | Admitting: Radiology

## 2024-08-06 ENCOUNTER — Other Ambulatory Visit: Payer: Self-pay

## 2024-08-06 ENCOUNTER — Ambulatory Visit: Admitting: Surgical

## 2024-08-06 DIAGNOSIS — M25571 Pain in right ankle and joints of right foot: Secondary | ICD-10-CM

## 2024-08-06 DIAGNOSIS — G8929 Other chronic pain: Secondary | ICD-10-CM | POA: Diagnosis not present

## 2024-08-06 DIAGNOSIS — M25561 Pain in right knee: Secondary | ICD-10-CM | POA: Diagnosis not present

## 2024-08-06 DIAGNOSIS — M170 Bilateral primary osteoarthritis of knee: Secondary | ICD-10-CM

## 2024-08-10 MED ORDER — BUPIVACAINE HCL 0.25 % IJ SOLN
4.0000 mL | INTRAMUSCULAR | Status: AC | PRN
  Administered 2024-08-06: 4 mL via INTRA_ARTICULAR

## 2024-08-10 MED ORDER — TRIAMCINOLONE ACETONIDE 40 MG/ML IJ SUSP
40.0000 mg | INTRAMUSCULAR | Status: AC | PRN
  Administered 2024-08-06: 40 mg via INTRA_ARTICULAR

## 2024-08-10 MED ORDER — LIDOCAINE HCL 1 % IJ SOLN
5.0000 mL | INTRAMUSCULAR | Status: AC | PRN
  Administered 2024-08-06: 5 mL
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
## 2024-10-06 NOTE — Progress Notes (Unsigned)
 "  NEUROLOGY FOLLOW UP OFFICE NOTE  Kevin [name] [id number]  Assessment/Plan:   Episodic tension type headache, not intractable, stable Mild neurocognitive disorder, stable Episodic visual disturbance - consider ocular migraines     Would monitor memory for now. Follow up 1 year     Subjective:  Kevin [name] is [name] 89 year old male with arthritis, anxiety, cataract, memory deficits and history of prostate cancer who follows up for headaches and MCI.  Accompanied by his wife who supplements history.  UPDATE: ***   HISTORY: Neurocognitive disorder: He reports short-term memory problems since 2023 and progressively getting worse.  He will often walk into a room and forget what he wanted to get.  He may forget recent conversations with people.  Sometimes he may forget the name of people he knows.  He is independent.  He doesn't drive much but only locally and without disorientation.  He pays the bills without difficulty.  He performs all activities of daily living independently.  Denies formed hallucinations.  Denies nightmares but sometimes wakes up irritated by a dream. His father had some memory problems but was never diagnosed with dementia. TSH 1.22, non-reactive RPR, B12 273. He was advised to start OTC B12 1000mcg daily which he took for awhile but then stopped.  He was prescribed donepezil  but stopped it himself because he thought it interfered with his sleeping.  Repeat B12 from 11/24/2022 was 589.  TSH from 01/01/2023 was 1.18.  MRI of brain without contrast on 12/24/2022  revealed age-related volume loss and minimal chronic small vessel ischemic changes in the cerebral hemispheres but overall unremarkable.    Tension-type headache:  For several years, he has had headaches.  They are usually left frontal (sometimes spreads to bilateral) 5/10 pressure pain.  Associated photophobia but no nausea, vomiting, phonophobia, or visual disturbance.  Lasts about [name] hour with Tylenol.   Occurs 2 to 3 days a week.     Visual disturbance - query ocular migraines: For a few years, he also has had recurrent episodes of visual disturbance.  It usually occurs while watching TV.  He will suddenly see a dark pattern (looks like a hand) rotating in the left half of his visual field in both eyes.  Typically lasts up to 30 minutes.  No associated headache.  Occurs once a week.  He has history of cataract surgery.  Has not had [name] eye exam in several years.    Dizziness: Had been experiencing dizziness with hearing loss and left ear tinnitus.  Seen in ED on 08/15/2023.  CT head personally reviewed revealed age related volume loss and minimal chronic small vessel ischemic changes but no acute findings.  Evaluated by ENT.  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   Past medications: Gabapentin  (agitation)  PAST MEDICAL HISTORY: Past Medical History:  Diagnosis Date   Anxiety disorder, unspecified    Arthritis    Cancer (HCC)    prostate   Cataract    Edema    Erectile dysfunction    Glaucoma    History of blood in urine    History of cataract surgery 09/26/2015   Both Eyes by Dr.Hunt.   History of colonoscopy 09/26/2007   Dr.LeBaur   Indigestion    Knee pain    Pain in unspecified hip    Prostate cancer [facility])    Urinary incontinence     MEDICATIONS: Current Outpatient Medications on File Prior to Visit  Medication Sig Dispense Refill   acetaminophen (TYLENOL) 325 MG tablet Take 650 mg by mouth every 6 (six) hours as needed.     benzonatate  (TESSALON ) 100 MG capsule Take 1 capsule (100 mg total) by mouth 3 (three) times daily as needed for cough. Do not take with alcohol or while operating or driving heavy machinery 21 capsule 0   calcium carbonate (TUMS EX) 750 MG chewable tablet Chew 1 tablet by mouth as needed for heartburn.     cetirizine  (ZYRTEC  ALLERGY) 10 MG tablet Take 1 tablet (10 mg total) by mouth daily. 30  tablet 2   diclofenac Sodium (VOLTAREN) 1 % GEL Apply 4 g topically 2 (two) times daily as needed. Apply to right knee and ankle     esomeprazole  (NEXIUM ) 40 MG capsule Take 1 capsule (40 mg total) by mouth 2 (two) times daily before a meal. 60 capsule 5   MAGNESIUM  PO Take 2 Doses by mouth at bedtime. 250 mg chewable total of 500 mg     memantine  (NAMENDA  TITRATION PAK) tablet pack 5 mg/day for =1 week; 5 mg twice daily for =1 week; 15 mg/day given in 5 mg and 10 mg separated doses for =1 week; then 10 mg twice daily 49 tablet 12   memantine  (NAMENDA ) 10 MG tablet Take 0.5 tablets (5 mg total) by mouth 2 (two) times daily. 30 tablet 1   Menthol, Topical Analgesic, (BIOFREEZE) 4 % GEL Apply topically daily as needed.     oxymetazoline  (AFRIN NASAL SPRAY) 0.05 % nasal spray Place 1 spray into both nostrils 2 (two) times daily. 30 mL 0   vitamin B-12 (CYANOCOBALAMIN ) 1000 MCG tablet Take 1,000 mcg by mouth daily.     No current facility-administered medications on file prior to visit.    ALLERGIES: Allergies  Allergen Reactions   Bovine (Beef) Protein-Containing Drug Products Nausea And Vomiting    FAMILY HISTORY: Family History  Problem Relation Age of Onset   Healthy Mother    Hypertension Mother    Dementia Mother    Heart disease Father    Dementia Father    Heart failure Father    Heart disease Brother    Stroke Brother    Hypertension Son    Stroke Brother       Objective:  *** General: No acute distress.  Patient appears well-groomed.   Head:  Normocephalic/atraumatic Neck:  Supple.  No paraspinal tenderness.  Full range of motion. Heart:  Regular rate and rhythm. Neuro:  Alert and oriented.  Speech fluent and not dysarthric.  Language intact.   MoCA *** CN II-XII intact.  Bulk and tone normal.  Muscle strength 5/5 throughout.  Sensation to light touch intact.  Deep tendon reflexes 2+ throughout, toes downgoing.  Gait normal.  Romberg negative.       Kevin Dunnings,  DO  CC: Kevin An, NP       "

## 2024-10-07 ENCOUNTER — Encounter: Payer: Self-pay | Admitting: Neurology

## 2024-10-07 ENCOUNTER — Ambulatory Visit: Payer: Medicare HMO | Admitting: Neurology

## 2024-10-07 VITALS — BP 151/63 | HR 58 | Ht 67.0 in | Wt 180.0 lb

## 2024-10-07 DIAGNOSIS — G44219 Episodic tension-type headache, not intractable: Secondary | ICD-10-CM

## 2024-10-07 DIAGNOSIS — G3184 Mild cognitive impairment, so stated: Secondary | ICD-10-CM | POA: Diagnosis not present

## 2024-10-07 MED ORDER — DULOXETINE HCL 30 MG PO CPEP: 30.0000 mg | ORAL_CAPSULE | Freq: Every day | ORAL | 5 refills | Status: AC

## 2024-10-07 NOTE — Patient Instructions (Signed)
 To treat headache and depression, start DULOXETINE  30MG  DAILY.  If no improvement in 8 weeks, contact me Limit use of pain relievers (such as Tylenol) to no more than 9 days out of the month to prevent risk of rebound or medication-overuse headache. Keep headache diary Follow up 6 months.

## 2025-01-08 ENCOUNTER — Other Ambulatory Visit

## 2025-01-09 ENCOUNTER — Ambulatory Visit: Payer: Self-pay | Admitting: Nurse Practitioner

## 2025-01-16 ENCOUNTER — Ambulatory Visit: Admitting: Urology

## 2025-03-17 ENCOUNTER — Ambulatory Visit: Payer: Self-pay | Admitting: Nurse Practitioner

## 2025-04-20 ENCOUNTER — Ambulatory Visit: Payer: Self-pay | Admitting: Neurology
